# Patient Record
Sex: Male | Born: 1935 | Race: White | Hispanic: No | Marital: Married | Smoking: Never smoker
Health system: Southern US, Community
[De-identification: ages and names within clinical notes are randomized; demographics above are authoritative.]

## PROBLEM LIST (undated history)

## (undated) DIAGNOSIS — C4491 Basal cell carcinoma of skin, unspecified: Secondary | ICD-10-CM

## (undated) DIAGNOSIS — Z87442 Personal history of urinary calculi: Secondary | ICD-10-CM

## (undated) DIAGNOSIS — Z95810 Presence of automatic (implantable) cardiac defibrillator: Secondary | ICD-10-CM

## (undated) DIAGNOSIS — I63139 Cerebral infarction due to embolism of unspecified carotid artery: Secondary | ICD-10-CM

## (undated) DIAGNOSIS — R7303 Prediabetes: Secondary | ICD-10-CM

## (undated) DIAGNOSIS — E785 Hyperlipidemia, unspecified: Secondary | ICD-10-CM

## (undated) DIAGNOSIS — I420 Dilated cardiomyopathy: Secondary | ICD-10-CM

## (undated) DIAGNOSIS — I2510 Atherosclerotic heart disease of native coronary artery without angina pectoris: Secondary | ICD-10-CM

## (undated) DIAGNOSIS — I447 Left bundle-branch block, unspecified: Secondary | ICD-10-CM

## (undated) DIAGNOSIS — I252 Old myocardial infarction: Secondary | ICD-10-CM

## (undated) DIAGNOSIS — I10 Essential (primary) hypertension: Secondary | ICD-10-CM

## (undated) DIAGNOSIS — C9110 Chronic lymphocytic leukemia of B-cell type not having achieved remission: Secondary | ICD-10-CM

## (undated) HISTORY — PX: CORONARY ANGIOPLASTY: SHX604

## (undated) HISTORY — DX: Old myocardial infarction: I25.2

## (undated) HISTORY — DX: Essential (primary) hypertension: I10

## (undated) HISTORY — DX: Presence of automatic (implantable) cardiac defibrillator: Z95.810

## (undated) HISTORY — DX: Cerebral infarction due to embolism of unspecified carotid artery: I63.139

## (undated) HISTORY — PX: OTHER SURGICAL HISTORY: SHX169

## (undated) HISTORY — DX: Hyperlipidemia, unspecified: E78.5

## (undated) HISTORY — DX: Prediabetes: R73.03

## (undated) HISTORY — PX: JOINT REPLACEMENT: SHX530

## (undated) HISTORY — PX: CATARACT EXTRACTION W/ INTRAOCULAR LENS  IMPLANT, BILATERAL: SHX1307

## (undated) HISTORY — PX: REPLACEMENT TOTAL KNEE: SUR1224

## (undated) HISTORY — DX: Dilated cardiomyopathy: I42.0

## (undated) HISTORY — DX: Left bundle-branch block, unspecified: I44.7

## (undated) HISTORY — DX: Chronic lymphocytic leukemia of B-cell type not having achieved remission: C91.10

## (undated) HISTORY — PX: BASAL CELL CARCINOMA EXCISION: SHX1214

## (undated) HISTORY — DX: Atherosclerotic heart disease of native coronary artery without angina pectoris: I25.10

---

## 1999-12-20 ENCOUNTER — Encounter: Payer: Self-pay | Admitting: Orthopedic Surgery

## 1999-12-20 ENCOUNTER — Ambulatory Visit (HOSPITAL_COMMUNITY): Admission: RE | Admit: 1999-12-20 | Discharge: 1999-12-20 | Payer: Self-pay | Admitting: Orthopedic Surgery

## 1999-12-25 ENCOUNTER — Ambulatory Visit (HOSPITAL_COMMUNITY): Admission: RE | Admit: 1999-12-25 | Discharge: 1999-12-25 | Payer: Self-pay | Admitting: Orthopedic Surgery

## 2007-02-25 ENCOUNTER — Ambulatory Visit: Payer: Self-pay | Admitting: Cardiovascular Disease

## 2007-03-10 ENCOUNTER — Ambulatory Visit: Payer: Self-pay

## 2007-03-10 ENCOUNTER — Encounter: Payer: Self-pay | Admitting: Cardiovascular Disease

## 2009-09-14 DIAGNOSIS — C9110 Chronic lymphocytic leukemia of B-cell type not having achieved remission: Secondary | ICD-10-CM

## 2009-09-14 HISTORY — DX: Chronic lymphocytic leukemia of B-cell type not having achieved remission: C91.10

## 2009-09-20 ENCOUNTER — Ambulatory Visit: Payer: Self-pay | Admitting: Oncology

## 2009-09-26 ENCOUNTER — Other Ambulatory Visit: Admission: RE | Admit: 2009-09-26 | Discharge: 2009-09-26 | Payer: Self-pay | Admitting: Oncology

## 2009-09-26 LAB — CBC WITH DIFFERENTIAL/PLATELET
BASO%: 0.3 % (ref 0.0–2.0)
Basophils Absolute: 0.1 10*3/uL (ref 0.0–0.1)
EOS%: 1 % (ref 0.0–7.0)
Eosinophils Absolute: 0.2 10*3/uL (ref 0.0–0.5)
HCT: 37.7 % — ABNORMAL LOW (ref 38.4–49.9)
HGB: 13 g/dL (ref 13.0–17.1)
LYMPH%: 77.4 % — ABNORMAL HIGH (ref 14.0–49.0)
MCH: 32.8 pg (ref 27.2–33.4)
MCHC: 34.5 g/dL (ref 32.0–36.0)
MCV: 95.1 fL (ref 79.3–98.0)
MONO#: 0.5 10*3/uL (ref 0.1–0.9)
MONO%: 2.6 % (ref 0.0–14.0)
NEUT#: 3.5 10*3/uL (ref 1.5–6.5)
NEUT%: 18.7 % — ABNORMAL LOW (ref 39.0–75.0)
Platelets: 153 10*3/uL (ref 140–400)
RBC: 3.97 10*6/uL — ABNORMAL LOW (ref 4.20–5.82)
RDW: 12.8 % (ref 11.0–14.6)
WBC: 18.8 10*3/uL — ABNORMAL HIGH (ref 4.0–10.3)
lymph#: 14.5 10*3/uL — ABNORMAL HIGH (ref 0.9–3.3)

## 2009-09-26 LAB — CHCC SMEAR

## 2009-09-26 LAB — COMPREHENSIVE METABOLIC PANEL
AST: 22 U/L (ref 0–37)
BUN: 13 mg/dL (ref 6–23)
CO2: 27 mEq/L (ref 19–32)
Calcium: 8.3 mg/dL — ABNORMAL LOW (ref 8.4–10.5)
Chloride: 108 mEq/L (ref 96–112)
Creatinine, Ser: 0.88 mg/dL (ref 0.40–1.50)
Total Bilirubin: 0.6 mg/dL (ref 0.3–1.2)

## 2009-09-26 LAB — MORPHOLOGY
PLT EST: ADEQUATE
RBC Comments: NORMAL

## 2009-09-26 LAB — LACTATE DEHYDROGENASE: LDH: 128 U/L (ref 94–250)

## 2009-10-27 ENCOUNTER — Ambulatory Visit: Payer: Self-pay | Admitting: Oncology

## 2009-10-31 LAB — TECHNOLOGIST REVIEW

## 2009-10-31 LAB — COMPREHENSIVE METABOLIC PANEL
ALT: 18 U/L (ref 0–53)
AST: 22 U/L (ref 0–37)
Albumin: 3.5 g/dL (ref 3.5–5.2)
Alkaline Phosphatase: 67 U/L (ref 39–117)
BUN: 13 mg/dL (ref 6–23)
CO2: 27 mEq/L (ref 19–32)
Calcium: 8.5 mg/dL (ref 8.4–10.5)
Chloride: 109 mEq/L (ref 96–112)
Creatinine, Ser: 0.92 mg/dL (ref 0.40–1.50)
Glucose, Bld: 95 mg/dL (ref 70–99)
Potassium: 4.3 mEq/L (ref 3.5–5.3)
Sodium: 142 mEq/L (ref 135–145)
Total Bilirubin: 0.8 mg/dL (ref 0.3–1.2)
Total Protein: 5.7 g/dL — ABNORMAL LOW (ref 6.0–8.3)

## 2009-10-31 LAB — CBC WITH DIFFERENTIAL/PLATELET
BASO%: 0.2 % (ref 0.0–2.0)
Basophils Absolute: 0 10*3/uL (ref 0.0–0.1)
EOS%: 1.2 % (ref 0.0–7.0)
Eosinophils Absolute: 0.2 10*3/uL (ref 0.0–0.5)
HCT: 37.5 % — ABNORMAL LOW (ref 38.4–49.9)
HGB: 12.9 g/dL — ABNORMAL LOW (ref 13.0–17.1)
LYMPH%: 78.2 % — ABNORMAL HIGH (ref 14.0–49.0)
MCH: 33 pg (ref 27.2–33.4)
MCHC: 34.4 g/dL (ref 32.0–36.0)
MCV: 95.8 fL (ref 79.3–98.0)
MONO#: 0.6 10*3/uL (ref 0.1–0.9)
MONO%: 2.9 % (ref 0.0–14.0)
NEUT#: 3.4 10*3/uL (ref 1.5–6.5)
NEUT%: 17.5 % — ABNORMAL LOW (ref 39.0–75.0)
Platelets: 147 10*3/uL (ref 140–400)
RBC: 3.92 10*6/uL — ABNORMAL LOW (ref 4.20–5.82)
RDW: 12.6 % (ref 11.0–14.6)
WBC: 19.4 10*3/uL — ABNORMAL HIGH (ref 4.0–10.3)
lymph#: 15.2 10*3/uL — ABNORMAL HIGH (ref 0.9–3.3)

## 2009-10-31 LAB — LACTATE DEHYDROGENASE: LDH: 145 U/L (ref 94–250)

## 2009-12-28 ENCOUNTER — Ambulatory Visit: Payer: Self-pay | Admitting: Oncology

## 2010-01-02 LAB — CBC WITH DIFFERENTIAL/PLATELET
BASO%: 0.3 % (ref 0.0–2.0)
Basophils Absolute: 0.1 10*3/uL (ref 0.0–0.1)
EOS%: 0.5 % (ref 0.0–7.0)
Eosinophils Absolute: 0.1 10*3/uL (ref 0.0–0.5)
HCT: 40.8 % (ref 38.4–49.9)
HGB: 13.7 g/dL (ref 13.0–17.1)
LYMPH%: 82 % — ABNORMAL HIGH (ref 14.0–49.0)
MCH: 32.3 pg (ref 27.2–33.4)
MCHC: 33.5 g/dL (ref 32.0–36.0)
MCV: 96.3 fL (ref 79.3–98.0)
MONO#: 0.7 10*3/uL (ref 0.1–0.9)
MONO%: 2.9 % (ref 0.0–14.0)
NEUT#: 3.3 10*3/uL (ref 1.5–6.5)
NEUT%: 14.3 % — ABNORMAL LOW (ref 39.0–75.0)
Platelets: 146 10*3/uL (ref 140–400)
RBC: 4.24 10*6/uL (ref 4.20–5.82)
RDW: 12.9 % (ref 11.0–14.6)
WBC: 22.8 10*3/uL — ABNORMAL HIGH (ref 4.0–10.3)
lymph#: 18.7 10*3/uL — ABNORMAL HIGH (ref 0.9–3.3)

## 2010-02-23 ENCOUNTER — Ambulatory Visit: Payer: Self-pay | Admitting: Oncology

## 2010-02-27 LAB — CBC WITH DIFFERENTIAL/PLATELET
Eosinophils Absolute: 0.1 10*3/uL (ref 0.0–0.5)
HGB: 13.6 g/dL (ref 13.0–17.1)
MONO#: 0.8 10*3/uL (ref 0.1–0.9)
NEUT#: 4 10*3/uL (ref 1.5–6.5)
RBC: 4.14 10*6/uL — ABNORMAL LOW (ref 4.20–5.82)
RDW: 13 % (ref 11.0–14.6)
WBC: 25.1 10*3/uL — ABNORMAL HIGH (ref 4.0–10.3)

## 2010-02-27 LAB — COMPREHENSIVE METABOLIC PANEL
Albumin: 4 g/dL (ref 3.5–5.2)
CO2: 28 mEq/L (ref 19–32)
Calcium: 9.1 mg/dL (ref 8.4–10.5)
Chloride: 106 mEq/L (ref 96–112)
Glucose, Bld: 93 mg/dL (ref 70–99)
Potassium: 4.4 mEq/L (ref 3.5–5.3)
Sodium: 142 mEq/L (ref 135–145)
Total Protein: 6.1 g/dL (ref 6.0–8.3)

## 2010-02-27 LAB — LACTATE DEHYDROGENASE: LDH: 139 U/L (ref 94–250)

## 2010-04-27 ENCOUNTER — Ambulatory Visit: Payer: Self-pay | Admitting: Oncology

## 2010-05-01 LAB — CBC WITH DIFFERENTIAL/PLATELET
BASO%: 0.2 % (ref 0.0–2.0)
Basophils Absolute: 0.1 10*3/uL (ref 0.0–0.1)
EOS%: 0.8 % (ref 0.0–7.0)
Eosinophils Absolute: 0.2 10*3/uL (ref 0.0–0.5)
HCT: 41.1 % (ref 38.4–49.9)
HGB: 14.2 g/dL (ref 13.0–17.1)
LYMPH%: 81.1 % — ABNORMAL HIGH (ref 14.0–49.0)
MCH: 32.9 pg (ref 27.2–33.4)
MCHC: 34.5 g/dL (ref 32.0–36.0)
MCV: 95.3 fL (ref 79.3–98.0)
MONO#: 0.8 10*3/uL (ref 0.1–0.9)
MONO%: 3.1 % (ref 0.0–14.0)
NEUT#: 3.7 10*3/uL (ref 1.5–6.5)
NEUT%: 14.8 % — ABNORMAL LOW (ref 39.0–75.0)
Platelets: 170 10*3/uL (ref 140–400)
RBC: 4.31 10*6/uL (ref 4.20–5.82)
RDW: 13 % (ref 11.0–14.6)
WBC: 25 10*3/uL — ABNORMAL HIGH (ref 4.0–10.3)
lymph#: 20.3 10*3/uL — ABNORMAL HIGH (ref 0.9–3.3)

## 2010-06-26 ENCOUNTER — Encounter (HOSPITAL_BASED_OUTPATIENT_CLINIC_OR_DEPARTMENT_OTHER): Payer: Medicare Other | Admitting: Oncology

## 2010-06-26 ENCOUNTER — Other Ambulatory Visit: Payer: Self-pay | Admitting: Oncology

## 2010-06-26 DIAGNOSIS — D72829 Elevated white blood cell count, unspecified: Secondary | ICD-10-CM

## 2010-06-26 DIAGNOSIS — C9110 Chronic lymphocytic leukemia of B-cell type not having achieved remission: Secondary | ICD-10-CM

## 2010-06-26 LAB — COMPREHENSIVE METABOLIC PANEL
ALT: 18 U/L (ref 0–53)
Alkaline Phosphatase: 72 U/L (ref 39–117)
CO2: 28 mEq/L (ref 19–32)
Sodium: 142 mEq/L (ref 135–145)
Total Bilirubin: 0.5 mg/dL (ref 0.3–1.2)
Total Protein: 5.8 g/dL — ABNORMAL LOW (ref 6.0–8.3)

## 2010-06-26 LAB — CBC WITH DIFFERENTIAL/PLATELET
BASO%: 0.2 % (ref 0.0–2.0)
LYMPH%: 81.1 % — ABNORMAL HIGH (ref 14.0–49.0)
MCHC: 33.6 g/dL (ref 32.0–36.0)
MONO#: 0.7 10*3/uL (ref 0.1–0.9)
Platelets: 162 10*3/uL (ref 140–400)
RBC: 4.07 10*6/uL — ABNORMAL LOW (ref 4.20–5.82)
WBC: 23.3 10*3/uL — ABNORMAL HIGH (ref 4.0–10.3)

## 2010-06-26 LAB — LACTATE DEHYDROGENASE: LDH: 151 U/L (ref 94–250)

## 2010-08-29 NOTE — Assessment & Plan Note (Signed)
[facility]                            CARDIOLOGY OFFICE NOTE   Gregory, Rodgers                       MRN:          [id number]  DATE:02/25/2007                            DOB:          1928-03-30    SUBJECTIVE:  Gregory Rodgers is seen today at the request of Dr. Scarlett Presto in  [place], as well as Dr. Konrad Felix.  He needs preoperative clearance  for left knee surgery, which is scheduled for April 30, 2007.   Gregory Rodgers has been seen by Gregory Rodgers in the past.  In 1988, he had an anterior  wall myocardial infarction and had an angioplasty of a diagonal branch  by Dr. Everardo Beals. [place].  In 2002, was his last Myoview, which showed a  small apical defect, but no ischemia.  Since that time, he has not been  seen up here by Gregory Rodgers.  He has been doing well.  He is a Theme park manager.  He is active.  He has not had any significant chest pain, PND or  orthopnea.  No syncope or palpitations.   The patient needs left knee surgery.  He has significant osteoarthritis.  In talking to the patient, he really has no cardiopulmonary limitations.  He is active both on the farm, and he likes to hunt with his sons.   REVIEW OF SYSTEMS:  Is otherwise negative.   PAST MEDICAL HISTORY:  1. Remarkably benign. His past medical history is otherwise remarkable      for hypercholesterolemia, on therapy.  2. A previous anterior wall myocardial infarction in 1988.   SOCIAL HISTORY:  He is a previous smoker, quitting in 88.  He does not  drink.  He drinks two caffeinated beverages a day.  As indicated, he is  a Theme park manager.  He walks two to three miles a day.  The patient  enjoys hunting.   PAST SURGICAL HISTORY:  His only past surgical history has included  cataract surgery.   FAMILY HISTORY:  His mother died at age 47, of congestive heart failure.  His father died at age 23, of a heart attack.   CURRENT MEDICATIONS:  1. Simvastatin 10 mg daily.  2. Dietary supplement for vision  formula.  3. Aspirin.  4. Fish oil.  5. Omega-3 vitamins.   ALLERGIES:  No known drug allergies.   PHYSICAL EXAMINATION:  GENERAL:  A healthy-appearing elderly white male,  in no distress.  VITAL SIGNS:  Weight 185 pounds, blood pressure 150/76, pulse 56 and  regular, respirations 14, afebrile.  HEENT:  Unremarkable.  NECK:  Carotids normal without bruit.  No lymphadenopathy, thyromegaly  or JVP elevation.  LUNGS:  Clear with good diaphragmatic motion.  No wheezing.  HEART:  S1 and S2.  No murmur, rub, gallop or click.  PMI is normal.  ABDOMEN:  Benign.  Bowel sounds positive.  No tenderness, no  hepatosplenomegaly, no hepatojugular reflux.  EXTREMITIES:  Distal pulses intact.  No edema.  NEUROLOGIC:  Nonfocal.  SKIN:  Warm and dry.  MUSCULOSKELETAL:  No muscular weakness.   Electrocardiogram shows a left bundle branch block.  It  is new since our  electrocardiogram from 2002.   IMPRESSION/PLAN:  1. Elderly white male with a history of an anterior wall myocardial      infarction, diagonal angioplasty:  No workup in over six years.  An      electrocardiogram is different from that seen in 2002.  Despite his      activity level, I think he needs an adenosine Myoview to clear him      for surgery.  We will schedule this.  2. New left bundle branch block, likely resulting from primary      electrical problem:  No evidence of a high-grade atrioventricular      block.  Not on atrioventricular nodal blocking drugs.  No syncope.      Check a 2-D echocardiogram to make sure this is not representing a      cardiomyopathy.  Reassess his LV function prior to surgery.  3. Osteoarthritis of the left knee:  Probably will be cleared for      surgery with Dr. Scarlett Presto in [place]; but particularly since we      are not there, and it is a small hospital, I would like to make      sure his adenosine Myoview and echocardiogram are low risk before      we clear him for surgery on April 30, 2007.  4. Hypercholesterolemia in the setting of a previous myocardial      infarction:  Follow up with Dr. Konrad Felix in regards to lipid and      liver profile.  Continue Simvastatin 10 mg daily.   Overall, I suspect that Kanton will be able to be cleared for surgery.  Further recommendations will be based on the results of his stress test  and echocardiogram.     Theron Arista C. Eden Emms, MD, [facility]  Electronically Signed    PCN/MedQ  DD: 02/25/2007  DT: 02/25/2007  Job #: [id number]

## 2010-10-13 ENCOUNTER — Other Ambulatory Visit: Payer: Self-pay | Admitting: Oncology

## 2010-10-13 ENCOUNTER — Encounter (HOSPITAL_BASED_OUTPATIENT_CLINIC_OR_DEPARTMENT_OTHER): Payer: Medicare Other | Admitting: Oncology

## 2010-10-13 DIAGNOSIS — C9110 Chronic lymphocytic leukemia of B-cell type not having achieved remission: Secondary | ICD-10-CM

## 2010-10-13 LAB — CBC WITH DIFFERENTIAL/PLATELET
BASO%: 0.2 % (ref 0.0–2.0)
EOS%: 0.9 % (ref 0.0–7.0)
HCT: 37.3 % — ABNORMAL LOW (ref 38.4–49.9)
LYMPH%: 84.1 % — ABNORMAL HIGH (ref 14.0–49.0)
MCH: 33.3 pg (ref 27.2–33.4)
MCHC: 34.6 g/dL (ref 32.0–36.0)
MCV: 96.3 fL (ref 79.3–98.0)
MONO%: 1.9 % (ref 0.0–14.0)
NEUT%: 12.9 % — ABNORMAL LOW (ref 39.0–75.0)
lymph#: 19 10*3/uL — ABNORMAL HIGH (ref 0.9–3.3)

## 2011-01-01 ENCOUNTER — Other Ambulatory Visit: Payer: Self-pay | Admitting: Oncology

## 2011-01-01 ENCOUNTER — Encounter (HOSPITAL_BASED_OUTPATIENT_CLINIC_OR_DEPARTMENT_OTHER): Payer: Medicare Other | Admitting: Oncology

## 2011-01-01 DIAGNOSIS — C9110 Chronic lymphocytic leukemia of B-cell type not having achieved remission: Secondary | ICD-10-CM

## 2011-01-01 DIAGNOSIS — E785 Hyperlipidemia, unspecified: Secondary | ICD-10-CM

## 2011-01-01 DIAGNOSIS — D696 Thrombocytopenia, unspecified: Secondary | ICD-10-CM

## 2011-01-01 DIAGNOSIS — I10 Essential (primary) hypertension: Secondary | ICD-10-CM

## 2011-01-01 LAB — CBC WITH DIFFERENTIAL/PLATELET
BASO%: 0.2 % (ref 0.0–2.0)
EOS%: 0.4 % (ref 0.0–7.0)
HGB: 13.7 g/dL (ref 13.0–17.1)
MCH: 33.8 pg — ABNORMAL HIGH (ref 27.2–33.4)
MCHC: 34.7 g/dL (ref 32.0–36.0)
MCV: 97.4 fL (ref 79.3–98.0)
MONO%: 2.5 % (ref 0.0–14.0)
RBC: 4.06 10*6/uL — ABNORMAL LOW (ref 4.20–5.82)
RDW: 12.9 % (ref 11.0–14.6)
lymph#: 20.3 10*3/uL — ABNORMAL HIGH (ref 0.9–3.3)

## 2011-01-01 LAB — COMPREHENSIVE METABOLIC PANEL
AST: 21 U/L (ref 0–37)
Albumin: 4 g/dL (ref 3.5–5.2)
Alkaline Phosphatase: 63 U/L (ref 39–117)
Potassium: 4.7 mEq/L (ref 3.5–5.3)
Sodium: 138 mEq/L (ref 135–145)
Total Bilirubin: 0.5 mg/dL (ref 0.3–1.2)
Total Protein: 5.9 g/dL — ABNORMAL LOW (ref 6.0–8.3)

## 2011-01-01 LAB — TECHNOLOGIST REVIEW

## 2011-03-30 ENCOUNTER — Other Ambulatory Visit: Payer: Self-pay | Admitting: Oncology

## 2011-03-30 ENCOUNTER — Other Ambulatory Visit (HOSPITAL_BASED_OUTPATIENT_CLINIC_OR_DEPARTMENT_OTHER): Payer: Medicare Other | Admitting: Lab

## 2011-03-30 ENCOUNTER — Telehealth: Payer: Self-pay | Admitting: *Deleted

## 2011-03-30 DIAGNOSIS — C9110 Chronic lymphocytic leukemia of B-cell type not having achieved remission: Secondary | ICD-10-CM

## 2011-03-30 LAB — CBC WITH DIFFERENTIAL/PLATELET
BASO%: 0.4 % (ref 0.0–2.0)
EOS%: 0.4 % (ref 0.0–7.0)
LYMPH%: 85.8 % — ABNORMAL HIGH (ref 14.0–49.0)
MCH: 33.2 pg (ref 27.2–33.4)
MCHC: 34 g/dL (ref 32.0–36.0)
MONO#: 0.2 10*3/uL (ref 0.1–0.9)
RBC: 4.14 10*6/uL — ABNORMAL LOW (ref 4.20–5.82)
WBC: 26.4 10*3/uL — ABNORMAL HIGH (ref 4.0–10.3)
lymph#: 22.6 10*3/uL — ABNORMAL HIGH (ref 0.9–3.3)

## 2011-03-30 LAB — TECHNOLOGIST REVIEW

## 2011-03-30 NOTE — Telephone Encounter (Signed)
Called pt to give results of labs per Dr. Lodema Pilot message.  No answer.  I left VM for pt to return call on Monday.

## 2011-03-30 NOTE — Telephone Encounter (Signed)
Message copied by Wende Mott on Fri Mar 30, 2011  5:09 PM ------      Message from: Jethro Bolus T      Created: Fri Mar 30, 2011  2:02 PM       Please call patient.  His WBC is stable (not much change).  This is from his chronic leukemia.  Still no symptoms; continue observation.  I'll put in a POF for him to see me in March 2013.  Thanks.

## 2011-06-20 ENCOUNTER — Encounter: Payer: Self-pay | Admitting: *Deleted

## 2011-06-20 DIAGNOSIS — I2510 Atherosclerotic heart disease of native coronary artery without angina pectoris: Secondary | ICD-10-CM

## 2011-06-20 DIAGNOSIS — Z9862 Peripheral vascular angioplasty status: Secondary | ICD-10-CM | POA: Insufficient documentation

## 2011-06-20 DIAGNOSIS — E785 Hyperlipidemia, unspecified: Secondary | ICD-10-CM | POA: Insufficient documentation

## 2011-06-20 DIAGNOSIS — R7303 Prediabetes: Secondary | ICD-10-CM | POA: Insufficient documentation

## 2011-06-20 DIAGNOSIS — I10 Essential (primary) hypertension: Secondary | ICD-10-CM

## 2011-06-20 DIAGNOSIS — C9110 Chronic lymphocytic leukemia of B-cell type not having achieved remission: Secondary | ICD-10-CM | POA: Insufficient documentation

## 2011-06-21 ENCOUNTER — Encounter: Payer: Self-pay | Admitting: Oncology

## 2011-06-27 ENCOUNTER — Other Ambulatory Visit (HOSPITAL_BASED_OUTPATIENT_CLINIC_OR_DEPARTMENT_OTHER): Payer: Medicare Other | Admitting: Lab

## 2011-06-27 ENCOUNTER — Ambulatory Visit (HOSPITAL_BASED_OUTPATIENT_CLINIC_OR_DEPARTMENT_OTHER): Payer: Medicare Other | Admitting: Oncology

## 2011-06-27 VITALS — BP 154/68 | HR 60 | Temp 96.9°F | Ht 70.5 in | Wt 185.1 lb

## 2011-06-27 DIAGNOSIS — D72829 Elevated white blood cell count, unspecified: Secondary | ICD-10-CM

## 2011-06-27 DIAGNOSIS — I10 Essential (primary) hypertension: Secondary | ICD-10-CM

## 2011-06-27 DIAGNOSIS — C9110 Chronic lymphocytic leukemia of B-cell type not having achieved remission: Secondary | ICD-10-CM

## 2011-06-27 DIAGNOSIS — D649 Anemia, unspecified: Secondary | ICD-10-CM

## 2011-06-27 DIAGNOSIS — D696 Thrombocytopenia, unspecified: Secondary | ICD-10-CM

## 2011-06-27 LAB — CBC WITH DIFFERENTIAL/PLATELET
Basophils Absolute: 0.1 10*3/uL (ref 0.0–0.1)
Eosinophils Absolute: 0.1 10*3/uL (ref 0.0–0.5)
HGB: 12.8 g/dL — ABNORMAL LOW (ref 13.0–17.1)
LYMPH%: 83.6 % — ABNORMAL HIGH (ref 14.0–49.0)
MCV: 98.5 fL — ABNORMAL HIGH (ref 79.3–98.0)
MONO%: 2.1 % (ref 0.0–14.0)
NEUT#: 3 10*3/uL (ref 1.5–6.5)
NEUT%: 13.7 % — ABNORMAL LOW (ref 39.0–75.0)
Platelets: 120 10*3/uL — ABNORMAL LOW (ref 140–400)
RBC: 3.86 10*6/uL — ABNORMAL LOW (ref 4.20–5.82)

## 2011-06-27 LAB — COMPREHENSIVE METABOLIC PANEL
ALT: 15 U/L (ref 0–53)
Alkaline Phosphatase: 68 U/L (ref 39–117)
Creatinine, Ser: 1 mg/dL (ref 0.50–1.35)
Sodium: 141 mEq/L (ref 135–145)
Total Bilirubin: 0.6 mg/dL (ref 0.3–1.2)
Total Protein: 5.9 g/dL — ABNORMAL LOW (ref 6.0–8.3)

## 2011-06-27 NOTE — Progress Notes (Signed)
Gregory Rodgers OFFICE PROGRESS NOTE  Cc:  Gregory Rodgers, GregoryD., Gregory Rodgers, Gregory Rodgers, [place]  DIAGNOSIS:  CLL, stage 0.  CURRENT THERAPY:  watchful observation  INTERVAL HISTORY: Gregory Rodgers 78 y.o. male returns for regular follow up with his wife. He reports feeling well.  He works many chores around the house without fatigue.  Patient denies headache, visual changes, confusion, drenching night sweats, palpable lymph node swelling, mucositis, odynophagia, dysphagia, nausea vomiting, jaundice, chest pain, palpitation, shortness of breath, dyspnea on exertion, productive cough, gum bleeding, epistaxis, hematemesis, hemoptysis, abdominal pain, abdominal swelling, early satiety, melena, hematochezia, hematuria, skin rash, spontaneous bleeding, joint swelling, joint pain, heat or cold intolerance, bowel bladder incontinence, back pain, focal motor weakness, paresthesia, depression, suicidal or homocidal ideation, feeling hopelessness.   Past Medical History  Diagnosis Date  . Hyperlipidemia   . Borderline diabetes   . HTN (hypertension)   . Coronary artery disease   . CLL (chronic lymphocytic leukemia) 09/2009    stage 0; on observation    Past Surgical History  Procedure Date  . Replacement total knee     Current Outpatient Prescriptions  Medication Sig Dispense Refill  . aspirin 81 MG tablet Take 81 mg by mouth daily.      . fish oil-omega-3 fatty acids 1000 MG capsule Take 2 g by mouth daily.      . hydrochlorothiazide (MICROZIDE) 12.5 MG capsule Take 12.5 mg by mouth every other day.      . Multiple Vitamin (MULTIVITAMIN) tablet Take 1 tablet by mouth daily.      . simvastatin (ZOCOR) 10 MG tablet Take 10 mg by mouth at bedtime.        ALLERGIES:   has no known allergies.  REVIEW OF SYSTEMS:  The rest of the 14-point review of system was negative.   Filed Vitals:   06/27/11 1318  BP: 154/68  Pulse: 60  Temp: 96.9 F (36.1 C)   Wt  Readings from Last 3 Encounters:  06/27/11 185 lb 1.6 oz (83.961 kg)  01/01/11 187 lb 6.4 oz (85.004 kg)   ECOG Performance status: 0  PHYSICAL EXAMINATION:   General:  well-nourished in no acute distress.  Eyes:  no scleral icterus.  ENT:  There were no oropharyngeal lesions.  Neck was without thyromegaly.  Lymphatics:  Negative cervical, supraclavicular or axillary adenopathy.  Respiratory: lungs were clear bilaterally without wheezing or crackles.  Cardiovascular:  Regular rate and rhythm, S1/S2, without murmur, rub or gallop.  There was no pedal edema.  GI:  abdomen was soft, flat, nontender, nondistended, without organomegaly.  Muscoloskeletal:  no spinal tenderness of palpation of vertebral spine.  Skin exam was without echymosis, petichae.  Neuro exam was nonfocal.  Patient was able to get on and off exam table without assistance.  Gait was normal.  Patient was alerted and oriented.  Attention was good.   Language was appropriate.  Mood was normal without depression.  Speech was not pressured.  Thought content was not tangential.     LABORATORY/RADIOLOGY DATA:  Lab Results  Component Value Date   WBC 21.9* 06/27/2011   HGB 12.8* 06/27/2011   HCT 38.1* 06/27/2011   PLT 120* 06/27/2011   GLUCOSE 103* 01/01/2011   ALT 16 01/01/2011   AST 21 01/01/2011   NA 138 01/01/2011   K 4.7 01/01/2011   CL 105 01/01/2011   CREATININE 0.95 01/01/2011   BUN 19 01/01/2011   CO2 27 01/01/2011  ASSESSMENT AND PLAN:  1. CLL:  I discussed [name] and his family members again that he has no B-symptoms; recurrent infection or severe adenopathy. He has slight anemia and thrombocytopenia.  There is still no strong indication for chemo.   2. Thrombocytopenia and slight anemia:  Most likely due to CLL.  However, they are very mild.  In the future, if he develops worsening anemia and thrombocytopenia, I may consider an empiric trial of Prednisone in case he has autoimmune process related to his CLL.    3. Hypertension:  Still slightly elevated systolic blood pressure.  He is on HCTZ.  I defer to PCP. 4. Hyperlipidemia:  He is on fish oil and simvastatin per PCP.    5. Follow up:  Lab in 2 and 4 months.  Lab/RV with me in about 6 months.

## 2011-08-27 ENCOUNTER — Telehealth: Payer: Self-pay | Admitting: *Deleted

## 2011-08-27 ENCOUNTER — Other Ambulatory Visit (HOSPITAL_BASED_OUTPATIENT_CLINIC_OR_DEPARTMENT_OTHER): Payer: Medicare Other

## 2011-08-27 DIAGNOSIS — C9110 Chronic lymphocytic leukemia of B-cell type not having achieved remission: Secondary | ICD-10-CM

## 2011-08-27 LAB — CBC WITH DIFFERENTIAL/PLATELET
BASO%: 0.1 % (ref 0.0–2.0)
Basophils Absolute: 0 10*3/uL (ref 0.0–0.1)
EOS%: 0.9 % (ref 0.0–7.0)
HCT: 37.9 % — ABNORMAL LOW (ref 38.4–49.9)
HGB: 12.8 g/dL — ABNORMAL LOW (ref 13.0–17.1)
LYMPH%: 80.5 % — ABNORMAL HIGH (ref 14.0–49.0)
MCH: 33.2 pg (ref 27.2–33.4)
MCHC: 33.8 g/dL (ref 32.0–36.0)
MONO#: 0.5 10*3/uL (ref 0.1–0.9)
NEUT%: 16.1 % — ABNORMAL LOW (ref 39.0–75.0)
Platelets: 133 10*3/uL — ABNORMAL LOW (ref 140–400)

## 2011-08-27 NOTE — Telephone Encounter (Signed)
Message copied by Wende Mott on Mon Aug 27, 2011  2:41 PM ------      Message from: HA, Raliegh Ip T      Created: Mon Aug 27, 2011  2:08 PM       Please call pt.  His CLL is still stable.  Just elevated WBC.  Otherwise, Hgb and platelet count are stable.  Continue observation.  Thanks.

## 2011-08-27 NOTE — Telephone Encounter (Signed)
Left VM for pt to call back for lab results. 

## 2011-10-25 ENCOUNTER — Other Ambulatory Visit: Payer: Medicare Other

## 2011-12-30 NOTE — Patient Instructions (Addendum)
1.  Diagnosis:  CLL (chronic lymphoid leukemia); stage 0 since no active disease, no symptoms; normal blood count. 2.  Recommendation: watchful observation. Lab only appointment in  about 4 months and visit with [facility] in about 8 months.

## 2011-12-31 ENCOUNTER — Other Ambulatory Visit (HOSPITAL_BASED_OUTPATIENT_CLINIC_OR_DEPARTMENT_OTHER): Payer: Medicare Other | Admitting: Lab

## 2011-12-31 ENCOUNTER — Telehealth: Payer: Self-pay | Admitting: Oncology

## 2011-12-31 ENCOUNTER — Ambulatory Visit (HOSPITAL_BASED_OUTPATIENT_CLINIC_OR_DEPARTMENT_OTHER): Payer: Medicare Other | Admitting: Oncology

## 2011-12-31 VITALS — BP 148/67 | HR 57 | Temp 96.8°F | Resp 20 | Ht 70.5 in | Wt 178.0 lb

## 2011-12-31 DIAGNOSIS — C9110 Chronic lymphocytic leukemia of B-cell type not having achieved remission: Secondary | ICD-10-CM

## 2011-12-31 LAB — COMPREHENSIVE METABOLIC PANEL (CC13)
Albumin: 3.6 g/dL (ref 3.5–5.0)
Alkaline Phosphatase: 72 U/L (ref 40–150)
BUN: 18 mg/dL (ref 7.0–26.0)
CO2: 26 mEq/L (ref 22–29)
Calcium: 8.9 mg/dL (ref 8.4–10.4)
Chloride: 108 mEq/L — ABNORMAL HIGH (ref 98–107)
Glucose: 86 mg/dl (ref 70–99)
Potassium: 4.9 mEq/L (ref 3.5–5.1)
Sodium: 140 mEq/L (ref 136–145)
Total Protein: 5.9 g/dL — ABNORMAL LOW (ref 6.4–8.3)

## 2011-12-31 LAB — CBC & DIFF AND RETIC
Basophils Absolute: 0 10*3/uL (ref 0.0–0.1)
EOS%: 0.5 % (ref 0.0–7.0)
Eosinophils Absolute: 0.1 10*3/uL (ref 0.0–0.5)
HGB: 13.4 g/dL (ref 13.0–17.1)
LYMPH%: 85.6 % — ABNORMAL HIGH (ref 14.0–49.0)
MCH: 32.6 pg (ref 27.2–33.4)
MCV: 99.5 fL — ABNORMAL HIGH (ref 79.3–98.0)
MONO%: 3.7 % (ref 0.0–14.0)
NEUT#: 2.5 10*3/uL (ref 1.5–6.5)
Platelets: 140 10*3/uL (ref 140–400)
RDW: 12.7 % (ref 11.0–14.6)
Retic Ct Abs: 50.96 10*3/uL (ref 34.80–93.90)

## 2011-12-31 LAB — TECHNOLOGIST REVIEW

## 2011-12-31 NOTE — Progress Notes (Signed)
[facility]  Telephone:([phone] Fax:(336) [phone]   OFFICE PROGRESS NOTE  DIAGNOSIS: CLL, stage 0.   CURRENT THERAPY: watchful observation  INTERVAL HISTORY: Gregory Rodgers 78 y.o. male returns for regular follow up with his wife.  He reports feeling well. He recently bought a bike and has been biking a few times a week.    Patient denies fever, anorexia, weight loss, fatigue, headache, visual changes, confusion, drenching night sweats, palpable lymph node swelling, mucositis, odynophagia, dysphagia, nausea vomiting, jaundice, chest pain, palpitation, shortness of breath, dyspnea on exertion, productive cough, gum bleeding, epistaxis, hematemesis, hemoptysis, abdominal pain, abdominal swelling, early satiety, melena, hematochezia, hematuria, skin rash, spontaneous bleeding, joint swelling, joint pain, heat or cold intolerance, bowel bladder incontinence, back pain, focal motor weakness, paresthesia, depression, suicidal or homicidal ideation, feeling hopelessness.   Past Medical History  Diagnosis Date  . Hyperlipidemia   . Borderline diabetes   . HTN (hypertension)   . Coronary artery disease   . CLL (chronic lymphocytic leukemia) 09/2009    stage 0; on observation    Past Surgical History  Procedure Date  . Replacement total knee     Current Outpatient Prescriptions  Medication Sig Dispense Refill  . aspirin 81 MG tablet Take 81 mg by mouth daily.      Marland Kitchen co-enzyme Q-10 30 MG capsule Take 30 mg by mouth daily.      . fish oil-omega-3 fatty acids 1000 MG capsule Take 2 g by mouth daily.      . Multiple Vitamin (MULTIVITAMIN) tablet Take 1 tablet by mouth daily.      . simvastatin (ZOCOR) 10 MG tablet Take 10 mg by mouth at bedtime.        ALLERGIES:   has no known allergies.  REVIEW OF SYSTEMS:  The rest of the 14-point review of system was negative.   Filed Vitals:   12/31/11 0941  BP: 148/67  Pulse: 57  Temp: 96.8 F (36 C)  Resp: 20   Wt  Readings from Last 3 Encounters:  12/31/11 178 lb (80.74 kg)  06/27/11 185 lb 1.6 oz (83.961 kg)  01/01/11 187 lb 6.4 oz (85.004 kg)   ECOG Performance status: 0  PHYSICAL EXAMINATION:   General:  well-nourished man, in no acute distress.  Eyes:  no scleral icterus.  ENT:  There were no oropharyngeal lesions.  Neck was without thyromegaly.  Lymphatics:  Negative cervical, supraclavicular or axillary adenopathy.  Respiratory: lungs were clear bilaterally without wheezing or crackles.  Cardiovascular:  Regular rate and rhythm, S1/S2, without murmur, rub or gallop.  There was no pedal edema.  GI:  abdomen was soft, flat, nontender, nondistended, without organomegaly.  Muscoloskeletal:  no spinal tenderness of palpation of vertebral spine.  Skin exam was without echymosis, petichae.  Neuro exam was nonfocal.  Patient was able to get on and off exam table without assistance.  Gait was normal.  Patient was alerted and oriented.  Attention was good.   Language was appropriate.  Mood was normal without depression.  Speech was not pressured.  Thought content was not tangential.     LABORATORY/RADIOLOGY DATA:  Lab Results  Component Value Date   WBC 24.3* 12/31/2011   HGB 13.4 12/31/2011   HCT 40.9 12/31/2011   PLT 140 12/31/2011   GLUCOSE 86 12/31/2011   ALKPHOS 72 12/31/2011   ALT 14 12/31/2011   AST 18 12/31/2011   NA 140 12/31/2011   K 4.9 12/31/2011   CL 108*  12/31/2011   CREATININE 0.9 12/31/2011   BUN 18.0 12/31/2011   CO2 26 12/31/2011    ASSESSMENT AND PLAN:   1. CLL: I discussed Mr Arizpe and his family members again that he has no B-symptoms; recurrent infection or severe adenopathy. He has slight anemia and thrombocytopenia. There is still no strong indication for chemo.  2. Thrombocytopenia and slight anemia: this have resolved.   3. Hypertension:  He is on diet control, and exercise program per PCP.  4. Hyperlipidemia: He is on fish oil and simvastatin per PCP.  5. Follow up:  As his  disease is very stable, Lab only appointment in  about 4 months and visit with [facility] Hire in about 8 months.    The length of time of the face-to-face encounter was 10  minutes. More than 50% of time was spent counseling and coordination of care.

## 2011-12-31 NOTE — Telephone Encounter (Signed)
Appts made and printed for pt, Lab and return visit in about a month an error Per Dr. Gaylyn Rong appt is in 8 mths.

## 2012-01-28 ENCOUNTER — Ambulatory Visit: Payer: Medicare Other | Admitting: Oncology

## 2012-01-28 ENCOUNTER — Other Ambulatory Visit: Payer: Medicare Other | Admitting: Lab

## 2012-04-28 ENCOUNTER — Other Ambulatory Visit (HOSPITAL_BASED_OUTPATIENT_CLINIC_OR_DEPARTMENT_OTHER): Payer: Medicare Other | Admitting: Lab

## 2012-04-28 DIAGNOSIS — C9110 Chronic lymphocytic leukemia of B-cell type not having achieved remission: Secondary | ICD-10-CM

## 2012-04-28 LAB — TECHNOLOGIST REVIEW

## 2012-04-28 LAB — CBC WITH DIFFERENTIAL/PLATELET
Basophils Absolute: 0 10*3/uL (ref 0.0–0.1)
Eosinophils Absolute: 0.1 10*3/uL (ref 0.0–0.5)
HGB: 12.6 g/dL — ABNORMAL LOW (ref 13.0–17.1)
MCV: 98.7 fL — ABNORMAL HIGH (ref 79.3–98.0)
MONO%: 4.4 % (ref 0.0–14.0)
NEUT#: 2.2 10*3/uL (ref 1.5–6.5)
RBC: 3.85 10*6/uL — ABNORMAL LOW (ref 4.20–5.82)
RDW: 12.7 % (ref 11.0–14.6)
WBC: 25.2 10*3/uL — ABNORMAL HIGH (ref 4.0–10.3)
lymph#: 21.7 10*3/uL — ABNORMAL HIGH (ref 0.9–3.3)
nRBC: 0 % (ref 0–0)

## 2012-08-25 ENCOUNTER — Encounter: Payer: Self-pay | Admitting: Oncology

## 2012-08-25 ENCOUNTER — Telehealth: Payer: Self-pay | Admitting: Oncology

## 2012-08-25 ENCOUNTER — Other Ambulatory Visit (HOSPITAL_BASED_OUTPATIENT_CLINIC_OR_DEPARTMENT_OTHER): Payer: Medicare Other | Admitting: Lab

## 2012-08-25 ENCOUNTER — Ambulatory Visit (HOSPITAL_BASED_OUTPATIENT_CLINIC_OR_DEPARTMENT_OTHER): Payer: Medicare Other | Admitting: Oncology

## 2012-08-25 VITALS — BP 153/73 | HR 49 | Temp 97.7°F | Resp 18 | Ht 70.0 in | Wt 179.9 lb

## 2012-08-25 DIAGNOSIS — C9110 Chronic lymphocytic leukemia of B-cell type not having achieved remission: Secondary | ICD-10-CM

## 2012-08-25 DIAGNOSIS — D649 Anemia, unspecified: Secondary | ICD-10-CM

## 2012-08-25 DIAGNOSIS — D696 Thrombocytopenia, unspecified: Secondary | ICD-10-CM

## 2012-08-25 DIAGNOSIS — I10 Essential (primary) hypertension: Secondary | ICD-10-CM

## 2012-08-25 LAB — CBC WITH DIFFERENTIAL/PLATELET
BASO%: 0.2 % (ref 0.0–2.0)
Basophils Absolute: 0.1 10*3/uL (ref 0.0–0.1)
EOS%: 0.7 % (ref 0.0–7.0)
HGB: 12.2 g/dL — ABNORMAL LOW (ref 13.0–17.1)
MCH: 32.7 pg (ref 27.2–33.4)
MCHC: 33.1 g/dL (ref 32.0–36.0)
MONO#: 0.5 10*3/uL (ref 0.1–0.9)
RDW: 13.1 % (ref 11.0–14.6)
WBC: 25 10*3/uL — ABNORMAL HIGH (ref 4.0–10.3)
lymph#: 22.4 10*3/uL — ABNORMAL HIGH (ref 0.9–3.3)

## 2012-08-25 LAB — COMPREHENSIVE METABOLIC PANEL (CC13)
ALT: 16 U/L (ref 0–55)
AST: 19 U/L (ref 5–34)
Albumin: 3.2 g/dL — ABNORMAL LOW (ref 3.5–5.0)
CO2: 25 mEq/L (ref 22–29)
Chloride: 108 mEq/L — ABNORMAL HIGH (ref 98–107)
Potassium: 4.6 mEq/L (ref 3.5–5.1)
Total Protein: 5.8 g/dL — ABNORMAL LOW (ref 6.4–8.3)

## 2012-08-25 LAB — TECHNOLOGIST REVIEW

## 2012-08-25 NOTE — Telephone Encounter (Signed)
, °

## 2012-08-25 NOTE — Progress Notes (Signed)
Gregory Rodgers  [phone] [phone]   OFFICE PROGRESS NOTE  DIAGNOSIS: CLL, stage 0.   CURRENT THERAPY: watchful observation  INTERVAL HISTORY: Gregory Rodgers 79 y.o. male returns for regular follow up with his wife.  He reports feeling well. He works in his garden and bikes regularly.   Patient denies fever, anorexia, weight loss, fatigue, headache, visual changes, confusion, drenching night sweats, palpable lymph node swelling, mucositis, odynophagia, dysphagia, nausea vomiting, jaundice, chest pain, palpitation, shortness of breath, dyspnea on exertion, productive cough, gum bleeding, epistaxis, hematemesis, hemoptysis, abdominal pain, abdominal swelling, early satiety, melena, hematochezia, hematuria, skin rash, spontaneous bleeding, joint swelling, joint pain, heat or cold intolerance, bowel bladder incontinence, back pain, focal motor weakness, paresthesia, depression, suicidal or homicidal ideation, feeling hopelessness.   Past Medical History  Diagnosis Date  . Hyperlipidemia   . Borderline diabetes   . HTN (hypertension)   . Coronary artery disease   . CLL (chronic lymphocytic leukemia) 09/2009    stage 0; on observation    Past Surgical History  Procedure Laterality Date  . Replacement total knee      Current Outpatient Prescriptions  Medication Sig Dispense Refill  . aspirin 81 MG tablet Take 81 mg by mouth daily.      Marland Kitchen co-enzyme Q-10 30 MG capsule Take 30 mg by mouth daily.      . fish oil-omega-3 fatty acids 1000 MG capsule Take 1 g by mouth daily.       Marland Kitchen lisinopril (PRINIVIL,ZESTRIL) 5 MG tablet Take 5 mg by mouth daily.      . Multiple Vitamin (MULTIVITAMIN) tablet Take 1 tablet by mouth daily.       No current facility-administered medications for this visit.    ALLERGIES:  has No Known Allergies.  REVIEW OF SYSTEMS:  The rest of the 14-point review of system was negative.   Filed Vitals:   08/25/12 1133  BP:  153/73  Pulse: 49  Temp: 97.7 F (36.5 C)  Resp: 18   Wt Readings from Last 3 Encounters:  08/25/12 179 lb 14.4 oz (81.602 kg)  12/31/11 178 lb (80.74 kg)  06/27/11 185 lb 1.6 oz (83.961 kg)   ECOG Performance status: 0  PHYSICAL EXAMINATION:   General:  well-nourished man, in no acute distress.  Eyes:  no scleral icterus.  ENT:  There were no oropharyngeal lesions.  Neck was without thyromegaly.  Lymphatics:  Negative cervical, supraclavicular or axillary adenopathy.  Respiratory: lungs were clear bilaterally without wheezing or crackles.  Cardiovascular:  Regular rate and rhythm, S1/S2, without murmur, rub or gallop.  There was no pedal edema.  GI:  abdomen was soft, flat, nontender, nondistended, without organomegaly.  Muscoloskeletal:  no spinal tenderness of palpation of vertebral spine.  Skin exam was without echymosis, petichae.  Neuro exam was nonfocal.  Patient was able to get on and off exam table without assistance.  Gait was normal.  Patient was alerted and oriented.  Attention was good.   Language was appropriate.  Mood was normal without depression.  Speech was not pressured.  Thought content was not tangential.     LABORATORY/RADIOLOGY DATA:  Lab Results  Component Value Date   WBC 25.0* 08/25/2012   HGB 12.2* 08/25/2012   HCT 36.7* 08/25/2012   PLT 124* 08/25/2012   GLUCOSE 91 08/25/2012   ALKPHOS 75 08/25/2012   ALT 16 08/25/2012   AST 19 08/25/2012   NA 140 08/25/2012   K 4.6 08/25/2012  CL 108* 08/25/2012   CREATININE 1.0 08/25/2012   BUN 25.6 08/25/2012   CO2 25 08/25/2012    ASSESSMENT AND PLAN:   1. CLL: I discussed [name] and his wife again that he has no B-symptoms; recurrent infection or severe adenopathy. He has slight anemia and thrombocytopenia. There is still no strong indication for chemo.  2. Thrombocytopenia and slight anemia: These remain stable.   3. Hypertension:  On Lisinopril per PCP.  4. Hyperlipidemia: He is on fish oil per PCP.  5. Follow up:   As his disease is very stable, Lab only appointment in  about 4 months and visit in about 8 months.    The length of time of the face-to-face encounter was 15  minutes. More than 50% of time was spent counseling and coordination of care.

## 2012-12-22 ENCOUNTER — Other Ambulatory Visit (HOSPITAL_BASED_OUTPATIENT_CLINIC_OR_DEPARTMENT_OTHER): Payer: Medicare Other

## 2012-12-22 DIAGNOSIS — C9110 Chronic lymphocytic leukemia of B-cell type not having achieved remission: Secondary | ICD-10-CM

## 2012-12-22 LAB — CBC WITH DIFFERENTIAL/PLATELET
BASO%: 0.1 % (ref 0.0–2.0)
EOS%: 0.4 % (ref 0.0–7.0)
HGB: 12.7 g/dL — ABNORMAL LOW (ref 13.0–17.1)
MCH: 32.6 pg (ref 27.2–33.4)
MCHC: 33.1 g/dL (ref 32.0–36.0)
RBC: 3.88 10*6/uL — ABNORMAL LOW (ref 4.20–5.82)
RDW: 13.3 % (ref 11.0–14.6)
lymph#: 26.3 10*3/uL — ABNORMAL HIGH (ref 0.9–3.3)

## 2012-12-22 LAB — TECHNOLOGIST REVIEW

## 2012-12-24 ENCOUNTER — Telehealth: Payer: Self-pay | Admitting: *Deleted

## 2012-12-24 NOTE — Telephone Encounter (Signed)
Message copied by Reesa Chew on Wed Dec 24, 2012  2:36 PM ------      Message from: Su Hilt C      Created: Wed Dec 24, 2012  1:09 PM                   ----- Message -----         From: Marcell Barlow, RN         Sent: 12/23/2012   4:47 PM           To: Marlowe Aschoff, RN                        ----- Message -----         From: Myrtis Ser, NP         Sent: 12/22/2012  11:36 AM           To: Marcell Barlow, RN            Please call pt. WBC is up slightly. Anemia is better and Plt are normal. Recommend continued observation. ------

## 2012-12-24 NOTE — Telephone Encounter (Signed)
Message copied by Reesa Chew on Wed Dec 24, 2012  3:16 PM ------      Message from: Su Hilt C      Created: Wed Dec 24, 2012  1:09 PM                   ----- Message -----         From: Marcell Barlow, RN         Sent: 12/23/2012   4:47 PM           To: Marlowe Aschoff, RN                        ----- Message -----         From: Myrtis Ser, NP         Sent: 12/22/2012  11:36 AM           To: Marcell Barlow, RN            Please call pt. WBC is up slightly. Anemia is better and Plt are normal. Recommend continued observation. ------

## 2012-12-24 NOTE — Telephone Encounter (Signed)
Spoke with wife, gave lab results and to continue observation.

## 2013-03-19 ENCOUNTER — Telehealth: Payer: Self-pay | Admitting: Hematology and Oncology

## 2013-03-19 NOTE — Telephone Encounter (Signed)
moved 12/8 lb/fu to AM due to call day. s/w wife re new appt time for 10:30am.

## 2013-03-20 ENCOUNTER — Other Ambulatory Visit: Payer: Self-pay | Admitting: Hematology and Oncology

## 2013-03-20 DIAGNOSIS — C9110 Chronic lymphocytic leukemia of B-cell type not having achieved remission: Secondary | ICD-10-CM

## 2013-03-23 ENCOUNTER — Encounter: Payer: Self-pay | Admitting: Hematology and Oncology

## 2013-03-23 ENCOUNTER — Telehealth: Payer: Self-pay | Admitting: Hematology and Oncology

## 2013-03-23 ENCOUNTER — Ambulatory Visit (HOSPITAL_BASED_OUTPATIENT_CLINIC_OR_DEPARTMENT_OTHER): Payer: Medicare Other | Admitting: Hematology and Oncology

## 2013-03-23 ENCOUNTER — Other Ambulatory Visit (HOSPITAL_BASED_OUTPATIENT_CLINIC_OR_DEPARTMENT_OTHER): Payer: Medicare Other | Admitting: Lab

## 2013-03-23 VITALS — BP 153/65 | HR 57 | Temp 97.3°F | Resp 18 | Ht 70.0 in | Wt 178.0 lb

## 2013-03-23 DIAGNOSIS — D696 Thrombocytopenia, unspecified: Secondary | ICD-10-CM

## 2013-03-23 DIAGNOSIS — D72820 Lymphocytosis (symptomatic): Secondary | ICD-10-CM

## 2013-03-23 DIAGNOSIS — D649 Anemia, unspecified: Secondary | ICD-10-CM

## 2013-03-23 DIAGNOSIS — C9110 Chronic lymphocytic leukemia of B-cell type not having achieved remission: Secondary | ICD-10-CM

## 2013-03-23 DIAGNOSIS — I10 Essential (primary) hypertension: Secondary | ICD-10-CM

## 2013-03-23 LAB — COMPREHENSIVE METABOLIC PANEL (CC13)
AST: 18 U/L (ref 5–34)
Albumin: 3.6 g/dL (ref 3.5–5.0)
Anion Gap: 7 mEq/L (ref 3–11)
CO2: 27 mEq/L (ref 22–29)
Chloride: 107 mEq/L (ref 98–109)
Glucose: 82 mg/dl (ref 70–140)
Potassium: 5 mEq/L (ref 3.5–5.1)
Sodium: 141 mEq/L (ref 136–145)
Total Bilirubin: 0.44 mg/dL (ref 0.20–1.20)
Total Protein: 6.1 g/dL — ABNORMAL LOW (ref 6.4–8.3)

## 2013-03-23 LAB — CBC WITH DIFFERENTIAL/PLATELET
BASO%: 0.1 % (ref 0.0–2.0)
Basophils Absolute: 0 10*3/uL (ref 0.0–0.1)
MCHC: 32 g/dL (ref 32.0–36.0)
MCV: 101.3 fL — ABNORMAL HIGH (ref 79.3–98.0)
MONO#: 1.2 10*3/uL — ABNORMAL HIGH (ref 0.1–0.9)
RBC: 3.83 10*6/uL — ABNORMAL LOW (ref 4.20–5.82)
RDW: 12.9 % (ref 11.0–14.6)
WBC: 28.6 10*3/uL — ABNORMAL HIGH (ref 4.0–10.3)
lymph#: 25.3 10*3/uL — ABNORMAL HIGH (ref 0.9–3.3)
nRBC: 0 % (ref 0–0)

## 2013-03-23 LAB — TECHNOLOGIST REVIEW

## 2013-03-23 NOTE — Telephone Encounter (Signed)
appts made per 12/8 POF June 2015 Cal mailed to pt [facility]

## 2013-03-23 NOTE — Progress Notes (Signed)
[facility] OFFICE PROGRESS NOTE  Patient Care Team: Konrad Felix, MD as PCP - General Artis Delay, MD as Consulting Physician (Hematology and Oncology)  DIAGNOSIS: CLL  SUMMARY OF ONCOLOGIC HISTORY: This is a pleasant 79 year old gentleman who is here accompanied by his wife. He was found to have CLL on routine blood work when he was found to have significant lymphocytosis. In June of 2011, flow cytometry confirmed diagnosis of CLL.  INTERVAL HISTORY: Gregory Rodgers 79 y.o. male returns for further followup. He's doing well.  He denies any recent fever, chills, night sweats or abnormal weight loss Denies any recent infection. The patient denies any recent signs or symptoms of bleeding such as spontaneous epistaxis, hematuria or hematochezia.  I have reviewed the past medical history, past surgical history, social history and family history with the patient and they are unchanged from previous note.  ALLERGIES:  has No Known Allergies.  MEDICATIONS:  Current Outpatient Prescriptions  Medication Sig Dispense Refill  . aspirin 81 MG tablet Take 81 mg by mouth daily.      Marland Kitchen co-enzyme Q-10 30 MG capsule Take 30 mg by mouth daily.      . fish oil-omega-3 fatty acids 1000 MG capsule Take 1 g by mouth daily.       Marland Kitchen lisinopril (PRINIVIL,ZESTRIL) 5 MG tablet Take 5 mg by mouth daily.      . Multiple Vitamin (MULTIVITAMIN) tablet Take 1 tablet by mouth daily.       No current facility-administered medications for this visit.    REVIEW OF SYSTEMS:   Constitutional: Denies fevers, chills or abnormal weight loss Eyes: Denies blurriness of vision Ears, nose, mouth, throat, and face: Denies mucositis or sore throat Respiratory: Denies cough, dyspnea or wheezes Cardiovascular: Denies palpitation, chest discomfort or lower extremity swelling Gastrointestinal:  Denies nausea, heartburn or change in bowel habits Skin: Denies abnormal skin rashes Lymphatics: Denies new  lymphadenopathy or easy bruising Neurological:Denies numbness, tingling or new weaknesses Behavioral/Psych: Mood is stable, no new changes  All other systems were reviewed with the patient and are negative.  PHYSICAL EXAMINATION: ECOG PERFORMANCE STATUS: 0 - Asymptomatic  Filed Vitals:   03/23/13 1049  BP: 153/65  Pulse: 57  Temp: 97.3 F (36.3 C)  Resp: 18   Filed Weights   03/23/13 1049  Weight: 178 lb (80.74 kg)    GENERAL:alert, no distress and comfortable SKIN: skin color, texture, turgor are normal, no rashes or significant lesions. Noticed significant solar keratosis EYES: normal, Conjunctiva are pink and non-injected, sclera clear OROPHARYNX:no exudate, no erythema and lips, buccal mucosa, and tongue normal  NECK: supple, thyroid normal size, non-tender, without nodularity LYMPH:  no palpable lymphadenopathy in the cervical, axillary or inguinal LUNGS: clear to auscultation and percussion with normal breathing effort HEART: regular rate & rhythm and no murmurs and no lower extremity edema ABDOMEN:abdomen soft, non-tender and normal bowel sounds Musculoskeletal:no cyanosis of digits and no clubbing  NEURO: alert & oriented x 3 with fluent speech, no focal motor/sensory deficits  LABORATORY DATA:  I have reviewed the data as listed    Component Value Date/Time   NA 141 03/23/2013 1034   NA 141 06/27/2011 1258   K 5.0 03/23/2013 1034   K 4.0 06/27/2011 1258   CL 108* 08/25/2012 1040   CL 105 06/27/2011 1258   CO2 27 03/23/2013 1034   CO2 27 06/27/2011 1258   GLUCOSE 82 03/23/2013 1034   GLUCOSE 91 08/25/2012 1040   GLUCOSE 96  06/27/2011 1258   BUN 19.4 03/23/2013 1034   BUN 22 06/27/2011 1258   CREATININE 0.9 03/23/2013 1034   CREATININE 1.00 06/27/2011 1258   CALCIUM 8.9 03/23/2013 1034   CALCIUM 8.8 06/27/2011 1258   PROT 6.1* 03/23/2013 1034   PROT 5.9* 06/27/2011 1258   ALBUMIN 3.6 03/23/2013 1034   ALBUMIN 4.0 06/27/2011 1258   AST 18 03/23/2013 1034   AST 22 06/27/2011  1258   ALT 14 03/23/2013 1034   ALT 15 06/27/2011 1258   ALKPHOS 81 03/23/2013 1034   ALKPHOS 68 06/27/2011 1258   BILITOT 0.44 03/23/2013 1034   BILITOT 0.6 06/27/2011 1258    No results found for this basename: SPEP, UPEP,  kappa and lambda light chains    Lab Results  Component Value Date   WBC 28.6* 03/23/2013   NEUTROABS 2.0 03/23/2013   HGB 12.4* 03/23/2013   HCT 38.8 03/23/2013   MCV 101.3* 03/23/2013   PLT 137* 03/23/2013      Chemistry      Component Value Date/Time   NA 141 03/23/2013 1034   NA 141 06/27/2011 1258   K 5.0 03/23/2013 1034   K 4.0 06/27/2011 1258   CL 108* 08/25/2012 1040   CL 105 06/27/2011 1258   CO2 27 03/23/2013 1034   CO2 27 06/27/2011 1258   BUN 19.4 03/23/2013 1034   BUN 22 06/27/2011 1258   CREATININE 0.9 03/23/2013 1034   CREATININE 1.00 06/27/2011 1258      Component Value Date/Time   CALCIUM 8.9 03/23/2013 1034   CALCIUM 8.8 06/27/2011 1258   ALKPHOS 81 03/23/2013 1034   ALKPHOS 68 06/27/2011 1258   AST 18 03/23/2013 1034   AST 22 06/27/2011 1258   ALT 14 03/23/2013 1034   ALT 15 06/27/2011 1258   BILITOT 0.44 03/23/2013 1034   BILITOT 0.6 06/27/2011 1258      ASSESSMENT & PLAN:  #1 CLL #2 anemia #3 thrombocytopenia The patient has significant lymphocytosis with associated anemia and thrombocytopenia. By RAI stage criteria, he would have stage IV disease Overall, the patient is asymptomatic. He does not need any form of blood or platelet transfusion due to the minimum change in his blood count. His blood count has been fairly stable. The lymphocyte doubling time has not been reached. I recommend history, physical examination and blood work every 6 months. I stress the importance of preventive care including yearly influenza vaccination. The patient has received his vaccination program recently. Educated the patient and his wife signs and symptoms to watch out for disease progression such as unexplained weight loss, new onset of profound fatigue, new  lymphadenopathy and if he has any of those symptoms he will call me and we'll see him back sooner #4 preventive care I recommend he avoid excessive sun exposure. Patient would be at risk of skin cancer. The patient is seeing his dermatologist on a routine basis #5 hypertension The patient is taking blood pressure medications regularly. I would defer to his primary care provider to follow on that and adjust medications as needed.  Orders Placed This Encounter  Procedures  . CBC with Differential    Standing Status: Future     Number of Occurrences:      Standing Expiration Date: 12/13/2013  . Comprehensive metabolic panel    Standing Status: Future     Number of Occurrences:      Standing Expiration Date: 03/23/2014  . IgG, IgA, IgM    Standing Status: Future  Number of Occurrences:      Standing Expiration Date: 03/23/2014   All questions were answered. The patient knows to call the clinic with any problems, questions or concerns. No barriers to learning was detected. I spent 25 minutes counseling the patient face to face. The total time spent in the appointment was 40 minutes and more than 50% was on counseling and review of test results     [facility], Josph Norfleet, MD 03/23/2013 12:44 PM

## 2013-03-24 LAB — IGG, IGA, IGM
IgA: 111 mg/dL (ref 68–379)
IgG (Immunoglobin G), Serum: 676 mg/dL (ref 650–1600)

## 2013-09-21 ENCOUNTER — Telehealth: Payer: Self-pay | Admitting: Hematology and Oncology

## 2013-09-21 ENCOUNTER — Other Ambulatory Visit: Payer: Self-pay | Admitting: Hematology and Oncology

## 2013-09-21 ENCOUNTER — Ambulatory Visit (HOSPITAL_BASED_OUTPATIENT_CLINIC_OR_DEPARTMENT_OTHER): Payer: Medicare Other | Admitting: Hematology and Oncology

## 2013-09-21 ENCOUNTER — Encounter: Payer: Self-pay | Admitting: Hematology and Oncology

## 2013-09-21 ENCOUNTER — Other Ambulatory Visit (HOSPITAL_BASED_OUTPATIENT_CLINIC_OR_DEPARTMENT_OTHER): Payer: Medicare Other

## 2013-09-21 VITALS — BP 148/59 | HR 52 | Temp 97.6°F | Resp 18 | Wt 176.6 lb

## 2013-09-21 DIAGNOSIS — C9110 Chronic lymphocytic leukemia of B-cell type not having achieved remission: Secondary | ICD-10-CM

## 2013-09-21 DIAGNOSIS — D630 Anemia in neoplastic disease: Secondary | ICD-10-CM

## 2013-09-21 LAB — CBC WITH DIFFERENTIAL/PLATELET
BASO%: 0.1 % (ref 0.0–2.0)
BASOS ABS: 0 10*3/uL (ref 0.0–0.1)
EOS%: 0.6 % (ref 0.0–7.0)
Eosinophils Absolute: 0.2 10*3/uL (ref 0.0–0.5)
HEMATOCRIT: 38.2 % — AB (ref 38.4–49.9)
HEMOGLOBIN: 12.2 g/dL — AB (ref 13.0–17.1)
LYMPH#: 26.5 10*3/uL — AB (ref 0.9–3.3)
LYMPH%: 87 % — ABNORMAL HIGH (ref 14.0–49.0)
MCH: 32.1 pg (ref 27.2–33.4)
MCHC: 31.9 g/dL — ABNORMAL LOW (ref 32.0–36.0)
MCV: 100.4 fL — ABNORMAL HIGH (ref 79.3–98.0)
MONO#: 0.7 10*3/uL (ref 0.1–0.9)
MONO%: 2.2 % (ref 0.0–14.0)
NEUT#: 3.1 10*3/uL (ref 1.5–6.5)
NEUT%: 10.1 % — AB (ref 39.0–75.0)
Platelets: 145 10*3/uL (ref 140–400)
RBC: 3.81 10*6/uL — ABNORMAL LOW (ref 4.20–5.82)
RDW: 13.1 % (ref 11.0–14.6)
WBC: 30.4 10*3/uL — AB (ref 4.0–10.3)

## 2013-09-21 LAB — COMPREHENSIVE METABOLIC PANEL (CC13)
ALT: 13 U/L (ref 0–55)
AST: 17 U/L (ref 5–34)
Albumin: 3.6 g/dL (ref 3.5–5.0)
Alkaline Phosphatase: 69 U/L (ref 40–150)
Anion Gap: 6 mEq/L (ref 3–11)
BUN: 21.8 mg/dL (ref 7.0–26.0)
CALCIUM: 8.7 mg/dL (ref 8.4–10.4)
CHLORIDE: 108 meq/L (ref 98–109)
CO2: 27 meq/L (ref 22–29)
Creatinine: 1 mg/dL (ref 0.7–1.3)
Glucose: 95 mg/dl (ref 70–140)
Potassium: 4.4 mEq/L (ref 3.5–5.1)
Sodium: 141 mEq/L (ref 136–145)
Total Bilirubin: 0.55 mg/dL (ref 0.20–1.20)
Total Protein: 6 g/dL — ABNORMAL LOW (ref 6.4–8.3)

## 2013-09-21 NOTE — Telephone Encounter (Signed)
Called pt to give appts ordered (lab/MD) in 1year. S/w pt's wife, gave appt for 6/6 @ 9.30am and mailed appt calendar.

## 2013-09-21 NOTE — Assessment & Plan Note (Signed)
The progress is slow. The patient remain on stage 0. I recommend a yearly visit from now on with history, physical examination and blood work. I reinforced the importance of vaccination program.

## 2013-09-21 NOTE — Progress Notes (Signed)
[facility] OFFICE PROGRESS NOTE  Patient Care Team: Charletta Cousin, MD as PCP - General Heath Lark, MD as Consulting Physician (Hematology and Oncology)  SUMMARY OF ONCOLOGIC HISTORY:   CLL (chronic lymphocytic leukemia)   09/26/2009 Pathology Flow cytometry confirmed diagnosis of CLL. LEUKEMIC CELLS ARE NEGATIVE FOR CD38 AND ZAP70 EXPRESSION    INTERVAL HISTORY: Please see below for problem oriented charting. He denies any recent fever, chills, night sweats or abnormal weight loss He denies any Gregory lymphadenopathy.  REVIEW OF SYSTEMS:   Constitutional: Denies fevers, chills or abnormal weight loss Eyes: Denies blurriness of vision Ears, nose, mouth, throat, and face: Denies mucositis or sore throat Respiratory: Denies cough, dyspnea or wheezes Cardiovascular: Denies palpitation, chest discomfort or lower extremity swelling Gastrointestinal:  Denies nausea, heartburn or change in bowel habits Skin: Denies abnormal skin rashes Lymphatics: Denies Gregory lymphadenopathy or easy bruising Neurological:Denies numbness, tingling or Gregory weaknesses Behavioral/Psych: Mood is stable, no Gregory changes  All other systems were reviewed with the patient and are negative.  I have reviewed the past medical history, past surgical history, social history and family history with the patient and they are unchanged from previous note.  ALLERGIES:  has No Known Allergies.  MEDICATIONS:  Current Outpatient Prescriptions  Medication Sig Dispense Refill  . aspirin 81 MG tablet Take 81 mg by mouth daily.      Marland Kitchen co-enzyme Q-10 30 MG capsule Take 30 mg by mouth daily.      . fish oil-omega-3 fatty acids 1000 MG capsule Take 1 g by mouth daily.       Marland Kitchen lisinopril (PRINIVIL,ZESTRIL) 5 MG tablet Take 5 mg by mouth daily.      . Multiple Vitamin (MULTIVITAMIN) tablet Take 1 tablet by mouth daily.       No current facility-administered medications for this visit.    PHYSICAL EXAMINATION: ECOG  PERFORMANCE STATUS: 0 - Asymptomatic  Filed Vitals:   09/21/13 1254  BP: 148/59  Pulse: 52  Temp: 97.6 F (36.4 C)  Resp: 18   Filed Weights   09/21/13 1254  Weight: 176 lb 9 oz (80.088 kg)    GENERAL:alert, no distress and comfortable SKIN: skin color, texture, turgor are normal, no rashes or significant lesions. Multiple solar keratosis but no suspicious moles. EYES: normal, Conjunctiva are pink and non-injected, sclera clear OROPHARYNX:no exudate, no erythema and lips, buccal mucosa, and tongue normal  NECK: supple, thyroid normal size, non-tender, without nodularity LYMPH:  no palpable lymphadenopathy in the cervical, axillary or inguinal LUNGS: clear to auscultation and percussion with normal breathing effort HEART: regular rate & rhythm and no murmurs and no lower extremity edema ABDOMEN:abdomen soft, non-tender and normal bowel sounds. No splenomegaly Musculoskeletal:no cyanosis of digits and no clubbing  NEURO: alert & oriented x 3 with fluent speech, no focal motor/sensory deficits  LABORATORY DATA:  I have reviewed the data as listed    Component Value Date/Time   NA 141 09/21/2013 1222   NA 141 06/27/2011 1258   K 4.4 09/21/2013 1222   K 4.0 06/27/2011 1258   CL 108* 08/25/2012 1040   CL 105 06/27/2011 1258   CO2 27 09/21/2013 1222   CO2 27 06/27/2011 1258   GLUCOSE 95 09/21/2013 1222   GLUCOSE 91 08/25/2012 1040   GLUCOSE 96 06/27/2011 1258   BUN 21.8 09/21/2013 1222   BUN 22 06/27/2011 1258   CREATININE 1.0 09/21/2013 1222   CREATININE 1.00 06/27/2011 1258   CALCIUM 8.7 09/21/2013 1222  CALCIUM 8.8 06/27/2011 1258   PROT 6.0* 09/21/2013 1222   PROT 5.9* 06/27/2011 1258   ALBUMIN 3.6 09/21/2013 1222   ALBUMIN 4.0 06/27/2011 1258   AST 17 09/21/2013 1222   AST 22 06/27/2011 1258   ALT 13 09/21/2013 1222   ALT 15 06/27/2011 1258   ALKPHOS 69 09/21/2013 1222   ALKPHOS 68 06/27/2011 1258   BILITOT 0.55 09/21/2013 1222   BILITOT 0.6 06/27/2011 1258    No results found for this basename:  SPEP, UPEP,  kappa and lambda light chains    Lab Results  Component Value Date   WBC 30.4* 09/21/2013   NEUTROABS 3.1 09/21/2013   HGB 12.2* 09/21/2013   HCT 38.2* 09/21/2013   MCV 100.4* 09/21/2013   PLT 145 09/21/2013      Chemistry      Component Value Date/Time   NA 141 09/21/2013 1222   NA 141 06/27/2011 1258   K 4.4 09/21/2013 1222   K 4.0 06/27/2011 1258   CL 108* 08/25/2012 1040   CL 105 06/27/2011 1258   CO2 27 09/21/2013 1222   CO2 27 06/27/2011 1258   BUN 21.8 09/21/2013 1222   BUN 22 06/27/2011 1258   CREATININE 1.0 09/21/2013 1222   CREATININE 1.00 06/27/2011 1258      Component Value Date/Time   CALCIUM 8.7 09/21/2013 1222   CALCIUM 8.8 06/27/2011 1258   ALKPHOS 69 09/21/2013 1222   ALKPHOS 68 06/27/2011 1258   AST 17 09/21/2013 1222   AST 22 06/27/2011 1258   ALT 13 09/21/2013 1222   ALT 15 06/27/2011 1258   BILITOT 0.55 09/21/2013 1222   BILITOT 0.6 06/27/2011 1258     ASSESSMENT & PLAN:  Anemia in neoplastic disease This is likely anemia of chronic disease. The patient denies recent history of bleeding such as epistaxis, hematuria or hematochezia. He is asymptomatic from the anemia. We will observe for now.  He does not require transfusion now.    CLL (chronic lymphocytic leukemia) The progress is slow. The patient remain on stage 0. I recommend a yearly visit from now on with history, physical examination and blood work. I reinforced the importance of vaccination program.   Orders Placed This Encounter  Procedures  . CBC with Differential    Standing Status: Future     Number of Occurrences:      Standing Expiration Date: 09/21/2014  . Comprehensive metabolic panel    Standing Status: Future     Number of Occurrences:      Standing Expiration Date: 09/21/2014  . Lactate dehydrogenase    Standing Status: Future     Number of Occurrences:      Standing Expiration Date: 09/21/2014   All questions were answered. The patient knows to call the clinic with any problems, questions or  concerns. No barriers to learning was detected. I spent 15 minutes counseling the patient face to face. The total time spent in the appointment was 20 minutes and more than 50% was on counseling and review of test results     Heath Lark, MD 09/21/2013 1:42 PM

## 2013-09-21 NOTE — Assessment & Plan Note (Signed)
This is likely anemia of chronic disease. The patient denies recent history of bleeding such as epistaxis, hematuria or hematochezia. He is asymptomatic from the anemia. We will observe for now.  He does not require transfusion now.   

## 2013-09-22 LAB — IGG, IGA, IGM
IGG (IMMUNOGLOBIN G), SERUM: 620 mg/dL — AB (ref 650–1600)
IgA: 107 mg/dL (ref 68–379)
IgM, Serum: 64 mg/dL (ref 41–251)

## 2014-09-09 ENCOUNTER — Telehealth: Payer: Self-pay | Admitting: Hematology and Oncology

## 2014-09-09 NOTE — Telephone Encounter (Signed)
pt dauther called to r/s appt....i gave her the only available in [place]....she will call me back to confirm which appt they will keep.

## 2014-09-09 NOTE — Telephone Encounter (Signed)
pt daughter called to cx 6.6 appt and keep the 6.17

## 2014-09-20 ENCOUNTER — Other Ambulatory Visit: Payer: Medicare Other

## 2014-09-20 ENCOUNTER — Ambulatory Visit: Payer: Medicare Other | Admitting: Hematology and Oncology

## 2014-09-30 ENCOUNTER — Other Ambulatory Visit: Payer: Self-pay | Admitting: Hematology and Oncology

## 2014-09-30 DIAGNOSIS — C9110 Chronic lymphocytic leukemia of B-cell type not having achieved remission: Secondary | ICD-10-CM

## 2014-10-01 ENCOUNTER — Encounter: Payer: Self-pay | Admitting: Hematology and Oncology

## 2014-10-01 ENCOUNTER — Ambulatory Visit (HOSPITAL_BASED_OUTPATIENT_CLINIC_OR_DEPARTMENT_OTHER): Payer: Medicare Other | Admitting: Hematology and Oncology

## 2014-10-01 ENCOUNTER — Telehealth: Payer: Self-pay | Admitting: Hematology and Oncology

## 2014-10-01 ENCOUNTER — Other Ambulatory Visit (HOSPITAL_BASED_OUTPATIENT_CLINIC_OR_DEPARTMENT_OTHER): Payer: Medicare Other

## 2014-10-01 VITALS — BP 160/65 | HR 51 | Temp 97.5°F | Resp 17 | Ht 70.0 in | Wt 172.4 lb

## 2014-10-01 DIAGNOSIS — C9110 Chronic lymphocytic leukemia of B-cell type not having achieved remission: Secondary | ICD-10-CM

## 2014-10-01 DIAGNOSIS — D630 Anemia in neoplastic disease: Secondary | ICD-10-CM

## 2014-10-01 DIAGNOSIS — D696 Thrombocytopenia, unspecified: Secondary | ICD-10-CM

## 2014-10-01 DIAGNOSIS — I10 Essential (primary) hypertension: Secondary | ICD-10-CM

## 2014-10-01 LAB — COMPREHENSIVE METABOLIC PANEL (CC13)
ALT: 18 U/L (ref 0–55)
ANION GAP: 4 meq/L (ref 3–11)
AST: 18 U/L (ref 5–34)
Albumin: 3.5 g/dL (ref 3.5–5.0)
Alkaline Phosphatase: 76 U/L (ref 40–150)
BUN: 22.9 mg/dL (ref 7.0–26.0)
CALCIUM: 8.6 mg/dL (ref 8.4–10.4)
CHLORIDE: 109 meq/L (ref 98–109)
CO2: 28 meq/L (ref 22–29)
Creatinine: 1 mg/dL (ref 0.7–1.3)
EGFR: 71 mL/min/{1.73_m2} — AB (ref >90)
Glucose: 96 mg/dl (ref 70–140)
Potassium: 4.6 mEq/L (ref 3.5–5.1)
SODIUM: 142 meq/L (ref 136–145)
TOTAL PROTEIN: 5.7 g/dL — AB (ref 6.4–8.3)
Total Bilirubin: 0.53 mg/dL (ref 0.20–1.20)

## 2014-10-01 LAB — CBC WITH DIFFERENTIAL/PLATELET
BASO%: 0.2 % (ref 0.0–2.0)
BASOS ABS: 0 10*3/uL (ref 0.0–0.1)
EOS%: 0.8 % (ref 0.0–7.0)
Eosinophils Absolute: 0.2 10*3/uL (ref 0.0–0.5)
HEMATOCRIT: 35.9 % — AB (ref 38.4–49.9)
HEMOGLOBIN: 11.8 g/dL — AB (ref 13.0–17.1)
LYMPH#: 23.6 10*3/uL — AB (ref 0.9–3.3)
LYMPH%: 89.7 % — ABNORMAL HIGH (ref 14.0–49.0)
MCH: 33.4 pg (ref 27.2–33.4)
MCHC: 32.9 g/dL (ref 32.0–36.0)
MCV: 101.7 fL — ABNORMAL HIGH (ref 79.3–98.0)
MONO#: 0.4 10*3/uL (ref 0.1–0.9)
MONO%: 1.7 % (ref 0.0–14.0)
NEUT#: 2 10*3/uL (ref 1.5–6.5)
NEUT%: 7.6 % — AB (ref 39.0–75.0)
Platelets: 114 10*3/uL — ABNORMAL LOW (ref 140–400)
RBC: 3.53 10*6/uL — ABNORMAL LOW (ref 4.20–5.82)
RDW: 13.3 % (ref 11.0–14.6)
WBC: 26.3 10*3/uL — AB (ref 4.0–10.3)
nRBC: 0 % (ref 0–0)

## 2014-10-01 LAB — LACTATE DEHYDROGENASE (CC13): LDH: 155 U/L (ref 125–245)

## 2014-10-01 LAB — TECHNOLOGIST REVIEW

## 2014-10-01 NOTE — Assessment & Plan Note (Signed)
The patient remained asymptomatic.  I recommend a yearly visit from now on with history, physical examination and blood work. I reinforced the importance of vaccination program and close follow-up with dermatologist due to prior history of skin cancer.

## 2014-10-01 NOTE — Assessment & Plan Note (Signed)
This is likely anemia of chronic disease. The patient denies recent history of bleeding such as epistaxis, hematuria or hematochezia. He is asymptomatic from the anemia. We will observe for now.  

## 2014-10-01 NOTE — Assessment & Plan Note (Signed)
The cause is likely related to CLL. It is mild and there is little change compared from previous platelet count. The patient denies recent history of bleeding such as epistaxis, hematuria or hematochezia. He is asymptomatic from the thrombocytopenia. I will observe for now.  There is no contraindication to remain on aspirin as well as blood count is greater than 50,000.

## 2014-10-01 NOTE — Assessment & Plan Note (Signed)
His blood pressure is a little high likely related to white coat hypertension. He will continue lisinopril and close blood pressure monitoring and medication adjustment through primary care doctor

## 2014-10-01 NOTE — Telephone Encounter (Signed)
Pt confirmed labs/ov per 06/17 POF, gave pt AVS and Calendar..... KJ

## 2014-10-01 NOTE — Progress Notes (Signed)
[place] OFFICE PROGRESS NOTE  Patient Care Team: Charletta Cousin, MD as PCP - General Heath Lark, MD as Consulting Physician (Hematology and Oncology)  SUMMARY OF ONCOLOGIC HISTORY:   CLL (chronic lymphocytic leukemia)   09/26/2009 Pathology Results Flow cytometry confirmed diagnosis of CLL. LEUKEMIC CELLS ARE NEGATIVE FOR CD38 AND ZAP70 EXPRESSION    INTERVAL HISTORY: Please see below for problem oriented charting. He feels well. He had one episode of upper respiratory tract infection a month ago but that has resolved. He bruises easily. The patient denies any recent signs or symptoms of bleeding such as spontaneous epistaxis, hematuria or hematochezia. Denies new lymphadenopathy.  REVIEW OF SYSTEMS:   Constitutional: Denies fevers, chills or abnormal weight loss Eyes: Denies blurriness of vision Ears, nose, mouth, throat, and face: Denies mucositis or sore throat Respiratory: Denies cough, dyspnea or wheezes Cardiovascular: Denies palpitation, chest discomfort or lower extremity swelling Gastrointestinal:  Denies nausea, heartburn or change in bowel habits Skin: Denies abnormal skin rashes Lymphatics: Denies new lymphadenopathy  Neurological:Denies numbness, tingling or new weaknesses Behavioral/Psych: Mood is stable, no new changes  All other systems were reviewed with the patient and are negative.  I have reviewed the past medical history, past surgical history, social history and family history with the patient and they are unchanged from previous note.  ALLERGIES:  has No Known Allergies.  MEDICATIONS:  Current Outpatient Prescriptions  Medication Sig Dispense Refill  . aspirin 81 MG tablet Take 81 mg by mouth daily.    . Cholecalciferol (VITAMIN D) 2000 UNITS tablet Take 2,000 Units by mouth daily.    Marland Kitchen co-enzyme Q-10 30 MG capsule Take 30 mg by mouth daily.    . fish oil-omega-3 fatty acids 1000 MG capsule Take 1 g by mouth daily.     Marland Kitchen lisinopril  (PRINIVIL,ZESTRIL) 5 MG tablet Take 5 mg by mouth daily.    . Multiple Vitamin (MULTIVITAMIN) tablet Take 1 tablet by mouth daily.     No current facility-administered medications for this visit.    PHYSICAL EXAMINATION: ECOG PERFORMANCE STATUS: 0 - Asymptomatic  Filed Vitals:   10/01/14 0822  BP: 160/65  Pulse: 51  Temp: 97.5 F (36.4 C)  Resp: 17   Filed Weights   10/01/14 0822  Weight: 172 lb 6.4 oz (78.2 kg)    GENERAL:alert, no distress and comfortable SKIN: skin color, texture, turgor are normal, no rashes or significant lesions. Extensive bruises are noted. Noted a few keratosis on his face and scalp EYES: normal, Conjunctiva are pink and non-injected, sclera clear OROPHARYNX:no exudate, no erythema and lips, buccal mucosa, and tongue normal  NECK: supple, thyroid normal size, non-tender, without nodularity LYMPH:  no palpable lymphadenopathy in the cervical, axillary or inguinal LUNGS: clear to auscultation and percussion with normal breathing effort HEART: regular rate & rhythm and no murmurs with left lower extremity edema ABDOMEN:abdomen soft, non-tender and normal bowel sounds Musculoskeletal:no cyanosis of digits and no clubbing  NEURO: alert & oriented x 3 with fluent speech, no focal motor/sensory deficits  LABORATORY DATA:  I have reviewed the data as listed    Component Value Date/Time   NA 141 09/21/2013 1222   NA 141 06/27/2011 1258   K 4.4 09/21/2013 1222   K 4.0 06/27/2011 1258   CL 108* 08/25/2012 1040   CL 105 06/27/2011 1258   CO2 27 09/21/2013 1222   CO2 27 06/27/2011 1258   GLUCOSE 95 09/21/2013 1222   GLUCOSE 91 08/25/2012 1040  GLUCOSE 96 06/27/2011 1258   BUN 21.8 09/21/2013 1222   BUN 22 06/27/2011 1258   CREATININE 1.0 09/21/2013 1222   CREATININE 1.00 06/27/2011 1258   CALCIUM 8.7 09/21/2013 1222   CALCIUM 8.8 06/27/2011 1258   PROT 6.0* 09/21/2013 1222   PROT 5.9* 06/27/2011 1258   ALBUMIN 3.6 09/21/2013 1222   ALBUMIN 4.0  06/27/2011 1258   AST 17 09/21/2013 1222   AST 22 06/27/2011 1258   ALT 13 09/21/2013 1222   ALT 15 06/27/2011 1258   ALKPHOS 69 09/21/2013 1222   ALKPHOS 68 06/27/2011 1258   BILITOT 0.55 09/21/2013 1222   BILITOT 0.6 06/27/2011 1258    No results found for: SPEP, UPEP  Lab Results  Component Value Date   WBC 26.3* 10/01/2014   NEUTROABS 2.0 10/01/2014   HGB 11.8* 10/01/2014   HCT 35.9* 10/01/2014   MCV 101.7* 10/01/2014   PLT 114* 10/01/2014      Chemistry      Component Value Date/Time   NA 141 09/21/2013 1222   NA 141 06/27/2011 1258   K 4.4 09/21/2013 1222   K 4.0 06/27/2011 1258   CL 108* 08/25/2012 1040   CL 105 06/27/2011 1258   CO2 27 09/21/2013 1222   CO2 27 06/27/2011 1258   BUN 21.8 09/21/2013 1222   BUN 22 06/27/2011 1258   CREATININE 1.0 09/21/2013 1222   CREATININE 1.00 06/27/2011 1258      Component Value Date/Time   CALCIUM 8.7 09/21/2013 1222   CALCIUM 8.8 06/27/2011 1258   ALKPHOS 69 09/21/2013 1222   ALKPHOS 68 06/27/2011 1258   AST 17 09/21/2013 1222   AST 22 06/27/2011 1258   ALT 13 09/21/2013 1222   ALT 15 06/27/2011 1258   BILITOT 0.55 09/21/2013 1222   BILITOT 0.6 06/27/2011 1258       ASSESSMENT & PLAN:  CLL (chronic lymphocytic leukemia) The patient remained asymptomatic.  I recommend a yearly visit from now on with history, physical examination and blood work. I reinforced the importance of vaccination program and close follow-up with dermatologist due to prior history of skin cancer.    Anemia in neoplastic disease This is likely anemia of chronic disease. The patient denies recent history of bleeding such as epistaxis, hematuria or hematochezia. He is asymptomatic from the anemia. We will observe for now.  Essential hypertension His blood pressure is a little high likely related to white coat hypertension. He will continue lisinopril and close blood pressure monitoring and medication adjustment through primary care  doctor  Thrombocytopenia The cause is likely related to CLL. It is mild and there is little change compared from previous platelet count. The patient denies recent history of bleeding such as epistaxis, hematuria or hematochezia. He is asymptomatic from the thrombocytopenia. I will observe for now.  There is no contraindication to remain on aspirin as well as blood count is greater than 50,000.   Orders Placed This Encounter  Procedures  . CBC with Differential/Platelet    Standing Status: Future     Number of Occurrences:      Standing Expiration Date: 11/05/2015  . Comprehensive metabolic panel    Standing Status: Future     Number of Occurrences:      Standing Expiration Date: 11/05/2015  . Lactate dehydrogenase    Standing Status: Future     Number of Occurrences:      Standing Expiration Date: 11/05/2015   All questions were answered. The patient knows to call  the clinic with any problems, questions or concerns. No barriers to learning was detected. I spent 15 minutes counseling the patient face to face. The total time spent in the appointment was 20 minutes and more than 50% was on counseling and review of test results     [facility], Aaliyah Cancro, MD 10/01/2014 8:47 AM

## 2015-04-22 DIAGNOSIS — M7071 Other bursitis of hip, right hip: Secondary | ICD-10-CM | POA: Diagnosis not present

## 2015-04-22 DIAGNOSIS — M25551 Pain in right hip: Secondary | ICD-10-CM | POA: Diagnosis not present

## 2015-05-11 DIAGNOSIS — H353131 Nonexudative age-related macular degeneration, bilateral, early dry stage: Secondary | ICD-10-CM | POA: Diagnosis not present

## 2015-05-20 DIAGNOSIS — L570 Actinic keratosis: Secondary | ICD-10-CM | POA: Diagnosis not present

## 2015-06-13 DIAGNOSIS — D044 Carcinoma in situ of skin of scalp and neck: Secondary | ICD-10-CM | POA: Diagnosis not present

## 2015-09-23 DIAGNOSIS — E785 Hyperlipidemia, unspecified: Secondary | ICD-10-CM | POA: Diagnosis not present

## 2015-09-23 DIAGNOSIS — H6123 Impacted cerumen, bilateral: Secondary | ICD-10-CM | POA: Diagnosis not present

## 2015-09-23 DIAGNOSIS — R7303 Prediabetes: Secondary | ICD-10-CM | POA: Diagnosis not present

## 2015-09-23 DIAGNOSIS — Z1389 Encounter for screening for other disorder: Secondary | ICD-10-CM | POA: Diagnosis not present

## 2015-09-23 DIAGNOSIS — E559 Vitamin D deficiency, unspecified: Secondary | ICD-10-CM | POA: Diagnosis not present

## 2015-09-23 DIAGNOSIS — I10 Essential (primary) hypertension: Secondary | ICD-10-CM | POA: Diagnosis not present

## 2015-09-23 DIAGNOSIS — Z9181 History of falling: Secondary | ICD-10-CM | POA: Diagnosis not present

## 2015-09-30 ENCOUNTER — Telehealth: Payer: Self-pay | Admitting: Hematology and Oncology

## 2015-09-30 ENCOUNTER — Ambulatory Visit (HOSPITAL_BASED_OUTPATIENT_CLINIC_OR_DEPARTMENT_OTHER): Payer: PPO | Admitting: Hematology and Oncology

## 2015-09-30 ENCOUNTER — Other Ambulatory Visit (HOSPITAL_BASED_OUTPATIENT_CLINIC_OR_DEPARTMENT_OTHER): Payer: PPO

## 2015-09-30 ENCOUNTER — Encounter: Payer: Self-pay | Admitting: Hematology and Oncology

## 2015-09-30 VITALS — BP 149/50 | HR 53 | Temp 97.9°F | Resp 18 | Ht 70.0 in | Wt 173.0 lb

## 2015-09-30 DIAGNOSIS — C9110 Chronic lymphocytic leukemia of B-cell type not having achieved remission: Secondary | ICD-10-CM

## 2015-09-30 DIAGNOSIS — I10 Essential (primary) hypertension: Secondary | ICD-10-CM

## 2015-09-30 DIAGNOSIS — D61818 Other pancytopenia: Secondary | ICD-10-CM | POA: Insufficient documentation

## 2015-09-30 LAB — CBC WITH DIFFERENTIAL/PLATELET
BASO%: 0.1 % (ref 0.0–2.0)
Basophils Absolute: 0 10*3/uL (ref 0.0–0.1)
EOS%: 1.1 % (ref 0.0–7.0)
Eosinophils Absolute: 0.3 10*3/uL (ref 0.0–0.5)
HCT: 35 % — ABNORMAL LOW (ref 38.4–49.9)
HEMOGLOBIN: 11.3 g/dL — AB (ref 13.0–17.1)
LYMPH#: 22.1 10*3/uL — AB (ref 0.9–3.3)
LYMPH%: 87.3 % — ABNORMAL HIGH (ref 14.0–49.0)
MCH: 32.9 pg (ref 27.2–33.4)
MCHC: 32.3 g/dL (ref 32.0–36.0)
MCV: 102 fL — ABNORMAL HIGH (ref 79.3–98.0)
MONO#: 1 10*3/uL — AB (ref 0.1–0.9)
MONO%: 3.8 % (ref 0.0–14.0)
NEUT%: 7.7 % — ABNORMAL LOW (ref 39.0–75.0)
NEUTROS ABS: 2 10*3/uL (ref 1.5–6.5)
NRBC: 0 % (ref 0–0)
PLATELETS: 135 10*3/uL — AB (ref 140–400)
RBC: 3.43 10*6/uL — AB (ref 4.20–5.82)
RDW: 13.4 % (ref 11.0–14.6)
WBC: 25.3 10*3/uL — AB (ref 4.0–10.3)

## 2015-09-30 LAB — COMPREHENSIVE METABOLIC PANEL
ALT: 13 U/L (ref 0–55)
ANION GAP: 7 meq/L (ref 3–11)
AST: 16 U/L (ref 5–34)
Albumin: 3.5 g/dL (ref 3.5–5.0)
Alkaline Phosphatase: 65 U/L (ref 40–150)
BILIRUBIN TOTAL: 0.51 mg/dL (ref 0.20–1.20)
BUN: 23.3 mg/dL (ref 7.0–26.0)
CALCIUM: 8.7 mg/dL (ref 8.4–10.4)
CO2: 25 mEq/L (ref 22–29)
CREATININE: 1 mg/dL (ref 0.7–1.3)
Chloride: 109 mEq/L (ref 98–109)
EGFR: 69 mL/min/{1.73_m2} — ABNORMAL LOW (ref >90)
Glucose: 89 mg/dl (ref 70–140)
Potassium: 4.5 mEq/L (ref 3.5–5.1)
Sodium: 141 mEq/L (ref 136–145)
TOTAL PROTEIN: 5.8 g/dL — AB (ref 6.4–8.3)

## 2015-09-30 LAB — LACTATE DEHYDROGENASE: LDH: 149 U/L (ref 125–245)

## 2015-09-30 LAB — TECHNOLOGIST REVIEW

## 2015-09-30 NOTE — Assessment & Plan Note (Signed)
The patient remained asymptomatic.  I recommend a yearly visit from now on with history, physical examination and blood work.

## 2015-09-30 NOTE — Assessment & Plan Note (Signed)
The cause is likely related to CLL. It is mild and there is little change compared from previous hemoglobin and platelet count. The patient denies recent history of bleeding such as epistaxis, hematuria or hematochezia. He is asymptomatic from the thrombocytopenia and anemia. I will observe for now.  There is no contraindication to remain on aspirin as well as blood count is greater than 50,000.   

## 2015-09-30 NOTE — Progress Notes (Signed)
[place] OFFICE PROGRESS NOTE  Patient Care Team: Charletta Cousin, MD as PCP - General Heath Lark, MD as Consulting Physician (Hematology and Oncology)  SUMMARY OF ONCOLOGIC HISTORY:   CLL (chronic lymphocytic leukemia) ([place])   09/26/2009 Pathology Results Flow cytometry confirmed diagnosis of CLL. LEUKEMIC CELLS ARE NEGATIVE FOR CD38 AND ZAP70 EXPRESSION    INTERVAL HISTORY: Please see below for problem oriented charting. He returns for further follow-up. Denies new lymphadenopathy. No recent infection. Denies abnormal night sweats or anorexia He is very active with gardening No new skin lesions  REVIEW OF SYSTEMS:   Constitutional: Denies fevers, chills or abnormal weight loss Eyes: Denies blurriness of vision Ears, nose, mouth, throat, and face: Denies mucositis or sore throat Respiratory: Denies cough, dyspnea or wheezes Cardiovascular: Denies palpitation, chest discomfort or lower extremity swelling Gastrointestinal:  Denies nausea, heartburn or change in bowel habits Skin: Denies abnormal skin rashes Lymphatics: Denies new lymphadenopathy  Neurological:Denies numbness, tingling or new weaknesses Behavioral/Psych: Mood is stable, no new changes  All other systems were reviewed with the patient and are negative.  I have reviewed the past medical history, past surgical history, social history and family history with the patient and they are unchanged from previous note.  ALLERGIES:  has No Known Allergies.  MEDICATIONS:  Current Outpatient Prescriptions  Medication Sig Dispense Refill  . aspirin 81 MG tablet Take 81 mg by mouth daily.    Marland Kitchen co-enzyme Q-10 30 MG capsule Take 30 mg by mouth daily.    . fish oil-omega-3 fatty acids 1000 MG capsule Take 1 g by mouth daily.     Marland Kitchen lisinopril (PRINIVIL,ZESTRIL) 5 MG tablet Take 5 mg by mouth daily.    . Multiple Vitamin (MULTIVITAMIN) tablet Take 1 tablet by mouth daily.     No current facility-administered  medications for this visit.    PHYSICAL EXAMINATION: ECOG PERFORMANCE STATUS: 0 - Asymptomatic  Filed Vitals:   09/30/15 0824  BP: 149/50  Pulse: 53  Temp: 97.9 F (36.6 C)  Resp: 18   Filed Weights   09/30/15 0824  Weight: 173 lb (78.472 kg)    GENERAL:alert, no distress and comfortable SKIN: skin color, texture, turgor are normal, no rashes or significant lesions. Noted extensive skin bruises EYES: normal, Conjunctiva are pink and non-injected, sclera clear OROPHARYNX:no exudate, no erythema and lips, buccal mucosa, and tongue normal  NECK: supple, thyroid normal size, non-tender, without nodularity LYMPH:  no palpable lymphadenopathy in the cervical, axillary or inguinal LUNGS: clear to auscultation and percussion with normal breathing effort HEART: regular rate & rhythm and no murmurs and no lower extremity edema ABDOMEN:abdomen soft, non-tender and normal bowel sounds Musculoskeletal:no cyanosis of digits and no clubbing  NEURO: alert & oriented x 3 with fluent speech, no focal motor/sensory deficits  LABORATORY DATA:  I have reviewed the data as listed    Component Value Date/Time   NA 141 09/30/2015 0808   NA 141 06/27/2011 1258   K 4.5 09/30/2015 0808   K 4.0 06/27/2011 1258   CL 108* 08/25/2012 1040   CL 105 06/27/2011 1258   CO2 25 09/30/2015 0808   CO2 27 06/27/2011 1258   GLUCOSE 89 09/30/2015 0808   GLUCOSE 91 08/25/2012 1040   GLUCOSE 96 06/27/2011 1258   BUN 23.3 09/30/2015 0808   BUN 22 06/27/2011 1258   CREATININE 1.0 09/30/2015 0808   CREATININE 1.00 06/27/2011 1258   CALCIUM 8.7 09/30/2015 0808   CALCIUM 8.8 06/27/2011 1258  PROT 5.8* 09/30/2015 0808   PROT 5.9* 06/27/2011 1258   ALBUMIN 3.5 09/30/2015 0808   ALBUMIN 4.0 06/27/2011 1258   AST 16 09/30/2015 0808   AST 22 06/27/2011 1258   ALT 13 09/30/2015 0808   ALT 15 06/27/2011 1258   ALKPHOS 65 09/30/2015 0808   ALKPHOS 68 06/27/2011 1258   BILITOT 0.51 09/30/2015 0808   BILITOT  0.6 06/27/2011 1258    No results found for: SPEP, UPEP  Lab Results  Component Value Date   WBC 25.3* 09/30/2015   NEUTROABS 2.0 09/30/2015   HGB 11.3* 09/30/2015   HCT 35.0* 09/30/2015   MCV 102.0* 09/30/2015   PLT 135* 09/30/2015      Chemistry      Component Value Date/Time   NA 141 09/30/2015 0808   NA 141 06/27/2011 1258   K 4.5 09/30/2015 0808   K 4.0 06/27/2011 1258   CL 108* 08/25/2012 1040   CL 105 06/27/2011 1258   CO2 25 09/30/2015 0808   CO2 27 06/27/2011 1258   BUN 23.3 09/30/2015 0808   BUN 22 06/27/2011 1258   CREATININE 1.0 09/30/2015 0808   CREATININE 1.00 06/27/2011 1258      Component Value Date/Time   CALCIUM 8.7 09/30/2015 0808   CALCIUM 8.8 06/27/2011 1258   ALKPHOS 65 09/30/2015 0808   ALKPHOS 68 06/27/2011 1258   AST 16 09/30/2015 0808   AST 22 06/27/2011 1258   ALT 13 09/30/2015 0808   ALT 15 06/27/2011 1258   BILITOT 0.51 09/30/2015 0808   BILITOT 0.6 06/27/2011 1258      ASSESSMENT & PLAN:  CLL (chronic lymphocytic leukemia) The patient remained asymptomatic.  I recommend a yearly visit from now on with history, physical examination and blood work.  Pancytopenia, acquired ([place]) The cause is likely related to CLL. It is mild and there is little change compared from previous hemoglobin and platelet count. The patient denies recent history of bleeding such as epistaxis, hematuria or hematochezia. He is asymptomatic from the thrombocytopenia and anemia. I will observe for now.  There is no contraindication to remain on aspirin as well as blood count is greater than 50,000.    Essential hypertension His blood pressure is reasonable controlled. He will continue lisinopril and close blood pressure monitoring and medication adjustment through primary care doctor     Orders Placed This Encounter  Procedures  . CBC & Diff and Retic    Standing Status: Future     Number of Occurrences:      Standing Expiration Date: 11/03/2016   All  questions were answered. The patient knows to call the clinic with any problems, questions or concerns. No barriers to learning was detected. I spent 15 minutes counseling the patient face to face. The total time spent in the appointment was 20 minutes and more than 50% was on counseling and review of test results     [facility], [place], MD 09/30/2015 9:04 AM

## 2015-09-30 NOTE — Assessment & Plan Note (Signed)
His blood pressure is reasonable controlled. He will continue lisinopril and close blood pressure monitoring and medication adjustment through primary care doctor

## 2015-09-30 NOTE — Telephone Encounter (Signed)
Gave and printed appt sched and avs for pt for June 2018 °

## 2015-11-28 DIAGNOSIS — B351 Tinea unguium: Secondary | ICD-10-CM | POA: Diagnosis not present

## 2015-11-28 DIAGNOSIS — L570 Actinic keratosis: Secondary | ICD-10-CM | POA: Diagnosis not present

## 2015-12-02 ENCOUNTER — Telehealth: Payer: Self-pay | Admitting: *Deleted

## 2015-12-03 NOTE — Telephone Encounter (Signed)
LVM for pt informing ok w/ Dr. Alvy Bimler for pt to take Lamisal.  Please call office back if any questions.

## 2015-12-03 NOTE — Telephone Encounter (Signed)
Pt's daughter left VM on 8/18 asking if ok w/ Dr. Alvy Bimler for pt to take Lamisil for fingernail fungus?  It is ok w/ Dr. Alvy Bimler for pt to take Lamisil.

## 2016-01-20 DIAGNOSIS — Z23 Encounter for immunization: Secondary | ICD-10-CM | POA: Diagnosis not present

## 2016-02-01 ENCOUNTER — Ambulatory Visit: Payer: Medicare Other | Admitting: Cardiovascular Disease

## 2016-02-05 ENCOUNTER — Encounter (HOSPITAL_COMMUNITY): Payer: Self-pay | Admitting: Emergency Medicine

## 2016-02-05 ENCOUNTER — Emergency Department (HOSPITAL_COMMUNITY): Payer: PPO

## 2016-02-05 ENCOUNTER — Emergency Department (HOSPITAL_COMMUNITY)
Admission: EM | Admit: 2016-02-05 | Discharge: 2016-02-05 | Disposition: A | Discharge status: Home / Self Care | Payer: PPO | Attending: Emergency Medicine | Admitting: Emergency Medicine

## 2016-02-05 DIAGNOSIS — Z7982 Long term (current) use of aspirin: Secondary | ICD-10-CM | POA: Diagnosis not present

## 2016-02-05 DIAGNOSIS — I2510 Atherosclerotic heart disease of native coronary artery without angina pectoris: Secondary | ICD-10-CM | POA: Diagnosis not present

## 2016-02-05 DIAGNOSIS — I10 Essential (primary) hypertension: Secondary | ICD-10-CM | POA: Insufficient documentation

## 2016-02-05 DIAGNOSIS — R42 Dizziness and giddiness: Secondary | ICD-10-CM | POA: Diagnosis not present

## 2016-02-05 DIAGNOSIS — R600 Localized edema: Secondary | ICD-10-CM | POA: Diagnosis not present

## 2016-02-05 DIAGNOSIS — R5381 Other malaise: Secondary | ICD-10-CM | POA: Diagnosis not present

## 2016-02-05 DIAGNOSIS — Z856 Personal history of leukemia: Secondary | ICD-10-CM | POA: Diagnosis not present

## 2016-02-05 DIAGNOSIS — I447 Left bundle-branch block, unspecified: Secondary | ICD-10-CM | POA: Insufficient documentation

## 2016-02-05 LAB — URINALYSIS, ROUTINE W REFLEX MICROSCOPIC
BILIRUBIN URINE: NEGATIVE
Glucose, UA: NEGATIVE mg/dL
HGB URINE DIPSTICK: NEGATIVE
Ketones, ur: NEGATIVE mg/dL
Leukocytes, UA: NEGATIVE
Nitrite: NEGATIVE
PH: 6 (ref 5.0–8.0)
Protein, ur: NEGATIVE mg/dL
SPECIFIC GRAVITY, URINE: 1.024 (ref 1.005–1.030)

## 2016-02-05 LAB — BASIC METABOLIC PANEL
ANION GAP: 4 — AB (ref 5–15)
BUN: 15 mg/dL (ref 6–20)
CALCIUM: 8.5 mg/dL — AB (ref 8.9–10.3)
CO2: 26 mmol/L (ref 22–32)
Chloride: 109 mmol/L (ref 101–111)
Creatinine, Ser: 0.95 mg/dL (ref 0.61–1.24)
GFR calc Af Amer: >60 mL/min (ref >60)
GFR calc non Af Amer: >60 mL/min (ref >60)
GLUCOSE: 115 mg/dL — AB (ref 65–99)
Potassium: 4.2 mmol/L (ref 3.5–5.1)
Sodium: 139 mmol/L (ref 135–145)

## 2016-02-05 LAB — CBC
HCT: 34.6 % — ABNORMAL LOW (ref 39.0–52.0)
HEMOGLOBIN: 11.1 g/dL — AB (ref 13.0–17.0)
MCH: 32.7 pg (ref 26.0–34.0)
MCHC: 32.1 g/dL (ref 30.0–36.0)
MCV: 102.1 fL — ABNORMAL HIGH (ref 78.0–100.0)
Platelets: 123 10*3/uL — ABNORMAL LOW (ref 150–400)
RBC: 3.39 MIL/uL — ABNORMAL LOW (ref 4.22–5.81)
RDW: 13.4 % (ref 11.5–15.5)
WBC: 27 10*3/uL — ABNORMAL HIGH (ref 4.0–10.5)

## 2016-02-05 LAB — I-STAT TROPONIN, ED: TROPONIN I, POC: 0.01 ng/mL (ref 0.00–0.08)

## 2016-02-05 NOTE — ED Provider Notes (Signed)
18:40- I was asked by Dr. Johnney Killian to evaluate the patient, after return of MRI results being done to evaluate for CVA. Results of return. Negative for acute CVA. Nursing has contacted me, with concern for "bradycardia".  At this time . He is alert, calm, cooperative. He states his dizziness has resolved. Previously. The dizziness was there only when standing. He does not recall that it was there with head movement. He did not have pain of any type. Yesterday he was working outside and is typically very active. Today he has been eating well. Family stated that he always has a "low heart rate".  Orthostatic blood pressure, pulses were done, and there is no orthostasis. The patient was able to ambulate about 125 feet, without symptoms. The patient, his family members are comfortable with discharge. We discussed following up with a neurologist, but dizziness, and PCP about bradycardia and left bundle branch block. There is no indication for medication for dizziness at this time.  MDM- nonspecific dizziness. Doubt CVA, TIA, metabolic instability or severe infectious process. Differential diagnosis includes volume depletion, and peripheral dizziness syndromes.   Daleen Bo, MD 02/05/16 [phone]

## 2016-02-05 NOTE — ED Provider Notes (Signed)
[place] DEPT Provider Note   CSN: [id number] Arrival date & time: 02/05/16  1353     History   Chief Complaint Chief Complaint  Patient presents with  . Dizziness    HPI Gregory Rodgers is a 82 y.o. male.  HPI Patient has dizziness started yesterday evening. He had worked all day and his forearm without difficulty. He had loaded up awakened with pumpkins. After he completed all his tasks he had onset of dizziness that was spinning in quality. He reports he felt very off balance and like he needed to hold onto things to walk. He did not have any chest pain or shortness of breath. He did not have a syncopal episode. There was no focal weakness numbness or tingling. Symptoms persisted today. At this time however patient reports the symptoms are much improved and he is able to move and change position without feeling this spinning dizziness. Initially he had required assistance from family members to stabilize him while he was walking. Past Medical History:  Diagnosis Date  . Borderline diabetes   . CLL (chronic lymphocytic leukemia) ([place]) 09/2009   stage 0; on observation  . Coronary artery disease   . HTN (hypertension)   . Hyperlipidemia     Patient Active Problem List   Diagnosis Date Noted  . Pancytopenia, acquired ([employer]) 09/30/2015  . Thrombocytopenia ([place]) 10/01/2014  . 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 CLL (chronic lymphocytic leukemia) ([place]) 06/20/2011  . Hyperlipidemia 06/20/2011  . Essential hypertension 06/20/2011  . H/O angioplasty 06/20/2011  . Borderline diabetes 06/20/2011  . Coronary artery disease     Past Surgical History:  Procedure Laterality Date  . REPLACEMENT TOTAL KNEE         Home Medications    Prior to Admission medications   Medication Sig Start Date End Date Taking? Authorizing Provider  aspirin 81 MG tablet Take 81 mg by mouth daily.   Yes Historical Provider, MD  Calcium Carbonate-Simethicone (MAALOX MAX PO) Take 5 mLs  by mouth daily as needed (upset stomach).   Yes Historical Provider, MD  co-enzyme Q-10 30 MG capsule Take 30 mg by mouth daily.   Yes Historical Provider, MD  fish oil-omega-3 fatty acids 1000 MG capsule Take 1 g by mouth daily.    Yes Historical Provider, MD  ibuprofen (ADVIL,MOTRIN) 200 MG tablet Take 200 mg by mouth every 6 (six) hours as needed for mild pain.   Yes Historical Provider, MD  lisinopril (PRINIVIL,ZESTRIL) 5 MG tablet Take 5 mg by mouth daily. 08/04/12  Yes Historical Provider, MD  Multiple Vitamin (MULTIVITAMIN) tablet Take 1 tablet by mouth daily.   Yes Historical Provider, MD    Family History No family history on file.  Social History Social History  Substance Use Topics  . Smoking status: Never Smoker  . Smokeless tobacco: Never Used  . Alcohol use No     Allergies   Review of patient's allergies indicates no known allergies.   Review of Systems Review of Systems  10 Systems reviewed and are negative for acute change except as noted in the HPI.  Physical Exam Updated Vital Signs BP (!) 139/49   Pulse (!) 27   Temp 97.7 F (36.5 C) (Oral)   Resp 18   Ht 5\' 10"  (1.778 m)   Wt 170 lb (77.1 kg)   SpO2 98%   BMI 24.39 kg/m   Physical Exam  Constitutional: He is oriented to person, place, and time. He appears well-developed and well-nourished.  HENT:  Head: Normocephalic and atraumatic.  Bilateral normal TMs. Oral cavity pink and moist.  Eyes: Conjunctivae and EOM are normal.  Prior cataract repair on the right. Teardrop shaped pupil. Left pupil normal.  Neck: Neck supple.  Cardiovascular: Regular rhythm and intact distal pulses.   No murmur heard. Bradycardia.  Pulmonary/Chest: Effort normal and breath sounds normal. No respiratory distress.  Abdominal: Soft. There is no tenderness.  Musculoskeletal: Normal range of motion. He exhibits edema. He exhibits no tenderness or deformity.  Left lower extremity 1+ edema compared to the right. Patient  and wife reports this is chronic since his distant left knee surgery. There has been no change in no associated pain.  Neurological: He is alert and oriented to person, place, and time. No cranial nerve deficit. He exhibits normal muscle tone. Coordination normal.  Cognitive function normal speech normal upper and lower motor strength 5\5. No pronator drift.  Skin: Skin is warm and dry.  Psychiatric: He has a normal mood and affect.  Nursing note and vitals reviewed.    ED Treatments / Results  Labs (all labs ordered are listed, but only abnormal results are displayed) Labs Reviewed  BASIC METABOLIC PANEL - Abnormal; Notable for the following:       Result Value   Glucose, Bld 115 (*)    Calcium 8.5 (*)    Anion gap 4 (*)    All other components within normal limits  CBC - Abnormal; Notable for the following:    WBC 27.0 (*)    RBC 3.39 (*)    Hemoglobin 11.1 (*)    HCT 34.6 (*)    MCV 102.1 (*)    Platelets 123 (*)    All other components within normal limits  URINALYSIS, ROUTINE W REFLEX MICROSCOPIC (NOT AT [facility])  I-STAT TROPOININ, ED    EKG  EKG Interpretation  Date/Time:  Sunday February 05 2016 14:03:13 EDT Ventricular Rate:  54 PR Interval:  236 QRS Duration: 164 QT Interval:  484 QTC Calculation: 458 R Axis:   51 Text Interpretation:  Sinus bradycardia with 1st degree A-V block Left bundle branch block Abnormal ECG agree no old comparison. Confirmed by Johnney Killian, MD, Jeannie Done [phone]) on 02/05/2016 2:56:53 PM       Radiology No results found.  Procedures Procedures (including critical care time)  Medications Ordered in ED Medications - No data to display   Initial Impression / Assessment and Plan / ED Course  I have reviewed the triage vital signs and the nursing notes.  Pertinent labs & imaging results that were available during my care of the patient were reviewed by me and considered in my medical decision making (see chart for details).  Clinical  Course    Final Clinical Impressions(s) / ED Diagnoses   Final diagnoses:  Dizziness  Vertigo  Left bundle branch block   Patient's symptoms by history are most consistent with neurologic etiology. He describes symptoms of vertigo without chest pain, shortness of breath, palpitations or syncope type symptoms. His neurologic exam is normal at this time. Symptoms had resolved shortly prior to arouse the emergency department. Plan will be to pursue MRI to rule out a cerebellar CVA. Patient does have a left bundle branch on EKG. I do not have comparison EKG. This does not however appear to be the likely etiology of patient's symptoms. Upon completion of MRI will plan to ambulate the patient and determine if there is any lightheadedness or symptomatic bradycardia. Dr. Eulis Foster will follow-up with  this and determine if patient is appropriate for discharge or observation status. New Prescriptions New Prescriptions   No medications on file     Charlesetta Shanks, MD 02/05/16 1720

## 2016-02-05 NOTE — ED Triage Notes (Signed)
Patient complains of dizziness since yesterday evening. Sent from [facility] for further evaluation. Patient states that last night he couldn't rest because of the nausea and the bed spinning due to the dizziness. Alert and oriented, no CP

## 2016-02-13 ENCOUNTER — Ambulatory Visit: Payer: PPO | Admitting: Cardiology

## 2016-02-13 DIAGNOSIS — H353131 Nonexudative age-related macular degeneration, bilateral, early dry stage: Secondary | ICD-10-CM | POA: Diagnosis not present

## 2016-02-21 ENCOUNTER — Encounter: Payer: Self-pay | Admitting: Cardiology

## 2016-02-24 ENCOUNTER — Ambulatory Visit: Payer: PPO | Admitting: Cardiology

## 2016-02-27 ENCOUNTER — Ambulatory Visit (INDEPENDENT_AMBULATORY_CARE_PROVIDER_SITE_OTHER): Payer: PPO | Admitting: Cardiology

## 2016-02-27 ENCOUNTER — Encounter (INDEPENDENT_AMBULATORY_CARE_PROVIDER_SITE_OTHER): Payer: PPO

## 2016-02-27 ENCOUNTER — Encounter: Payer: Self-pay | Admitting: Cardiology

## 2016-02-27 DIAGNOSIS — I2510 Atherosclerotic heart disease of native coronary artery without angina pectoris: Secondary | ICD-10-CM | POA: Diagnosis not present

## 2016-02-27 DIAGNOSIS — R42 Dizziness and giddiness: Secondary | ICD-10-CM | POA: Diagnosis not present

## 2016-02-27 DIAGNOSIS — I255 Ischemic cardiomyopathy: Secondary | ICD-10-CM | POA: Diagnosis not present

## 2016-02-27 DIAGNOSIS — Z9861 Coronary angioplasty status: Secondary | ICD-10-CM

## 2016-02-27 DIAGNOSIS — I447 Left bundle-branch block, unspecified: Secondary | ICD-10-CM | POA: Insufficient documentation

## 2016-02-27 DIAGNOSIS — R001 Bradycardia, unspecified: Secondary | ICD-10-CM | POA: Insufficient documentation

## 2016-02-27 NOTE — Progress Notes (Signed)
PCP: Gregory Cousin, MD (Inactive)  Clinic Note: Chief Complaint  Patient presents with  . New Patient (Initial Visit)    Dizziness  . Coronary Artery Disease    history of PTCA of RCA  Bradycardia  Dizziness - spells last (all night long making him go to ER) -- dizziness with moving around.  The Sunday AM episode, unable to even raise head up.  Has had intermittent spells for ~10 yrs.   HPI: Gregory Rodgers is a 82 y.o. male with a PMH below who presents today for Cardiology Consultation for bradycardia & dizziness.  He had PTCA in 1986-86 for MI (@ [facility]).  Saw Dr. Velora Heckler for a few yrs.    Gregory Rodgers was seen in the emergency room on October 22 for prolonged episode of dizziness. Unfortunately by the time he got to the ER his dizziness has improved. As long as he lies still in bed he was fine. Upon evaluation in the ER is noted to have bradycardia with left bundle branch block (left bundle branch block is likely not new). No evidence of orthostasis. He had a negative evaluation for TIA/stroke was felt to likely be related to volume depletion.  Studies Reviewed:   Echo 2008: Mildly reduced EF of 40 and 45% with hypokinesis of the posterior wall. Diastolic dysfunction. Mild AI and mild aortic root dilation. Mild MR.  Interval History: [name] is a very pleasant gentleman with a very distant history of inferior MI with PTCA to the RCA back in 1986-87. Follow-up with Dr. Peter Minium for a few years, he then was lost to follow-up. Gregory Rodgers is a otherwise healthy 82 year old he was very active. He says he can walk a mile or 2 without any difficulty. He would ride his bike as well without any symptoms. He does yard work, Special educational needs teacher. Other than the episodes of feeling dizzy, he has been doing quite well. He has intermittent episodes lasting 4-5 hours of just complete dizziness. A few weeks ago on Sunday morning he was so dizzy that he was unable even get up out of bed. He couldn't lift his head  up out of bed. He did not even dare to walk. This lasted several hours and he had some nausea but without true emesis. After the dizziness resolved, he is just felt tired for about several hours before he fully recovered after the spells. He has had one or 2 episodes of less severity over the past couple months, but the most significant episode was as described. He denied any chest tightness or pressure associated with it. [place] note any arrhythmias of tachycardia that symptoms or palpitations. He did not feel as though he would truly pass out, but no syncope. No unilateral weakness or vision change to suggest TIA or amaurosis fugax.   He otherwise denies any resting or exertional chest tightness or pressure. No PND, orthopnea. He does have some left ankle swelling which is more related to his prior knee surgery.  No claudication.  ROS: A comprehensive was performed. Review of Systems  Constitutional: Positive for malaise/fatigue (Following dizzy spells per history of present illness).  HENT: Positive for hearing loss. Negative for congestion and nosebleeds.   Respiratory: Negative for cough, shortness of breath and wheezing.   Cardiovascular: Positive for leg swelling (Only left leg noted in history of present illness).  Gastrointestinal: Negative for blood in stool, constipation and melena.  Genitourinary: Negative for hematuria.  Musculoskeletal: Positive for joint pain (Intermittent left knee pain and swelling  from prior surgery).  Neurological: Positive for dizziness (Per history of present illness). Negative for seizures and loss of consciousness.  Psychiatric/Behavioral: Negative for depression and memory loss. The patient is not nervous/anxious and does not have insomnia.   All other systems reviewed and are negative.   Past Medical History:  Diagnosis Date  . Borderline diabetes   . CLL (chronic lymphocytic leukemia) ([facility]) 09/2009   stage 0; on observation  . Coronary artery  disease involving native heart without angina pectoris    PTCA of RCA in setting of an MI in 1986-87; no follow-up study since available.  . Essential hypertension   . Hyperlipidemia with target LDL less than 70    With known CAD  . Left bundle branch block (LBBB) on electrocardiogram    Noted as far back as 2008.  . 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 REPLACEMENT TOTAL KNEE     Prior to Admission medications   Medication Sig Start Date End Date Taking? Authorizing Provider  aspirin 81 MG tablet Take 81 mg by mouth daily.   Yes Historical Provider, MD  Calcium Carbonate-Simethicone (MAALOX MAX PO) Take 5 mLs by mouth daily as needed (upset stomach).   Yes Historical Provider, MD  co-enzyme Q-10 30 MG capsule Take 30 mg by mouth daily.   Yes Historical Provider, MD  fish oil-omega-3 fatty acids 1000 MG capsule Take 1 g by mouth daily.    Yes Historical Provider, MD  ibuprofen (ADVIL,MOTRIN) 200 MG tablet Take 200 mg by mouth every 6 (six) hours as needed for mild pain.   Yes Historical Provider, MD  lisinopril (PRINIVIL,ZESTRIL) 5 MG tablet Take 5 mg by mouth daily. 08/04/12  Yes Historical Provider, MD  Multiple Vitamin (MULTIVITAMIN) tablet Take 1 tablet by mouth daily.   Yes Historical Provider, MD   No Known Allergies   Social History   Social History  . Marital status: Married    Spouse name: N/A  . Number of children: N/A  . Years of education: N/A   Social History Main Topics  . Smoking status: Never Smoker  . Smokeless tobacco: Never Used  . Alcohol use No  . Drug use: No  . Sexual activity: Not Asked   Other Topics Concern  . None   Social History Narrative  . None    @famh @  Wt Readings from Last 3 Encounters:  02/27/16 79 kg (174 lb 3.2 oz)  02/05/16 77.1 kg (170 lb)  09/30/15 78.5 kg (173 lb)    PHYSICAL EXAM BP (!) 142/70 (BP Location: Left Arm,  Cuff Size: Normal)   Pulse (!) 49   Ht 5\' 9"  (1.753 m)   Wt 79 kg (174 lb 3.2 oz)   BMI 25.72 kg/m  General appearance: alert, cooperative, appears stated age, no distress and well-nourished and well-groomed. Quite hard of hearing. HEENT: [place]/AT, EOMI, MMM, anicteric sclera; right pupil is irregular shaped from cataract surgery. Left is round and reactive to light Neck: no adenopathy, no carotid bruit and no JVD Lungs: clear to auscultation bilaterally, normal percussion bilaterally and non-labored Heart: Bradycardic rate and regular rhythm, S1 normal with split S2, no murmur, click, rub or gallop; non-displaced PMI & occasional Ectopy. Abdomen: soft, non-tender; bowel sounds normal; no masses,  no organomegaly; no HJR Extremities: extremities normal, atraumatic, no cyanosis, and edema - only noted L Knee & ankle Pulses:  2+ and symmetric; Skin: mobility and turgor normal, no evidence of bleeding or bruising and no lesions noted Neurologic: Mental status: Alert, oriented, thought content appropriate Cranial nerves: normal (II-XII grossly intact) - but hard of hearing. Slow & deliberate, but steady gait.    Adult ECG Report  Rate: 49 ;  Rhythm: sinus bradycardia and indeterminate;  LBBB  Narrative Interpretation: Stable EKG - rate slower than most recent ED evaluation.   Other studies Reviewed: Additional studies/ records that were reviewed today include:  Recent Labs:     Chemistry      Component Value Date/Time   NA 139 02/05/2016 1410   NA 141 09/30/2015 0808   K 4.2 02/05/2016 1410   K 4.5 09/30/2015 0808   CL 109 02/05/2016 1410   CL 108 (H) 08/25/2012 1040   CO2 26 02/05/2016 1410   CO2 25 09/30/2015 0808   BUN 15 02/05/2016 1410   BUN 23.3 09/30/2015 0808   CREATININE 0.95 02/05/2016 1410   CREATININE 1.0 09/30/2015 0808      Component Value Date/Time   CALCIUM 8.5 (L) 02/05/2016 1410   CALCIUM 8.7 09/30/2015 0808   ALKPHOS 65 09/30/2015 0808   AST 16 09/30/2015  0808   ALT 13 09/30/2015 0808   BILITOT 0.51 09/30/2015 0808       ASSESSMENT / PLAN: Problem List Items Addressed This Visit    Ischemic cardiomyopathy: EF 40-45% (Chronic)    With left bundle-branch block and now dizziness spells that would be concerning for possible systematic bradycardia, we will reassess EF to determine if there is been any bundle-branch related cardiomyopathy exacerbation. This will also allow [place] to evaluate wall motion.  He seems euvolemic, does not require any diuretic. He is on an ACE inhibitor. Again with bradycardia, beta blocker.      Relevant Orders   EKG 12-Lead (Completed)   ECHOCARDIOGRAM COMPLETE   Cardiac event monitor   CAD S/P percutaneous coronary angioplasty (Chronic)    No anginal symptoms. I don't think is any residual ischemic evaluation He is on aspirin and ACE inhibitor. Not on statin due to intolerance and not on beta blocker because of bradycardia.  Has not had an assessment of his cardiac function since 2008. Plan: 2-D echo      Left bundle branch block (LBBB) on electrocardiogram (Chronic)    This is an old finding, and has been associated with bradycardia. Plan is to wear a 30 day event monitor to assess for any symptomatic bradycardia. Also will check 2-D echocardiogram to reassess cardiac function and wall motion.      Relevant Orders   EKG 12-Lead (Completed)   ECHOCARDIOGRAM COMPLETE   Cardiac event monitor   Dizziness of unknown cause    I am not as sure what these dizzy spells are. Unfortunately they don't have enough for [place] to be a capture them. He didn't have any sensation of arrhythmia so hard time suggesting that these are arrhythmogenic in nature.  Do agree that they could be related to hypo-tension or hypovolemia, but his blood pressure is relatively stable here if anything elevated. I would allow for permissive hypertension. Avoid dehydration.  Perhaps more related to his bradycardia and left bundle branch block that  he does not have episodic complete block as etiology for his dizziness we will have him wear a monitor for 30 days.  This would be ensure he doesn't require pacemaker.      Relevant Orders   EKG 12-Lead (Completed)  ECHOCARDIOGRAM COMPLETE   Cardiac event monitor   Bradycardia with 41-50 beats per minute    Unsure of his dizzy spells related to symptomatically bradycardia. Plan: 30 monitor. Hopefully we will be able to capture and episode.      Relevant Orders   EKG 12-Lead (Completed)   ECHOCARDIOGRAM COMPLETE   Cardiac event monitor      Current medicines are reviewed at length with the patient today. (+/- concerns) none The following changes have been made: none  Patient Instructions  SCHEDULE AT [place] has requested that you have an echocardiogram. Echocardiography is a painless test that uses sound waves to create images of your heart. It provides your doctor with information about the size and shape of your heart and how well your heart's chambers and valves are working. This procedure takes approximately one hour. There are no restrictions for this procedure. \   Your physician has recommended that you wear an event monitor WEAR FOR 30 DAYS. Event monitors are medical devices that record the heart's electrical activity. Doctors most often [place] these monitors to diagnose arrhythmias. Arrhythmias are problems with the speed or rhythm of the heartbeat. The monitor is a small, portable device. You can wear one while you do your normal daily activities. This is usually used to diagnose what is causing palpitations/syncope (passing out).    NO CHANGES WITH CURRENT MEDICATIONS  Your physician recommends that you schedule a follow-up appointment in: 7-8 August.    Studies Ordered:   Orders Placed This Encounter  Procedures  . Cardiac event monitor  . EKG 12-Lead  . ECHOCARDIOGRAM COMPLETE      Glenetta Hew,  M.D., M.S. Interventional Cardiologist   Pager # [phone] Phone # [phone] [address] [place], [place] [address]

## 2016-02-27 NOTE — Patient Instructions (Signed)
SCHEDULE AT [facility] has requested that you have an echocardiogram. Echocardiography is a painless test that uses sound waves to create images of your heart. It provides your doctor with information about the size and shape of your heart and how well your heart's chambers and valves are working. This procedure takes approximately one hour. There are no restrictions for this procedure. \   Your physician has recommended that you wear an event monitor WEAR FOR 30 DAYS. Event monitors are medical devices that record the heart's electrical activity. Doctors most often [place] these monitors to diagnose arrhythmias. Arrhythmias are problems with the speed or rhythm of the heartbeat. The monitor is a small, portable device. You can wear one while you do your normal daily activities. This is usually used to diagnose what is causing palpitations/syncope (passing out).    NO CHANGES WITH CURRENT MEDICATIONS  Your physician recommends that you schedule a follow-up appointment in: 7-8 [place].

## 2016-02-28 ENCOUNTER — Encounter: Payer: Self-pay | Admitting: Cardiology

## 2016-02-28 NOTE — Assessment & Plan Note (Signed)
No anginal symptoms. I don't think is any residual ischemic evaluation He is on aspirin and ACE inhibitor. Not on statin due to intolerance and not on beta blocker because of bradycardia.  Has not had an assessment of his cardiac function since 2008. Plan: 2-D echo

## 2016-02-28 NOTE — Assessment & Plan Note (Signed)
Unsure of his dizzy spells related to symptomatically bradycardia. Plan: 30 monitor. Hopefully we will be able to capture and episode.

## 2016-02-28 NOTE — Assessment & Plan Note (Addendum)
With left bundle-branch block and now dizziness spells that would be concerning for possible systematic bradycardia, we will reassess EF to determine if there is been any bundle-branch related cardiomyopathy exacerbation. This will also allow [place] to evaluate wall motion.  He seems euvolemic, does not require any diuretic. He is on an ACE inhibitor. Again with bradycardia, beta blocker.

## 2016-02-28 NOTE — Assessment & Plan Note (Signed)
This is an old finding, and has been associated with bradycardia. Plan is to wear a 30 day event monitor to assess for any symptomatic bradycardia. Also will check 2-D echocardiogram to reassess cardiac function and wall motion.

## 2016-02-28 NOTE — Assessment & Plan Note (Signed)
I am not as sure what these dizzy spells are. Unfortunately they don't have enough for [place] to be a capture them. He didn't have any sensation of arrhythmia so hard time suggesting that these are arrhythmogenic in nature.  Do agree that they could be related to hypo-tension or hypovolemia, but his blood pressure is relatively stable here if anything elevated. I would allow for permissive hypertension. Avoid dehydration.  Perhaps more related to his bradycardia and left bundle branch block that he does not have episodic complete block as etiology for his dizziness we will have him wear a monitor for 30 days.  This would be ensure he doesn't require pacemaker.

## 2016-03-13 DIAGNOSIS — Z1389 Encounter for screening for other disorder: Secondary | ICD-10-CM | POA: Diagnosis not present

## 2016-03-13 DIAGNOSIS — Z1211 Encounter for screening for malignant neoplasm of colon: Secondary | ICD-10-CM | POA: Diagnosis not present

## 2016-03-13 DIAGNOSIS — R7303 Prediabetes: Secondary | ICD-10-CM | POA: Diagnosis not present

## 2016-03-13 DIAGNOSIS — I10 Essential (primary) hypertension: Secondary | ICD-10-CM | POA: Diagnosis not present

## 2016-03-13 DIAGNOSIS — Z125 Encounter for screening for malignant neoplasm of prostate: Secondary | ICD-10-CM | POA: Diagnosis not present

## 2016-03-13 DIAGNOSIS — E559 Vitamin D deficiency, unspecified: Secondary | ICD-10-CM | POA: Diagnosis not present

## 2016-03-13 DIAGNOSIS — Z0000 Encounter for general adult medical examination without abnormal findings: Secondary | ICD-10-CM | POA: Diagnosis not present

## 2016-03-13 DIAGNOSIS — E785 Hyperlipidemia, unspecified: Secondary | ICD-10-CM | POA: Diagnosis not present

## 2016-03-16 DIAGNOSIS — I420 Dilated cardiomyopathy: Secondary | ICD-10-CM

## 2016-03-16 HISTORY — DX: Dilated cardiomyopathy: I42.0

## 2016-03-16 HISTORY — PX: TRANSTHORACIC ECHOCARDIOGRAM: SHX275

## 2016-03-21 ENCOUNTER — Ambulatory Visit (HOSPITAL_COMMUNITY): Payer: PPO | Attending: Cardiology

## 2016-03-21 ENCOUNTER — Other Ambulatory Visit: Payer: Self-pay

## 2016-03-21 ENCOUNTER — Encounter (INDEPENDENT_AMBULATORY_CARE_PROVIDER_SITE_OTHER): Payer: Self-pay

## 2016-03-21 DIAGNOSIS — I255 Ischemic cardiomyopathy: Secondary | ICD-10-CM | POA: Insufficient documentation

## 2016-03-21 DIAGNOSIS — I340 Nonrheumatic mitral (valve) insufficiency: Secondary | ICD-10-CM | POA: Diagnosis not present

## 2016-03-21 DIAGNOSIS — I447 Left bundle-branch block, unspecified: Secondary | ICD-10-CM | POA: Insufficient documentation

## 2016-03-21 DIAGNOSIS — I351 Nonrheumatic aortic (valve) insufficiency: Secondary | ICD-10-CM | POA: Insufficient documentation

## 2016-03-21 DIAGNOSIS — I429 Cardiomyopathy, unspecified: Secondary | ICD-10-CM | POA: Insufficient documentation

## 2016-03-21 DIAGNOSIS — D649 Anemia, unspecified: Secondary | ICD-10-CM | POA: Diagnosis not present

## 2016-03-21 DIAGNOSIS — C9110 Chronic lymphocytic leukemia of B-cell type not having achieved remission: Secondary | ICD-10-CM | POA: Diagnosis not present

## 2016-03-21 DIAGNOSIS — R42 Dizziness and giddiness: Secondary | ICD-10-CM | POA: Insufficient documentation

## 2016-03-21 DIAGNOSIS — I119 Hypertensive heart disease without heart failure: Secondary | ICD-10-CM | POA: Diagnosis not present

## 2016-03-21 DIAGNOSIS — R9431 Abnormal electrocardiogram [ECG] [EKG]: Secondary | ICD-10-CM | POA: Diagnosis not present

## 2016-03-21 DIAGNOSIS — I2510 Atherosclerotic heart disease of native coronary artery without angina pectoris: Secondary | ICD-10-CM | POA: Insufficient documentation

## 2016-03-21 DIAGNOSIS — R001 Bradycardia, unspecified: Secondary | ICD-10-CM | POA: Diagnosis not present

## 2016-03-21 DIAGNOSIS — E785 Hyperlipidemia, unspecified: Secondary | ICD-10-CM | POA: Insufficient documentation

## 2016-03-21 DIAGNOSIS — R002 Palpitations: Secondary | ICD-10-CM | POA: Diagnosis not present

## 2016-03-27 DIAGNOSIS — R001 Bradycardia, unspecified: Secondary | ICD-10-CM | POA: Diagnosis not present

## 2016-03-27 DIAGNOSIS — I447 Left bundle-branch block, unspecified: Secondary | ICD-10-CM | POA: Diagnosis not present

## 2016-03-27 DIAGNOSIS — I255 Ischemic cardiomyopathy: Secondary | ICD-10-CM

## 2016-03-27 DIAGNOSIS — R42 Dizziness and giddiness: Secondary | ICD-10-CM | POA: Diagnosis not present

## 2016-04-03 DIAGNOSIS — Z0000 Encounter for general adult medical examination without abnormal findings: Secondary | ICD-10-CM | POA: Diagnosis not present

## 2016-05-01 ENCOUNTER — Ambulatory Visit (INDEPENDENT_AMBULATORY_CARE_PROVIDER_SITE_OTHER): Payer: PPO | Admitting: Cardiology

## 2016-05-01 ENCOUNTER — Encounter: Payer: Self-pay | Admitting: Cardiology

## 2016-05-01 VITALS — BP 153/68 | HR 55 | Ht 70.0 in | Wt 173.2 lb

## 2016-05-01 DIAGNOSIS — I4729 Other ventricular tachycardia: Secondary | ICD-10-CM

## 2016-05-01 DIAGNOSIS — I472 Ventricular tachycardia: Secondary | ICD-10-CM | POA: Diagnosis not present

## 2016-05-01 DIAGNOSIS — I2510 Atherosclerotic heart disease of native coronary artery without angina pectoris: Secondary | ICD-10-CM

## 2016-05-01 DIAGNOSIS — I255 Ischemic cardiomyopathy: Secondary | ICD-10-CM | POA: Diagnosis not present

## 2016-05-01 DIAGNOSIS — I447 Left bundle-branch block, unspecified: Secondary | ICD-10-CM | POA: Diagnosis not present

## 2016-05-01 DIAGNOSIS — C9110 Chronic lymphocytic leukemia of B-cell type not having achieved remission: Secondary | ICD-10-CM

## 2016-05-01 DIAGNOSIS — I10 Essential (primary) hypertension: Secondary | ICD-10-CM | POA: Diagnosis not present

## 2016-05-01 DIAGNOSIS — R001 Bradycardia, unspecified: Secondary | ICD-10-CM

## 2016-05-01 DIAGNOSIS — Z9861 Coronary angioplasty status: Secondary | ICD-10-CM

## 2016-05-01 MED ORDER — LISINOPRIL 10 MG PO TABS: 10.0000 mg | ORAL_TABLET | Freq: Every day | ORAL | 3 refills | Status: DC

## 2016-05-01 NOTE — Patient Instructions (Signed)
Medication Instructions:  INCREASE lisinopril to 10mg  once daily.  Labwork: NONE  Testing/Procedures: NONE  Follow-Up: Follow up with Dr. Ellyn Hack in 2-3 months.  Any Other Special Instructions Will Be Listed Below (If Applicable).  A referral has been sent to see an cardiac electrophysiologist at our [facility] [phone] [address]).    If you need a refill on your cardiac medications before your next appointment, please call your pharmacy.

## 2016-05-01 NOTE — Progress Notes (Signed)
PCP: Gregory Cousin, MD (Inactive)  Clinic Note: Chief Complaint  Patient presents with  . Follow-up    echo, montior;  . Leg Pain    cramping in legs; rarely.   Bradycardia    HPI: Gregory Rodgers is a 83 y.o. male with a PMH below who presents today for Cardiology Consultation for bradycardia & dizziness.  He had PTCA in 1986-86 for MI (@ [facility]).  Saw Dr. Velora Heckler for a few yrs.    Gregory Rodgers was last seen in mid November 2017 following an ER visit in October for a prolonged episode of profoudn weakness & dizziness. Dizziness - spells last (all night long making him go to ER) -- dizziness with moving around.  The Sunday AM episode, unable to even raise head up.  Has had intermittent spells for ~10 yrs.   Studies Reviewed:   Echo 2008: Mildly reduced EF of 40 and 45% with hypokinesis of the posterior wall. Diastolic dysfunction. Mild AI and mild aortic root dilation. Mild MR.  Monitor = Mostly SR frequent PVCs, 1 9 beat run of NSVT  Echo - EF ~30%: Normal LV size with mild LV hypertrophy. EF 30%. Diffuse hypokinesis wtih septal-lateral dyssynchrony. Normal RV size and systolic function. Mild MR and mild AI.  He is accompanied today by his wife & daughter.  Interval History:  Gregory Rodgers is a otherwise healthy 83 year old he was very active with his daily walking & bicycle riding. He has not had any further "episodes" of lightheadedness / dizziness like he had before.  He wore the monitor, but did not recall pushing the button. He denies any resting or exertional chest tightness or pressure. No further episodes of dizziness or wooziness like before. No sensation of rapid irregular heartbeats just occasional "skipped beats ". No heart failure symptoms of PND, orthopnea, but still notes some left ankle swelling which is more related to his prior knee surgery.  No claudication.  ROS: A comprehensive was performed. Review of Systems  Constitutional: Positive for malaise/fatigue (No  further spells).  HENT: Positive for hearing loss. Negative for congestion and nosebleeds.   Respiratory: Negative for cough, shortness of breath and wheezing.   Cardiovascular: Positive for leg swelling (Only left leg noted in history of present illness).  Gastrointestinal: Negative for blood in stool, constipation and melena.  Genitourinary: Negative for hematuria.  Musculoskeletal: Positive for joint pain (Intermittent left knee pain and swelling from prior surgery).  Neurological: Negative for seizures and loss of consciousness. Dizziness: Per history of present illness.  Psychiatric/Behavioral: Negative for depression and memory loss. The patient is not nervous/anxious and does not have insomnia.   All other systems reviewed and are negative.   Past Medical History:  Diagnosis Date  . Borderline diabetes   . CLL (chronic lymphocytic leukemia) ([place]) 09/2009   stage 0; on observation  . Coronary artery disease involving native heart without angina pectoris    PTCA of RCA in setting of an MI in 1986-87; no follow-up study since available.  . Essential hypertension   . Hyperlipidemia with target LDL less than 70    With known CAD  . Left bundle branch block (LBBB) on electrocardiogram    Noted as far back as 2008.  . 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 REPLACEMENT TOTAL KNEE     Current Meds  Medication Sig  . aspirin 81 MG  tablet Take 81 mg by mouth daily.  . Calcium Carbonate-Simethicone (MAALOX MAX PO) Take 5 mLs by mouth daily as needed (upset stomach).  . co-enzyme Q-10 30 MG capsule Take 30 mg by mouth daily.  . fish oil-omega-3 fatty acids 1000 MG capsule Take 1 g by mouth daily.   Marland Kitchen ibuprofen (ADVIL,MOTRIN) 200 MG tablet Take 200 mg by mouth every 6 (six) hours as needed for mild pain.  Marland Kitchen lisinopril (PRINIVIL,ZESTRIL) 10 MG tablet Take 1 tablet (10 mg total) by mouth  daily.  . Multiple Vitamin (MULTIVITAMIN) tablet Take 1 tablet by mouth daily.  . [DISCONTINUED] lisinopril (PRINIVIL,ZESTRIL) 5 MG tablet Take 5 mg by mouth daily.    No Known Allergies   Social History   Social History  . Marital status: Married    Spouse name: N/A  . Number of children: N/A  . Years of education: N/A   Social History Main Topics  . Smoking status: Never Smoker  . Smokeless tobacco: Never Used  . Alcohol use No  . Drug use: No  . Sexual activity: Not Asked   Other Topics Concern  . None   Social History Narrative  . None    History reviewed. No pertinent family history. - Non-contributory due to age & co-morbidities.  Wt Readings from Last 3 Encounters:  05/01/16 78.6 kg (173 lb 3.2 oz)  02/27/16 79 kg (174 lb 3.2 oz)  02/05/16 77.1 kg (170 lb)    PHYSICAL EXAM BP (!) 153/68   Pulse (!) 55   Ht 5\' 10"  (1.778 m)   Wt 78.6 kg (173 lb 3.2 oz)   BMI 24.85 kg/m  General appearance: alert, cooperative, appears stated age, no distress and well-nourished and well-groomed. Quite hard of hearing. HEENT: [place]/AT, EOMI, MMM, anicteric sclera; right pupil is irregular shaped from cataract surgery. Left is round and reactive to light Neck: no adenopathy, no carotid bruit and no JVD Lungs: clear to auscultation bilaterally, normal percussion bilaterally and non-labored Heart: Bradycardic rate and regular rhythm, S1 normal with split S2, no murmur, click, rub or gallop; non-displaced PMI & occasional Ectopy. Abdomen: soft, non-tender; bowel sounds normal; no masses,  no organomegaly; no HJR Extremities: extremities normal, atraumatic, no cyanosis, and edema - only noted L Knee & ankle Pulses: 2+ and symmetric; Skin: mobility and turgor normal, no evidence of bleeding or bruising and no lesions noted Neurologic: Mental status: Alert, oriented, thought content appropriate Cranial nerves: normal (II-XII grossly intact) - but hard of hearing. Slow & deliberate, but  steady gait.    Adult ECG Report - Not checked.   Other studies Reviewed: Additional studies/ records that were reviewed today include:  Recent Labs:     ASSESSMENT / PLAN: Problem List Items Addressed This Visit    Ischemic cardiomyopathy: EF 40-45%  now 30% - Primary (Chronic)    Notably reduced EF with history of ischemic cardiac disease but also left bundle branch block. It appears that the wall motion abnormality would be consistent with bundle branch block. Episode that concerned that he has had some nonsustained VT on monitor with a new reduction in EF.  I discussed with him as far as his ongoing symptoms and he denies any anginal or true heart failure symptoms. We talked about options as far as cardiac catheterization and the potential of referral for ICD. With him not having symptoms, he would prefer not to have a cardiac catheterization unless it would be necessary from an electrophysiology standpoint to determine the  best treatment for his nonsustained VT and reduced EF.  At this point, he would prefer to be referred to electrophysiology to discuss pros and cons of ICD. Depending on that discussion, he would be open for undergoing cardiac catheterization with possible PCI if warranted. Otherwise would prefer to treat medically.  Plan: Cannot use beta blocker due to bradycardia. Continue current dose of ACE inhibitor and allow for permissive hypertension based on his history of some orthostatic symptoms. Continue aspirin.      Relevant Medications   lisinopril (PRINIVIL,ZESTRIL) 10 MG tablet   Other Relevant Orders   Ambulatory referral to Cardiac Electrophysiology   Essential hypertension (Chronic)    Has had relatively well-controlled blood pressure with lisinopril.  Plan for now is to increase lisinopril to 10 mg, but would not titrate further as I would prefer to allow for some mild permissive hypertension.      Relevant Medications   lisinopril (PRINIVIL,ZESTRIL) 10  MG tablet   Other Relevant Orders   Ambulatory referral to Cardiac Electrophysiology   CAD S/P percutaneous coronary angioplasty (Chronic)    Still has no active anginal symptoms, and is very active. No heart failure symptoms either.  He is on aspirin and an ACE inhibitor. Not on statin due to intolerance and on beta blocker due to bradycardia.  Now with a reduced EF on echo, will need to consider the possibility of an ischemic evaluation, however with him not having symptoms, he would prefer to avoid this unless it is necessary to determine if he would be a candidate for either BiV pacing versus BiV-AICD      Relevant Medications   lisinopril (PRINIVIL,ZESTRIL) 10 MG tablet   Other Relevant Orders   Ambulatory referral to Cardiac Electrophysiology   Left bundle branch block (LBBB) on electrocardiogram (Chronic)    This is a chronic finding, and wall motion certainly noted on echocardiogram. Cannot be sure whether or not this is related to his reduced EF.   with reduced EF, nonsustained VT, may benefit from cardiac resynchronization plus minus ICD.      Relevant Orders   Ambulatory referral to Cardiac Electrophysiology   CLL (chronic lymphocytic leukemia) (HCC)   Relevant Medications   lisinopril (PRINIVIL,ZESTRIL) 10 MG tablet   Bradycardia with 41-50 beats per minute   Relevant Orders   Ambulatory referral to Cardiac Electrophysiology   NSVT (nonsustained ventricular tachycardia) ([place])    Concerning with the patient now has reduced EF. He also has a bundle branch block which I think may be rate related. Certainly his reduced EF can be related to bundle branch block versus ischemia. Plan referral to EP to evaluate for possible cardiac resynchronization therapy versus ICD.  Would determine plus or minus cardiac catheterization based on this evaluation.  Unfortunately, unable use beta blockers due to resting bradycardia.      Relevant Medications   lisinopril (PRINIVIL,ZESTRIL)  10 MG tablet   Other Relevant Orders   Ambulatory referral to Cardiac Electrophysiology      Greater than 45 minutes was spent with the patient and family discussing results of the study and potential options going forward. > 50% of the time was spent in consultation.  Current medicines are reviewed at length with the patient today. (+/- concerns) none The following changes have been made: none  Patient Instructions  Medication Instructions:  INCREASE lisinopril to 10mg  once daily.  Labwork: NONE  Testing/Procedures: NONE  Follow-Up: Follow up with Dr. Ellyn Hack in 2-3 months.  Any Other Special Instructions  Will Be Listed Below (If Applicable).  A referral has been sent to see an cardiac electrophysiologist at our [facility] [phone]).    If you need a refill on your cardiac medications before your next appointment, please call your pharmacy.   Studies Ordered:   Orders Placed This Encounter  Procedures  . Ambulatory referral to Cardiac Electrophysiology      Gregory Rodgers, M.D., M.S. Interventional Cardiologist   Pager # [phone] Phone # [phone] [address]. [place], [place] [address]

## 2016-05-03 ENCOUNTER — Institutional Professional Consult (permissible substitution): Payer: PPO | Admitting: Internal Medicine

## 2016-05-07 ENCOUNTER — Encounter: Payer: Self-pay | Admitting: Cardiology

## 2016-05-07 NOTE — Assessment & Plan Note (Signed)
Notably reduced EF with history of ischemic cardiac disease but also left bundle branch block. It appears that the wall motion abnormality would be consistent with bundle branch block. Episode that concerned that he has had some nonsustained VT on monitor with a new reduction in EF.  I discussed with him as far as his ongoing symptoms and he denies any anginal or true heart failure symptoms. We talked about options as far as cardiac catheterization and the potential of referral for ICD. With him not having symptoms, he would prefer not to have a cardiac catheterization unless it would be necessary from an electrophysiology standpoint to determine the best treatment for his nonsustained VT and reduced EF.  At this point, he would prefer to be referred to electrophysiology to discuss pros and cons of ICD. Depending on that discussion, he would be open for undergoing cardiac catheterization with possible PCI if warranted. Otherwise would prefer to treat medically.  Plan: Cannot use beta blocker due to bradycardia. Continue current dose of ACE inhibitor and allow for permissive hypertension based on his history of some orthostatic symptoms. Continue aspirin.

## 2016-05-07 NOTE — Assessment & Plan Note (Signed)
Concerning with the patient now has reduced EF. He also has a bundle branch block which I think may be rate related. Certainly his reduced EF can be related to bundle branch block versus ischemia. Plan referral to EP to evaluate for possible cardiac resynchronization therapy versus ICD.  Would determine plus or minus cardiac catheterization based on this evaluation.  Unfortunately, unable use beta blockers due to resting bradycardia.

## 2016-05-07 NOTE — Assessment & Plan Note (Signed)
This is a chronic finding, and wall motion certainly noted on echocardiogram. Cannot be sure whether or not this is related to his reduced EF.   with reduced EF, nonsustained VT, may benefit from cardiac resynchronization plus minus ICD.

## 2016-05-07 NOTE — Assessment & Plan Note (Addendum)
Has had relatively well-controlled blood pressure with lisinopril.  Plan for now is to increase lisinopril to 10 mg, but would not titrate further as I would prefer to allow for some mild permissive hypertension.

## 2016-05-07 NOTE — Assessment & Plan Note (Signed)
Still has no active anginal symptoms, and is very active. No heart failure symptoms either.  He is on aspirin and an ACE inhibitor. Not on statin due to intolerance and on beta blocker due to bradycardia.  Now with a reduced EF on echo, will need to consider the possibility of an ischemic evaluation, however with him not having symptoms, he would prefer to avoid this unless it is necessary to determine if he would be a candidate for either BiV pacing versus BiV-AICD

## 2016-05-08 ENCOUNTER — Encounter: Payer: Self-pay | Admitting: Internal Medicine

## 2016-05-08 ENCOUNTER — Ambulatory Visit (INDEPENDENT_AMBULATORY_CARE_PROVIDER_SITE_OTHER): Payer: PPO | Admitting: Internal Medicine

## 2016-05-08 VITALS — BP 130/66 | HR 48 | Ht 71.0 in | Wt 174.6 lb

## 2016-05-08 DIAGNOSIS — I255 Ischemic cardiomyopathy: Secondary | ICD-10-CM

## 2016-05-08 DIAGNOSIS — R42 Dizziness and giddiness: Secondary | ICD-10-CM

## 2016-05-08 DIAGNOSIS — R001 Bradycardia, unspecified: Secondary | ICD-10-CM | POA: Diagnosis not present

## 2016-05-08 NOTE — Progress Notes (Addendum)
HPI Gregory Rodgers is referred today by Dr. Ellyn Hack to consider ICD implantation. He is a pleasant 83 yo man with an ICM, who has had recent reduction in his LV function, sinus bradycardia and left bundle branch block. He has had a h/o dizziness but no documented significant bradycardia. The patient has had syncope remotely. He denies peripheral edema. He is very active. He can run a chain saw, rides a bike and is able to walk without limitation. His activity is corroborated by his wife and daughter. He wore a cardiac monitor and had NSVT. No long pauses however.   No Known Allergies   Current Outpatient Prescriptions  Medication Sig Dispense Refill  . aspirin 81 MG tablet Take 81 mg by mouth daily.    . Calcium Carbonate-Simethicone (MAALOX MAX PO) Take 5 mLs by mouth daily as needed (upset stomach).    . co-enzyme Q-10 30 MG capsule Take 30 mg by mouth daily.    . fish oil-omega-3 fatty acids 1000 MG capsule Take 1 g by mouth daily.     Marland Kitchen ibuprofen (ADVIL,MOTRIN) 200 MG tablet Take 200 mg by mouth every 6 (six) hours as needed for mild pain.    Marland Kitchen lisinopril (PRINIVIL,ZESTRIL) 10 MG tablet Take 1 tablet (10 mg total) by mouth daily. 90 tablet 3  . Multiple Vitamin (MULTIVITAMIN) tablet Take 1 tablet by mouth daily.     No current facility-administered medications for this visit.      Past Medical History:  Diagnosis Date  . Borderline diabetes   . CLL (chronic lymphocytic leukemia) ([place]) 09/2009   stage 0; on observation  . Coronary artery disease involving native heart without angina pectoris    PTCA of RCA in setting of an MI in 1986-87; no follow-up study since available.  . Essential hypertension   . Hyperlipidemia with target LDL less than 70    With known CAD  . Left bundle branch block (LBBB) on electrocardiogram    Noted as far back as 2008.  . 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   Past Surgical History:  Procedure Laterality Date  . REPLACEMENT TOTAL KNEE       No family history on file.   Social History   Social History  . Marital status: Married    Spouse name: N/A  . Number of children: N/A  . Years of education: N/A   Occupational History  . Not on file.   Social History Main Topics  . Smoking status: Never Smoker  . Smokeless tobacco: Never Used  . Alcohol use No  . Drug use: No  . Sexual activity: Not on file   Other Topics Concern  . Not on file   Social History Narrative  . No narrative on file     BP 130/66   Pulse (!) 48   Ht 5\' 11"  (1.803 m)   Wt 174 lb 9.6 oz (79.2 kg)   BMI 24.35 kg/m   Physical Exam:  Well appearing elderly man, NAD HEENT: Unremarkable Neck:  6 cm JVD, no thyromegally Back:  No CVA tenderness Lungs:  Clear with no wheezes HEART:  Regular rate rhythm, no murmurs, no rubs, no clicks Abd:  soft, positive bowel sounds, no organomegally, no rebound, no guarding Ext:  2 plus pulses, no edema, no cyanosis, no clubbing  Skin:  No rashes no nodules Neuro:  CN II through XII intact, motor grossly intact  EKG - sinus bradycardia with LBBB  Assess/Plan: 1. Sinus bradycardia and remote syncope - he has dizziness but it is unclear if this is due to sinus node dysfunction or heart block or unrelated to cardiac arrhythmias. I have recommended her have an ILR inserted. 2. Ischemic cardiomyopathy - the patient has severe LV dysfunction but no restriction in his activity. I have discussed the implications of this with regard to device implantation. I would recommend he have an ILR inserted. His advanced age makes him a less than ideal candidate for ICD insertion. 3. LBBB - despite severe LV dysfunction, I am reluctant to recommend he have an ICD or BiV PPM insertion unless we can clearly correlate symptoms with arrhythmias.  4. Dizzy spells - the etiology is unclear. Will place an  ILR.  Gregory Rodgers.D.

## 2016-05-08 NOTE — Patient Instructions (Addendum)
Medication Instructions:  Your physician recommends that you continue on your current medications as directed. Please refer to the Current Medication list given to you today.   Labwork: None Ordered   Testing/Procedures: LINQ   Follow-Up: Your physician wants you to follow-up in: wound check in the Device Clinic 7-10 days and 6 months with Dr. Lovena Le.  You will receive a reminder letter in the mail two months in advance. If you don't receive a letter, please call our office to schedule the follow-up appointment.    Any Other Special Instructions Will Be Listed Below ---   Report to the [facility] on 05/10/16 at 6:30 AM  You may eat and drink normally prior to procedure  You may drive yourself home after the procedure

## 2016-05-10 ENCOUNTER — Encounter (HOSPITAL_COMMUNITY): Payer: Self-pay | Admitting: Internal Medicine

## 2016-05-10 ENCOUNTER — Encounter (HOSPITAL_COMMUNITY)
Admission: RE | Disposition: A | Discharge status: Home / Self Care | Payer: Self-pay | Source: Ambulatory Visit | Attending: Internal Medicine

## 2016-05-10 ENCOUNTER — Ambulatory Visit (HOSPITAL_COMMUNITY)
Admission: RE | Admit: 2016-05-10 | Discharge: 2016-05-10 | Disposition: A | Discharge status: Home / Self Care | Payer: PPO | Source: Ambulatory Visit | Attending: Internal Medicine | Admitting: Internal Medicine

## 2016-05-10 DIAGNOSIS — I255 Ischemic cardiomyopathy: Secondary | ICD-10-CM | POA: Insufficient documentation

## 2016-05-10 DIAGNOSIS — I10 Essential (primary) hypertension: Secondary | ICD-10-CM | POA: Insufficient documentation

## 2016-05-10 DIAGNOSIS — Z955 Presence of coronary angioplasty implant and graft: Secondary | ICD-10-CM | POA: Insufficient documentation

## 2016-05-10 DIAGNOSIS — Z856 Personal history of leukemia: Secondary | ICD-10-CM | POA: Insufficient documentation

## 2016-05-10 DIAGNOSIS — I495 Sick sinus syndrome: Secondary | ICD-10-CM | POA: Diagnosis not present

## 2016-05-10 DIAGNOSIS — I2510 Atherosclerotic heart disease of native coronary artery without angina pectoris: Secondary | ICD-10-CM | POA: Insufficient documentation

## 2016-05-10 DIAGNOSIS — R7303 Prediabetes: Secondary | ICD-10-CM | POA: Insufficient documentation

## 2016-05-10 DIAGNOSIS — I447 Left bundle-branch block, unspecified: Secondary | ICD-10-CM | POA: Insufficient documentation

## 2016-05-10 DIAGNOSIS — I252 Old myocardial infarction: Secondary | ICD-10-CM | POA: Insufficient documentation

## 2016-05-10 DIAGNOSIS — E785 Hyperlipidemia, unspecified: Secondary | ICD-10-CM | POA: Diagnosis not present

## 2016-05-10 DIAGNOSIS — Z7982 Long term (current) use of aspirin: Secondary | ICD-10-CM | POA: Insufficient documentation

## 2016-05-10 DIAGNOSIS — R55 Syncope and collapse: Secondary | ICD-10-CM | POA: Diagnosis not present

## 2016-05-10 HISTORY — PX: EP IMPLANTABLE DEVICE: SHX172B

## 2016-05-10 SURGERY — LOOP RECORDER INSERTION
Site: Chest | Wound class: Clean

## 2016-05-10 MED ORDER — LIDOCAINE-EPINEPHRINE 1 %-1:100000 IJ SOLN
INTRAMUSCULAR | Status: DC | PRN
  Administered 2016-05-10: 20 mL

## 2016-05-10 MED ORDER — LIDOCAINE-EPINEPHRINE 1 %-1:100000 IJ SOLN
INTRAMUSCULAR | Status: AC
  Filled 2016-05-10: qty 1

## 2016-05-10 SURGICAL SUPPLY — 2 items
LOOP REVEAL LINQSYS (Prosthesis & Implant Heart) ×2 IMPLANT
PACK LOOP INSERTION (CUSTOM PROCEDURE TRAY) ×2 IMPLANT

## 2016-05-10 NOTE — H&P (View-Only) (Signed)
HPI Gregory Rodgers is referred today by Dr. Ellyn Hack to consider ICD implantation. He is a pleasant 83 yo man with an ICM, who has had recent reduction in his LV function, sinus bradycardia and left bundle branch block. He has had a h/o dizziness but no documented significant bradycardia. The patient has never had syncope. He denies peripheral edema. He is very active. He can run a chain saw, rides a bike and is able to walk without limitation. His activity is corroborated by his wife and daughter. He wore a cardiac monitor and had NSVT. No long pauses however.   No Known Allergies   Current Outpatient Prescriptions  Medication Sig Dispense Refill  . aspirin 81 MG tablet Take 81 mg by mouth daily.    . Calcium Carbonate-Simethicone (MAALOX MAX PO) Take 5 mLs by mouth daily as needed (upset stomach).    . co-enzyme Q-10 30 MG capsule Take 30 mg by mouth daily.    . fish oil-omega-3 fatty acids 1000 MG capsule Take 1 g by mouth daily.     Marland Kitchen ibuprofen (ADVIL,MOTRIN) 200 MG tablet Take 200 mg by mouth every 6 (six) hours as needed for mild pain.    Marland Kitchen lisinopril (PRINIVIL,ZESTRIL) 10 MG tablet Take 1 tablet (10 mg total) by mouth daily. 90 tablet 3  . Multiple Vitamin (MULTIVITAMIN) tablet Take 1 tablet by mouth daily.     No current facility-administered medications for this visit.      Past Medical History:  Diagnosis Date  . Borderline diabetes   . CLL (chronic lymphocytic leukemia) ([place]) 09/2009   stage 0; on observation  . Coronary artery disease involving native heart without angina pectoris    PTCA of RCA in setting of an MI in 1986-87; no follow-up study since available.  . Essential hypertension   . Hyperlipidemia with target LDL less than 70    With known CAD  . Left bundle branch block (LBBB) on electrocardiogram    Noted as far back as 2008.  . 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   Past Surgical History:  Procedure Laterality Date  . REPLACEMENT TOTAL KNEE       No family history on file.   Social History   Social History  . Marital status: Married    Spouse name: N/A  . Number of children: N/A  . Years of education: N/A   Occupational History  . Not on file.   Social History Main Topics  . Smoking status: Never Smoker  . Smokeless tobacco: Never Used  . Alcohol use No  . Drug use: No  . Sexual activity: Not on file   Other Topics Concern  . Not on file   Social History Narrative  . No narrative on file     BP 130/66   Pulse (!) 48   Ht 5\' 11"  (1.803 m)   Wt 174 lb 9.6 oz (79.2 kg)   BMI 24.35 kg/m   Physical Exam:  Well appearing elderly man, NAD HEENT: Unremarkable Neck:  6 cm JVD, no thyromegally Back:  No CVA tenderness Lungs:  Clear with no wheezes HEART:  Regular rate rhythm, no murmurs, no rubs, no clicks Abd:  soft, positive bowel sounds, no organomegally, no rebound, no guarding Ext:  2 plus pulses, no edema, no cyanosis, no clubbing  Skin:  No rashes no nodules Neuro:  CN II through XII intact, motor grossly intact  EKG - sinus bradycardia with LBBB  Assess/Plan: 1. Sinus bradycardia - he has dizziness but it is unclear if this is due to sinus node dysfunction or heart block or unrelated to cardiac arrhythmias. I have recommended her have an ILR inserted. 2. Ischemic cardiomyopathy - the patient has severe LV dysfunction but no restriction in his activity. I have discussed the implications of this with regard to device implantation. I would recommend he have an ILR inserted. His advanced age makes him a less than ideal candidate for ICD insertion. 3. LBBB - despite severe LV dysfunction, I am reluctant to recommend he have an ICD or BiV PPM insertion.  4. Dizzy spells - the etiology is unclear. Will place an ILR.  Mikle Bosworth.D.

## 2016-05-10 NOTE — Interval H&P Note (Signed)
History and Physical Interval Note:  05/10/2016 9:25 AM  Gregory Rodgers  has presented today for surgery, with the diagnosis of cm - dizziness  The various methods of treatment have been discussed with the patient and family. After consideration of risks, benefits and other options for treatment, the patient has consented to  Procedure(s) with comments: Loop Recorder Insertion (N/A) - slight blood drop on dressing as a surgical intervention .  The patient's history has been reviewed, patient examined, no change in status, stable for surgery.  I have reviewed the patient's chart and labs.  Questions were answered to the patient's satisfaction.     [name]

## 2016-05-10 NOTE — Discharge Instructions (Signed)
See loose extra sheet

## 2016-05-23 ENCOUNTER — Ambulatory Visit (INDEPENDENT_AMBULATORY_CARE_PROVIDER_SITE_OTHER): Payer: PPO | Admitting: *Deleted

## 2016-05-23 DIAGNOSIS — Z95818 Presence of other cardiac implants and grafts: Secondary | ICD-10-CM

## 2016-05-23 DIAGNOSIS — R42 Dizziness and giddiness: Secondary | ICD-10-CM

## 2016-05-23 LAB — CUP PACEART INCLINIC DEVICE CHECK
Date Time Interrogation Session: 20180207141049
MDC IDC PG IMPLANT DT: 20180125

## 2016-05-23 NOTE — Progress Notes (Signed)
Wound check appointment. Steri-strips removed. Wound without redness or edema. Incision edges approximated, wound well healed. Battery status: good. R-waves 0.68mV. 1 symptom episode--false, patient used symptom activator during today's appointment as part of wound check education. No tachy, pause, brady, or AF episodes. Patient educated about Carelink monitor and wound care. Patient and family aware to call the [facility] if patient has a symptom episode. Monthly summary reports and ROV with GT on 08/14/16.

## 2016-06-05 DIAGNOSIS — L570 Actinic keratosis: Secondary | ICD-10-CM | POA: Diagnosis not present

## 2016-06-11 ENCOUNTER — Ambulatory Visit (INDEPENDENT_AMBULATORY_CARE_PROVIDER_SITE_OTHER): Payer: PPO | Admitting: *Deleted

## 2016-06-11 DIAGNOSIS — R001 Bradycardia, unspecified: Secondary | ICD-10-CM

## 2016-06-11 DIAGNOSIS — I255 Ischemic cardiomyopathy: Secondary | ICD-10-CM | POA: Diagnosis not present

## 2016-06-11 NOTE — Progress Notes (Signed)
Carelink Summary Report / Loop Recorder 

## 2016-06-27 LAB — CUP PACEART REMOTE DEVICE CHECK
Implantable Pulse Generator Implant Date: 20180125
MDC IDC SESS DTM: 20180224170530

## 2016-07-04 ENCOUNTER — Ambulatory Visit (INDEPENDENT_AMBULATORY_CARE_PROVIDER_SITE_OTHER): Payer: PPO | Admitting: Cardiology

## 2016-07-04 ENCOUNTER — Encounter: Payer: Self-pay | Admitting: Cardiology

## 2016-07-04 VITALS — BP 140/74 | HR 56 | Ht 71.0 in | Wt 175.0 lb

## 2016-07-04 DIAGNOSIS — I472 Ventricular tachycardia: Secondary | ICD-10-CM

## 2016-07-04 DIAGNOSIS — I255 Ischemic cardiomyopathy: Secondary | ICD-10-CM | POA: Diagnosis not present

## 2016-07-04 DIAGNOSIS — E7849 Other hyperlipidemia: Secondary | ICD-10-CM

## 2016-07-04 DIAGNOSIS — E784 Other hyperlipidemia: Secondary | ICD-10-CM

## 2016-07-04 DIAGNOSIS — Z9861 Coronary angioplasty status: Secondary | ICD-10-CM

## 2016-07-04 DIAGNOSIS — I2510 Atherosclerotic heart disease of native coronary artery without angina pectoris: Secondary | ICD-10-CM

## 2016-07-04 DIAGNOSIS — R001 Bradycardia, unspecified: Secondary | ICD-10-CM | POA: Diagnosis not present

## 2016-07-04 DIAGNOSIS — I4729 Other ventricular tachycardia: Secondary | ICD-10-CM

## 2016-07-04 DIAGNOSIS — I10 Essential (primary) hypertension: Secondary | ICD-10-CM

## 2016-07-04 NOTE — Assessment & Plan Note (Signed)
Relatively well-controlled on his current dose of lisinopril. Potentially increase the dose, but he has had some occasions of orthostatic hypotension type dizziness. May need to gradually titrate up once he becomes used to this dose.

## 2016-07-04 NOTE — Assessment & Plan Note (Signed)
Not on statin -- taking omega-3 fatty acids and CoQ10. He did not seem excited about the option of taking a statin during his initial evaluation. With known CAD, his LDL goal should be less than 70 if not more, his labs have been monitored by his PCP and I do not have the most recent evaluation.

## 2016-07-04 NOTE — Progress Notes (Signed)
PCP: Charletta Cousin, MD (Inactive)  Clinic Note: Chief Complaint  Patient presents with  . Follow-up    2-3 MONTHS  . Near Syncope    s/p Loop Recorder  . Coronary Artery Disease  . Cardiomyopathy    HPI: Gregory Rodgers is a 83 y.o. male with a PMH below who presents today for follow-up of his cardiomyopathy. Initial consultation was in November 2017 after an ER visit following a prolonged spell of dizziness.. He has a known history of distant coronary disease and had a new evaluation showing severe reduction in his LVEF. He has sinus bradycardia and LBBB at baseline.  [name] was last seen by me on 05/01/2016. He had not noted any further dizzy spells of fatigue. We did discuss the results of his echocardiogram with a 9 run of of nonsustained VT. I referred him to Dr. Lovena Le from electrophysiology. He indicated that he would prefer to avoid cardiac catheterization, symptoms necessary from EP standpoint.-- Dr. Lovena Le recommended insertion of the loop recorder.  Recent Hospitalizations: for ILR insertion  Studies Reviewed:  Loop recorder insertion January 25  Interval History: Gregory Rodgers returns today actually doing fairly well. He has not had any further episodes of his "dizzy spells. The only thing that he may note is any does get a little bit more tired easily than he had before. Walking up a hill or up stairs will definitely take a lot out of him. However he doesn't really notice significant issues with walking on flat ground. Moderate indicates that he is out doing yard work, cutting tree limbs etc. without too much problems. Denies any anginal symptoms with rest or exertion. No real PND, orthopnea or edema. The little bit of mild swelling at the end of the day. On his feet. He has not noticed any palpitations, rapid irregular heartbeats. No TIA or amaurosis fugax symptoms and no near syncope or near-syncope Symptoms.  -1 night he had some mild PND type symptoms, but that's  it. -He is not had any issues at all with loop recorder.  No melena, hematochezia, hematuria, or epstaxis. No claudication.  ROS: A comprehensive was performed. Review of Systems  Constitutional: Negative for malaise/fatigue.       Mildly reduced exercise tolerance  Respiratory: Negative.   Cardiovascular:       Per history of present illness  Musculoskeletal: Positive for joint pain. Negative for falls.  Neurological: Negative for tremors and focal weakness.  Psychiatric/Behavioral: Positive for memory loss.       No real signs of impending dementia  All other systems reviewed and are negative.   Constitutional: Positive for malaise/fatigue (No further spells).  HENT: Positive for hearing loss. Negative for congestion and nosebleeds.   Respiratory: Negative for cough, shortness of breath and wheezing.   Cardiovascular: Positive for leg swelling (Only left leg noted in history of present illness).  Gastrointestinal: Negative for blood in stool, constipation and melena.  Genitourinary: Negative for hematuria.  Musculoskeletal: Positive for joint pain (Intermittent left knee pain and swelling from prior surgery).  Neurological: Negative for seizures and loss of consciousness. Dizziness: Per history of present illness.  Psychiatric/Behavioral: Negative for depression and memory loss. The patient is not nervous/anxious and does not have insomnia.   Past Medical History:  Diagnosis Date  . Borderline diabetes   . Cardiomyopathy, dilated ([place]) 03/2016   Unclear etiology  . CLL (chronic lymphocytic leukemia) ([place]) 09/2009   stage 0; on observation  . Coronary artery disease involving  native heart without angina pectoris    PTCA of RCA in setting of an MI in 1986-87; no follow-up study since available.  . Essential hypertension   . Hyperlipidemia with target LDL less than 70    With known CAD  . Left bundle branch block (LBBB) on electrocardiogram    Noted as far back as 2008.  .  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 EP IMPLANTABLE DEVICE N/A 05/10/2016   Procedure: Loop Recorder Insertion;  Surgeon: Evans Lance, MD;  Location: [facility];  Service: Cardiovascular;  Laterality: N/A;  slight blood drop on dressing  . REPLACEMENT TOTAL KNEE    . TRANSTHORACIC ECHOCARDIOGRAM  03/2016   Normal LV size with mild LV hypertrophy. EF 30%. Diffuse  hypokinesis wtih septal-lateral dyssynchrony. Normal RV size and systolic function. Mild MR and mild AI.    Current Meds  Medication Sig  . aspirin 81 MG tablet Take 81 mg by mouth daily.  . Calcium Carbonate-Simethicone (MAALOX MAX PO) Take 5 mLs by mouth daily as needed (upset stomach).  . co-enzyme Q-10 30 MG capsule Take 30 mg by mouth daily.  . fish oil-omega-3 fatty acids 1000 MG capsule Take 1 g by mouth daily.   Marland Kitchen ibuprofen (ADVIL,MOTRIN) 200 MG tablet Take 200 mg by mouth every 6 (six) hours as needed for mild pain.  Marland Kitchen lisinopril (PRINIVIL,ZESTRIL) 10 MG tablet Take 1 tablet (10 mg total) by mouth daily.  . Multiple Vitamin (MULTIVITAMIN) tablet Take 1 tablet by mouth daily.  . Probiotic Product (PROBIOTIC ADVANCED PO) Take by mouth. Unknown strength takes 3xweek    No Known Allergies  Social History   Social History  . Marital status: Married    Spouse name: N/A  . Number of children: N/A  . Years of education: N/A   Social History Main Topics  . Smoking status: Never Smoker  . Smokeless tobacco: Never Used  . Alcohol use No  . Drug use: No  . Sexual activity: Not Asked   Other Topics Concern  . None   Social History Narrative  . None    family history is not on file.  Wt Readings from Last 3 Encounters:  07/04/16 79.4 kg (175 lb)  05/10/16 77.1 kg (170 lb)  05/08/16 79.2 kg (174 lb 9.6 oz)    PHYSICAL EXAM BP 140/74   Pulse (!) 56   Ht 5\' 11"  (1.803 m)   Wt 79.4 kg (175 lb)    BMI 24.41 kg/m  General appearance: alert, cooperative, appears stated age, no distress and well-nourished and well-groomed. Quite hard of hearing. HEENT: [place]/AT, EOMI, MMM, anicteric sclera; right pupil is irregular shaped from cataract surgery. Left is round and reactive to light Neck: no adenopathy, no carotid bruit and no JVD Lungs: clear to auscultation bilaterally, normal percussion bilaterally and non-labored Heart: Bradycardic rate and regular rhythm, S1 normal with split S2, no murmur, click, rub or gallop; non-displaced PMI & occasional Ectopy. Abdomen: soft, non-tender; bowel sounds normal; no masses,  no organomegaly; no HJR Extremities: extremities normal, atraumatic, no cyanosis, and edema - only noted L Knee & ankle Pulses: 2+ and symmetric; Skin: mobility and turgor normal, no evidence of bleeding or bruising and no lesions noted Neurologic: Mental status: Alert, oriented, thought content appropriate; very hard of hearing.   Adult ECG Report Not checked  Other studies Reviewed:  Additional studies/ records that were reviewed today include:  Recent Labs:   Lab Results  Component Value Date   CREATININE 0.95 02/05/2016   BUN 15 02/05/2016   NA 139 02/05/2016   K 4.2 02/05/2016   CL 109 02/05/2016   CO2 26 02/05/2016   No results found for: CHOL, HDL, LDLCALC, LDLDIRECT, TRIG, CHOLHDL   ASSESSMENT / PLAN: Problem List Items Addressed This Visit    Bradycardia with 41-50 beats per minute    Being followed now by loop recorder. Disorder tell his dizziness spells are related to bradycardia or wide-complex tachycardia.      CAD S/P percutaneous coronary angioplasty (Chronic)    Has not had any active anginal symptoms, doesn't appear that he has true significant regional wall motion abnormality besides what would be considered related to left bundle branch block on his echocardiogram. He is on aspirin and ACE inhibitor alone. Not on beta blocker because of bradycardia,  and not on statin because of previous intolerance. As per discussion noted elsewhere, plan for now would be to forego ischemic evaluation unless he were to have exacerbation of symptoms, or true evidence of potentially ischemic VT on his loop recorder.      Essential hypertension (Chronic)    Relatively well-controlled on his current dose of lisinopril. Potentially increase the dose, but he has had some occasions of orthostatic hypotension type dizziness. May need to gradually titrate up once he becomes used to this dose.      Hyperlipidemia (Chronic)    Not on statin -- taking omega-3 fatty acids and CoQ10. He did not seem excited about the option of taking a statin during his initial evaluation. With known CAD, his LDL goal should be less than 70 if not more, his labs have been monitored by his PCP and I do not have the most recent evaluation.      Ischemic cardiomyopathy: EF 40-45%  now 30% - Primary (Chronic)    Presumed ischemic cardiomyopathy, simply based on his history. With a left bundle branch block and reduced EF, I simply think that a stress test would not really be helpful as it will most likely be read as abnormal. We therefore again discussed his symptoms/lack thereof and thoughts of potential invasive evaluation. The consensus from a discussion between his wife and daughter along with the patient, was that he would prefer not to do invasive procedures. Certainly if he were to have worsening symptoms of exertional dyspnea or chest tightness, that would be the recommendation. Plan for now will be to continue with afterload reduction using the increased dose of ACE inhibitor. In the absence of true heart failure symptoms, I would be reluctant to push the dose too high for fear of orthostatic hypotension. No beta blocker because of resting bradycardia, and he sees relatively euvolemic, therefore no requirement for a diuretic at this point.  - He has loop recorder in place to evaluate  for potential white, was tachycardia/SVT. Has been evaluated by Dr. Lovena Le for BP. At present he did not seem to be an "ideal" candidate for ICD      NSVT (nonsustained ventricular tachycardia) ([place])    Referred for repeat evaluation. Has loop recorder inserted. A single bradycardia, no beta blocker at this point.         We had quite a long discussion about prior conversations and then going forward as far as what further evaluation will be performed. Close to 45 minutes was spent in direct contact with the  patient. > 50% of the time dedicated to counseling & discussion of plan of care.  Current medicines are reviewed at length with the patient today. (+/- concerns) N/A The following changes have been made: -->  Patient Instructions  Medication Instructions:  Your physician recommends that you continue on your current medications as directed. Please refer to the Current Medication list given to you today.  Labwork: None   Testing/Procedures: None   Follow-Up: Your physician wants you to follow-up in: 6 months with Dr Ellyn Hack.  You will receive a reminder letter in the mail two months in advance. If you don't receive a letter, please call our office to schedule the follow-up appointment.  Any Other Special Instructions Will Be Listed Below (If Applicable).  Call the office if you need [place]    If you need a refill on your cardiac medications before your next appointment, please call your pharmacy.    Studies Ordered:   No orders of the defined types were placed in this encounter.     Glenetta Hew, M.D., M.S. Interventional Cardiologist   Pager # [phone] Phone # [phone] [address] [place], [place] [address]

## 2016-07-04 NOTE — Assessment & Plan Note (Addendum)
Has not had any active anginal symptoms, doesn't appear that he has true significant regional wall motion abnormality besides what would be considered related to left bundle branch block on his echocardiogram. He is on aspirin and ACE inhibitor alone. Not on beta blocker because of bradycardia, and not on statin because of previous intolerance. As per discussion noted elsewhere, plan for now would be to forego ischemic evaluation unless he were to have exacerbation of symptoms, or true evidence of potentially ischemic VT on his loop recorder.

## 2016-07-04 NOTE — Assessment & Plan Note (Signed)
Being followed now by loop recorder. Disorder tell his dizziness spells are related to bradycardia or wide-complex tachycardia.

## 2016-07-04 NOTE — Assessment & Plan Note (Signed)
Presumed ischemic cardiomyopathy, simply based on his history. With a left bundle branch block and reduced EF, I simply think that a stress test would not really be helpful as it will most likely be read as abnormal. We therefore again discussed his symptoms/lack thereof and thoughts of potential invasive evaluation. The consensus from a discussion between his wife and daughter along with the patient, was that he would prefer not to do invasive procedures. Certainly if he were to have worsening symptoms of exertional dyspnea or chest tightness, that would be the recommendation. Plan for now will be to continue with afterload reduction using the increased dose of ACE inhibitor. In the absence of true heart failure symptoms, I would be reluctant to push the dose too high for fear of orthostatic hypotension. No beta blocker because of resting bradycardia, and he sees relatively euvolemic, therefore no requirement for a diuretic at this point.  - He has loop recorder in place to evaluate for potential white, was tachycardia/SVT. Has been evaluated by Dr. Lovena Le for BP. At present he did not seem to be an "ideal" candidate for ICD

## 2016-07-04 NOTE — Assessment & Plan Note (Signed)
Referred for repeat evaluation. Has loop recorder inserted. A single bradycardia, no beta blocker at this point.

## 2016-07-04 NOTE — Patient Instructions (Signed)
Medication Instructions:  Your physician recommends that you continue on your current medications as directed. Please refer to the Current Medication list given to you today.  Labwork: None   Testing/Procedures: None   Follow-Up: Your physician wants you to follow-up in: 6 months with Dr Ellyn Hack.  You will receive a reminder letter in the mail two months in advance. If you don't receive a letter, please call our office to schedule the follow-up appointment.  Any Other Special Instructions Will Be Listed Below (If Applicable).  Call the office if you need [place]    If you need a refill on your cardiac medications before your next appointment, please call your pharmacy.

## 2016-07-09 ENCOUNTER — Ambulatory Visit (INDEPENDENT_AMBULATORY_CARE_PROVIDER_SITE_OTHER): Payer: PPO | Admitting: *Deleted

## 2016-07-09 DIAGNOSIS — R55 Syncope and collapse: Secondary | ICD-10-CM

## 2016-07-10 NOTE — Progress Notes (Signed)
Carelink Summary Report / Loop Recorder 

## 2016-07-16 DIAGNOSIS — H538 Other visual disturbances: Secondary | ICD-10-CM | POA: Diagnosis not present

## 2016-07-16 DIAGNOSIS — H539 Unspecified visual disturbance: Secondary | ICD-10-CM | POA: Diagnosis not present

## 2016-07-16 DIAGNOSIS — H5462 Unqualified visual loss, left eye, normal vision right eye: Secondary | ICD-10-CM | POA: Diagnosis not present

## 2016-07-21 LAB — CUP PACEART REMOTE DEVICE CHECK
MDC IDC PG IMPLANT DT: 20180125
MDC IDC SESS DTM: 20180326173954

## 2016-07-23 DIAGNOSIS — H5462 Unqualified visual loss, left eye, normal vision right eye: Secondary | ICD-10-CM | POA: Diagnosis not present

## 2016-08-08 ENCOUNTER — Ambulatory Visit (INDEPENDENT_AMBULATORY_CARE_PROVIDER_SITE_OTHER): Payer: PPO | Admitting: *Deleted

## 2016-08-08 DIAGNOSIS — R55 Syncope and collapse: Secondary | ICD-10-CM

## 2016-08-09 NOTE — Progress Notes (Signed)
Carelink Summary Report / Loop Recorder 

## 2016-08-14 ENCOUNTER — Ambulatory Visit (INDEPENDENT_AMBULATORY_CARE_PROVIDER_SITE_OTHER): Payer: PPO | Admitting: Internal Medicine

## 2016-08-14 ENCOUNTER — Encounter: Payer: Self-pay | Admitting: Internal Medicine

## 2016-08-14 VITALS — BP 136/68 | HR 56 | Ht 71.0 in | Wt 172.4 lb

## 2016-08-14 DIAGNOSIS — I10 Essential (primary) hypertension: Secondary | ICD-10-CM | POA: Diagnosis not present

## 2016-08-14 DIAGNOSIS — I255 Ischemic cardiomyopathy: Secondary | ICD-10-CM

## 2016-08-14 DIAGNOSIS — R55 Syncope and collapse: Secondary | ICD-10-CM | POA: Diagnosis not present

## 2016-08-14 NOTE — Patient Instructions (Signed)
Medication Instructions:  Your physician recommends that you continue on your current medications as directed. Please refer to the Current Medication list given to you today.   Labwork: none  Testing/Procedures: none  Follow-Up: Your physician wants you to follow-up in: 6 months with Dr. Taylor. You will receive a reminder letter in the mail two months in advance. If you don't receive a letter, please call our office to schedule the follow-up appointment.   Any Other Special Instructions Will Be Listed Below (If Applicable).     If you need a refill on your cardiac medications before your next appointment, please call your pharmacy.   

## 2016-08-14 NOTE — Progress Notes (Signed)
HPI Gregory Rodgers returns today for ongoing followup. He is a pleasant 83 yo man with an ICM, who has had recent reduction in his LV function, sinus bradycardia and left bundle branch block. He has had a h/o dizziness but no documented significant bradycardia. The patient has had syncope remotely. He has peripheral edema in the left leg. He is very active. He can run a chain saw, rides a bike and is able to walk without limitation. His activity is corroborated by his wife and daughter. He wore a cardiac monitor and had NSVT. No long pauses however. I placed an ILR several months ago. He notes that his energy level is down a bit but he remains active. No syncope.  No Known Allergies   Current Outpatient Prescriptions  Medication Sig Dispense Refill  . aspirin 81 MG tablet Take 81 mg by mouth daily.    . Calcium Carbonate-Simethicone (MAALOX MAX PO) Take 5 mLs by mouth daily as needed (upset stomach).    . co-enzyme Q-10 30 MG capsule Take 30 mg by mouth daily.    . fish oil-omega-3 fatty acids 1000 MG capsule Take 1 g by mouth daily.     Marland Kitchen ibuprofen (ADVIL,MOTRIN) 200 MG tablet Take 200 mg by mouth every 6 (six) hours as needed for mild pain.    Marland Kitchen lisinopril (PRINIVIL,ZESTRIL) 10 MG tablet Take 1 tablet (10 mg total) by mouth daily. 90 tablet 3  . Multiple Vitamin (MULTIVITAMIN) tablet Take 1 tablet by mouth daily.    . Probiotic Product (PROBIOTIC ADVANCED PO) Take by mouth. Unknown strength takes 3xweek     No current facility-administered medications for this visit.      Past Medical History:  Diagnosis Date  . Borderline diabetes   . Cardiomyopathy, dilated ([place]) 03/2016   Unclear etiology  . CLL (chronic lymphocytic leukemia) ([place]) 09/2009   stage 0; on observation  . Coronary artery disease involving native heart without angina pectoris    PTCA of RCA in setting of an MI in 1986-87; no follow-up study since available.  . Essential hypertension   . Hyperlipidemia with target  LDL less than 70    With known CAD  . Left bundle branch block (LBBB) on electrocardiogram    Noted as far back as 2008.  . 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   Past Surgical History:  Procedure Laterality Date  . EP IMPLANTABLE DEVICE N/A 05/10/2016   Procedure: Loop Recorder Insertion;  Surgeon: Evans Lance, MD;  Location: [facility];  Service: Cardiovascular;  Laterality: N/A;  slight blood drop on dressing  . REPLACEMENT TOTAL KNEE    . TRANSTHORACIC ECHOCARDIOGRAM  03/2016   Normal LV size with mild LV hypertrophy. EF 30%. Diffuse  hypokinesis wtih septal-lateral dyssynchrony. Normal RV size and systolic function. Mild MR and mild AI.     No family history on file.   Social History   Social History  . Marital status: Married    Spouse name: N/A  . Number of children: N/A  . Years of education: N/A   Occupational History  . Not on file.   Social History Main Topics  . Smoking status: Never Smoker  . Smokeless tobacco: Never Used  . Alcohol use No  . Drug use: No  . Sexual activity: Not on  file   Other Topics Concern  . Not on file   Social History Narrative  . No narrative on file     BP 136/68 (BP Location: Left Arm)   Pulse (!) 56   Ht 5\' 11"  (1.803 m)   Wt 172 lb 6.4 oz (78.2 kg)   BMI 24.04 kg/m   Physical Exam:  Well appearing elderly man, NAD HEENT: Unremarkable Neck:  6 cm JVD, no thyromegally Back:  No CVA tenderness Lungs:  Clear with no wheezes HEART:  Regular rate rhythm, no murmurs, no rubs, no clicks Abd:  soft, positive bowel sounds, no organomegally, no rebound, no guarding Ext:  2 plus pulses, no edema, no cyanosis, no clubbing Skin:  No rashes no nodules Neuro:  CN II through XII intact, motor grossly intact    Assess/Plan: 1. Sinus bradycardia and remote syncope - he has dizziness but it is  unclear if this is due to sinus node dysfunction or heart block or unrelated to cardiac arrhythmias. So far no brady arrhythmias on his heart monitor. 2. Ischemic cardiomyopathy - he has no symptoms. He will continue his current meds and undergo watchful waiting. 3. LBBB - despite severe LV dysfunction, for which he is asymptomatic. We will follow.  4. Dizzy spells - he has had one episode getting into bed which showed no brady or tachy episodes on his heart monitor.  Mikle Bosworth.D.

## 2016-08-21 LAB — CUP PACEART INCLINIC DEVICE CHECK
Implantable Pulse Generator Implant Date: 20180125
MDC IDC SESS DTM: 20180501132328

## 2016-08-29 DIAGNOSIS — R7303 Prediabetes: Secondary | ICD-10-CM | POA: Diagnosis not present

## 2016-08-29 DIAGNOSIS — E559 Vitamin D deficiency, unspecified: Secondary | ICD-10-CM | POA: Diagnosis not present

## 2016-08-29 DIAGNOSIS — N182 Chronic kidney disease, stage 2 (mild): Secondary | ICD-10-CM | POA: Diagnosis not present

## 2016-08-29 DIAGNOSIS — Z87898 Personal history of other specified conditions: Secondary | ICD-10-CM | POA: Diagnosis not present

## 2016-08-29 DIAGNOSIS — I1310 Hypertensive heart and chronic kidney disease without heart failure, with stage 1 through stage 4 chronic kidney disease, or unspecified chronic kidney disease: Secondary | ICD-10-CM | POA: Diagnosis not present

## 2016-08-29 DIAGNOSIS — E785 Hyperlipidemia, unspecified: Secondary | ICD-10-CM | POA: Diagnosis not present

## 2016-08-29 LAB — CUP PACEART REMOTE DEVICE CHECK
Date Time Interrogation Session: 20180425180618
Implantable Pulse Generator Implant Date: 20180125

## 2016-09-07 ENCOUNTER — Ambulatory Visit (INDEPENDENT_AMBULATORY_CARE_PROVIDER_SITE_OTHER): Payer: PPO | Admitting: *Deleted

## 2016-09-07 DIAGNOSIS — R55 Syncope and collapse: Secondary | ICD-10-CM | POA: Diagnosis not present

## 2016-09-07 NOTE — Progress Notes (Signed)
Carelink Summary Report / Loop Recorder 

## 2016-09-11 LAB — CUP PACEART REMOTE DEVICE CHECK
MDC IDC PG IMPLANT DT: 20180125
MDC IDC SESS DTM: 20180525180855

## 2016-09-28 ENCOUNTER — Ambulatory Visit: Payer: Medicare Other | Admitting: Hematology and Oncology

## 2016-09-28 ENCOUNTER — Other Ambulatory Visit: Payer: Medicare Other

## 2016-10-08 ENCOUNTER — Telehealth: Payer: Self-pay | Admitting: Hematology and Oncology

## 2016-10-08 ENCOUNTER — Ambulatory Visit (HOSPITAL_BASED_OUTPATIENT_CLINIC_OR_DEPARTMENT_OTHER): Payer: PPO | Admitting: Hematology and Oncology

## 2016-10-08 ENCOUNTER — Ambulatory Visit (INDEPENDENT_AMBULATORY_CARE_PROVIDER_SITE_OTHER): Payer: PPO | Admitting: *Deleted

## 2016-10-08 ENCOUNTER — Encounter: Payer: Self-pay | Admitting: Hematology and Oncology

## 2016-10-08 ENCOUNTER — Other Ambulatory Visit (HOSPITAL_BASED_OUTPATIENT_CLINIC_OR_DEPARTMENT_OTHER): Payer: PPO

## 2016-10-08 DIAGNOSIS — C9110 Chronic lymphocytic leukemia of B-cell type not having achieved remission: Secondary | ICD-10-CM

## 2016-10-08 DIAGNOSIS — R55 Syncope and collapse: Secondary | ICD-10-CM | POA: Diagnosis not present

## 2016-10-08 DIAGNOSIS — D61818 Other pancytopenia: Secondary | ICD-10-CM | POA: Diagnosis not present

## 2016-10-08 LAB — CBC & DIFF AND RETIC
BASO%: 0.1 % (ref 0.0–2.0)
BASOS ABS: 0.1 10*3/uL (ref 0.0–0.1)
EOS ABS: 0.2 10*3/uL (ref 0.0–0.5)
EOS%: 0.6 % (ref 0.0–7.0)
HCT: 34.9 % — ABNORMAL LOW (ref 38.4–49.9)
HEMOGLOBIN: 11.1 g/dL — AB (ref 13.0–17.1)
IMMATURE RETIC FRACT: 6.5 % (ref 3.00–10.60)
LYMPH%: 91.9 % — AB (ref 14.0–49.0)
MCH: 33.2 pg (ref 27.2–33.4)
MCHC: 31.8 g/dL — ABNORMAL LOW (ref 32.0–36.0)
MCV: 104.5 fL — AB (ref 79.3–98.0)
MONO#: 0.5 10*3/uL (ref 0.1–0.9)
MONO%: 1.5 % (ref 0.0–14.0)
NEUT%: 5.9 % — ABNORMAL LOW (ref 39.0–75.0)
NEUTROS ABS: 2 10*3/uL (ref 1.5–6.5)
NRBC: 0 % (ref 0–0)
PLATELETS: 145 10*3/uL (ref 140–400)
RBC: 3.34 10*6/uL — ABNORMAL LOW (ref 4.20–5.82)
RDW: 13.5 % (ref 11.0–14.6)
Retic %: 1.34 % (ref 0.80–1.80)
Retic Ct Abs: 44.76 10*3/uL (ref 34.80–93.90)
WBC: 33.6 10*3/uL — AB (ref 4.0–10.3)
lymph#: 30.9 10*3/uL — ABNORMAL HIGH (ref 0.9–3.3)

## 2016-10-08 NOTE — Progress Notes (Signed)
Carelink Summary Report / Loop Recorder 

## 2016-10-08 NOTE — Assessment & Plan Note (Signed)
The cause is likely related to CLL. It is mild and there is little change compared from previous hemoglobin and platelet count. The patient denies recent history of bleeding such as epistaxis, hematuria or hematochezia. He is asymptomatic from the thrombocytopenia and anemia. I will observe for now.  There is no contraindication to remain on aspirin as well as blood count is greater than 50,000.

## 2016-10-08 NOTE — Assessment & Plan Note (Addendum)
The patient remained asymptomatic.  Blood work is stable with no significant signs of disease progression I recommend a yearly visit from now on with history, physical examination and blood work. 

## 2016-10-08 NOTE — Telephone Encounter (Signed)
Appointments scheduled per 10/08/16 los. °Patient was given a copy of the AVS report and appointment schedule, per 10/08/16 los. °

## 2016-10-08 NOTE — Progress Notes (Signed)
[facility] OFFICE PROGRESS NOTE  Patient Care Team: Melony Overly, MD as PCP - General (Family Medicine) Heath Lark, MD as Consulting Physician (Hematology and Oncology)  SUMMARY OF ONCOLOGIC HISTORY:   CLL (chronic lymphocytic leukemia) ([place])   09/26/2009 Pathology Results    Flow cytometry confirmed diagnosis of CLL. LEUKEMIC CELLS ARE NEGATIVE FOR CD38 AND ZAP70 EXPRESSION      He was found to have CLL on routine blood work when he was found to have significant lymphocytosis. In June of 2011, flow cytometry confirmed diagnosis of CLL.  INTERVAL HISTORY: Please see below for problem oriented charting. He returns for further follow-up Denies new lymphadenopathy Denies recent infection The patient denies any recent signs or symptoms of bleeding such as spontaneous epistaxis, hematuria or hematochezia. He remains very active with his garden  REVIEW OF SYSTEMS:   Constitutional: Denies fevers, chills or abnormal weight loss Eyes: Denies blurriness of vision Ears, nose, mouth, throat, and face: Denies mucositis or sore throat Respiratory: Denies cough, dyspnea or wheezes Cardiovascular: Denies palpitation, chest discomfort or lower extremity swelling Gastrointestinal:  Denies nausea, heartburn or change in bowel habits Skin: Denies abnormal skin rashes Lymphatics: Denies new lymphadenopathy  Neurological:Denies numbness, tingling or new weaknesses Behavioral/Psych: Mood is stable, no new changes  All other systems were reviewed with the patient and are negative.  I have reviewed the past medical history, past surgical history, social history and family history with the patient and they are unchanged from previous note.  ALLERGIES:  has No Known Allergies.  MEDICATIONS:  Current Outpatient Prescriptions  Medication Sig Dispense Refill  . aspirin 81 MG tablet Take 81 mg by mouth daily.    . Calcium Carbonate-Simethicone (MAALOX MAX PO) Take 5 mLs by mouth  daily as needed (upset stomach).    . co-enzyme Q-10 30 MG capsule Take 30 mg by mouth daily.    . fish oil-omega-3 fatty acids 1000 MG capsule Take 1 g by mouth daily.     Marland Kitchen ibuprofen (ADVIL,MOTRIN) 200 MG tablet Take 200 mg by mouth every 6 (six) hours as needed for mild pain.    Marland Kitchen lisinopril (PRINIVIL,ZESTRIL) 10 MG tablet Take 1 tablet (10 mg total) by mouth daily. 90 tablet 3  . Multiple Vitamin (MULTIVITAMIN) tablet Take 1 tablet by mouth daily.    . Probiotic Product (PROBIOTIC ADVANCED PO) Take by mouth. Unknown strength takes 3xweek     No current facility-administered medications for this visit.     PHYSICAL EXAMINATION: ECOG PERFORMANCE STATUS: 1 - Symptomatic but completely ambulatory  Vitals:   10/08/16 0906  BP: (!) 144/55  Pulse: (!) 57  Resp: 18  Temp: 97.9 F (36.6 C)   Filed Weights   10/08/16 0906  Weight: 176 lb 1.6 oz (79.9 kg)    GENERAL:alert, no distress and comfortable SKIN: skin color, texture, turgor are normal, no rashes or significant lesions.  Noted skin bruises EYES: normal, Conjunctiva are pink and non-injected, sclera clear OROPHARYNX:no exudate, no erythema and lips, buccal mucosa, and tongue normal  NECK: supple, thyroid normal size, non-tender, without nodularity LYMPH:  no palpable lymphadenopathy in the cervical, axillary or inguinal LUNGS: clear to auscultation and percussion with normal breathing effort HEART: regular rate & rhythm and no murmurs and no lower extremity edema ABDOMEN:abdomen soft, non-tender and normal bowel sounds Musculoskeletal:no cyanosis of digits and no clubbing  NEURO: alert & oriented x 3 with fluent speech, no focal motor/sensory deficits  LABORATORY DATA:  I have  reviewed the data as listed    Component Value Date/Time   NA 139 02/05/2016 1410   NA 141 09/30/2015 0808   K 4.2 02/05/2016 1410   K 4.5 09/30/2015 0808   CL 109 02/05/2016 1410   CL 108 (H) 08/25/2012 1040   CO2 26 02/05/2016 1410   CO2 25  09/30/2015 0808   GLUCOSE 115 (H) 02/05/2016 1410   GLUCOSE 89 09/30/2015 0808   GLUCOSE 91 08/25/2012 1040   BUN 15 02/05/2016 1410   BUN 23.3 09/30/2015 0808   CREATININE 0.95 02/05/2016 1410   CREATININE 1.0 09/30/2015 0808   CALCIUM 8.5 (L) 02/05/2016 1410   CALCIUM 8.7 09/30/2015 0808   PROT 5.8 (L) 09/30/2015 0808   ALBUMIN 3.5 09/30/2015 0808   AST 16 09/30/2015 0808   ALT 13 09/30/2015 0808   ALKPHOS 65 09/30/2015 0808   BILITOT 0.51 09/30/2015 0808   GFRNONAA >60 02/05/2016 1410   GFRAA >60 02/05/2016 1410    No results found for: SPEP, UPEP  Lab Results  Component Value Date   WBC 33.6 (H) 10/08/2016   NEUTROABS 2.0 10/08/2016   HGB 11.1 (L) 10/08/2016   HCT 34.9 (L) 10/08/2016   MCV 104.5 (H) 10/08/2016   PLT 145 10/08/2016      Chemistry      Component Value Date/Time   NA 139 02/05/2016 1410   NA 141 09/30/2015 0808   K 4.2 02/05/2016 1410   K 4.5 09/30/2015 0808   CL 109 02/05/2016 1410   CL 108 (H) 08/25/2012 1040   CO2 26 02/05/2016 1410   CO2 25 09/30/2015 0808   BUN 15 02/05/2016 1410   BUN 23.3 09/30/2015 0808   CREATININE 0.95 02/05/2016 1410   CREATININE 1.0 09/30/2015 0808      Component Value Date/Time   CALCIUM 8.5 (L) 02/05/2016 1410   CALCIUM 8.7 09/30/2015 0808   ALKPHOS 65 09/30/2015 0808   AST 16 09/30/2015 0808   ALT 13 09/30/2015 0808   BILITOT 0.51 09/30/2015 0808       ASSESSMENT & PLAN:  CLL (chronic lymphocytic leukemia) The patient remained asymptomatic.  Blood work is stable with no significant signs of disease progression I recommend a yearly visit from now on with history, physical examination and blood work.  Pancytopenia, acquired ([place]) The cause is likely related to CLL. It is mild and there is little change compared from previous hemoglobin and platelet count. The patient denies recent history of bleeding such as epistaxis, hematuria or hematochezia. He is asymptomatic from the thrombocytopenia and anemia. I  will observe for now.  There is no contraindication to remain on aspirin as well as blood count is greater than 50,000.     No orders of the defined types were placed in this encounter.  All questions were answered. The patient knows to call the clinic with any problems, questions or concerns. No barriers to learning was detected. I spent 10 minutes counseling the patient face to face. The total time spent in the appointment was 15 minutes and more than 50% was on counseling and review of test results     Heath Lark, MD 10/08/2016 9:51 AM

## 2016-10-17 LAB — CUP PACEART REMOTE DEVICE CHECK
Date Time Interrogation Session: 20180624183857
Implantable Pulse Generator Implant Date: 20180125

## 2016-10-17 NOTE — Progress Notes (Signed)
Carelink summary report received. Battery status OK. Normal device function. No new symptom episodes, tachy episodes, brady, or pause episodes. No new AF episodes. Monthly summary reports and ROV/PRN 

## 2016-10-30 ENCOUNTER — Telehealth: Payer: Self-pay | Admitting: *Deleted

## 2016-10-30 NOTE — Telephone Encounter (Signed)
Manual transmission received.  Symptom episode appears SR w/occasional PVCs and oversensing.  Advised patient and family of findings.  Patient's daughter agrees to call the [facility] in the future if patient has a symptom episode.  She denies additional questions or concerns at this time.  They also request a new symptom activator.  Advised I will order from [employer] to be shipped directly to his house.  Patient and family appreciative.  Tachy episode from 10/29/16 printed and placed in [facility] folder for review by Dr. Lovena Le.

## 2016-10-30 NOTE — Telephone Encounter (Signed)
Spoke with patient's wife, Mardene Celeste ([place]), as patient is HOH.  Mardene Celeste reports that patient had a brief dizzy spell during symptom episode on 10/26/16 around 2000.  Advised that I will need a manual transmission to review the rest of the episode.  She requests that I call back so that she has time to get him near the monitor.  Also discussed tachy episode on 10/29/16 at [facility].  Patient was asymptomatic with this episode per wife.

## 2016-11-06 ENCOUNTER — Ambulatory Visit (INDEPENDENT_AMBULATORY_CARE_PROVIDER_SITE_OTHER): Payer: PPO | Admitting: *Deleted

## 2016-11-06 DIAGNOSIS — R55 Syncope and collapse: Secondary | ICD-10-CM

## 2016-11-07 NOTE — Progress Notes (Signed)
Carelink Summary Report / Loop Recorder 

## 2016-11-22 LAB — CUP PACEART REMOTE DEVICE CHECK
Implantable Pulse Generator Implant Date: 20180125
MDC IDC SESS DTM: 20180724203847

## 2016-12-04 DIAGNOSIS — C44219 Basal cell carcinoma of skin of left ear and external auricular canal: Secondary | ICD-10-CM | POA: Diagnosis not present

## 2016-12-04 DIAGNOSIS — L570 Actinic keratosis: Secondary | ICD-10-CM | POA: Diagnosis not present

## 2016-12-05 DIAGNOSIS — H6120 Impacted cerumen, unspecified ear: Secondary | ICD-10-CM | POA: Diagnosis not present

## 2016-12-06 ENCOUNTER — Ambulatory Visit (INDEPENDENT_AMBULATORY_CARE_PROVIDER_SITE_OTHER): Payer: PPO | Admitting: *Deleted

## 2016-12-06 DIAGNOSIS — R55 Syncope and collapse: Secondary | ICD-10-CM

## 2016-12-07 NOTE — Progress Notes (Signed)
Carelink Summary Report / Loop Recorder 

## 2016-12-11 LAB — CUP PACEART REMOTE DEVICE CHECK
Date Time Interrogation Session: 20180823213743
MDC IDC PG IMPLANT DT: 20180125

## 2016-12-11 NOTE — Progress Notes (Signed)
Carelink summary report received. Battery status OK. Normal device function. No new symptom episodes, tachy episodes, brady, or pause episodes. No new AF episodes. Monthly summary reports and ROV/PRN 

## 2016-12-28 ENCOUNTER — Other Ambulatory Visit: Payer: Self-pay | Admitting: Internal Medicine

## 2017-01-02 DIAGNOSIS — C44219 Basal cell carcinoma of skin of left ear and external auricular canal: Secondary | ICD-10-CM | POA: Diagnosis not present

## 2017-01-02 HISTORY — PX: MOHS SURGERY: SUR867

## 2017-01-07 ENCOUNTER — Ambulatory Visit (INDEPENDENT_AMBULATORY_CARE_PROVIDER_SITE_OTHER): Payer: PPO | Admitting: *Deleted

## 2017-01-07 DIAGNOSIS — R55 Syncope and collapse: Secondary | ICD-10-CM

## 2017-01-07 NOTE — Progress Notes (Signed)
Carelink Summary Report / Loop Recorder 

## 2017-01-08 ENCOUNTER — Telehealth: Payer: Self-pay | Admitting: *Deleted

## 2017-01-08 ENCOUNTER — Ambulatory Visit (INDEPENDENT_AMBULATORY_CARE_PROVIDER_SITE_OTHER): Payer: PPO | Admitting: Internal Medicine

## 2017-01-08 ENCOUNTER — Encounter: Payer: Self-pay | Admitting: Internal Medicine

## 2017-01-08 VITALS — BP 128/60 | HR 57 | Ht 71.0 in | Wt 170.8 lb

## 2017-01-08 DIAGNOSIS — I255 Ischemic cardiomyopathy: Secondary | ICD-10-CM

## 2017-01-08 LAB — CUP PACEART REMOTE DEVICE CHECK
Date Time Interrogation Session: 20180922221243
Implantable Pulse Generator Implant Date: 20180125

## 2017-01-08 NOTE — Progress Notes (Signed)
HPI [name] returns today after being found to have worrisome nonsustained polymorphic ventricular tachycardia. The patient is an 83 year old man with a history of coronary artery disease status post MI, unexplained syncope, and left bundle branch block. His EF by echo 10 months ago demonstrates an EF of 30%.  When I saw him several months ago, he was stable and had no specific complaints and denied chest pain. Perhaps class II heart failure. The patient had previously undergone insertion of an implantable loop recorder. Yesterday, he had an episode of very rapid nonsustained polymorphic ventricular tachycardia at rates of 250 to almost 300 bpm. He presents today to discuss additional treatment options. He had near syncope with the episode. 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  No Known Allergies   Current Outpatient Prescriptions  Medication Sig Dispense Refill  . aspirin 81 MG tablet Take 81 mg by mouth daily.    . Calcium Carbonate-Simethicone (MAALOX MAX PO) Take 5 mLs by mouth daily as needed (upset stomach).    . co-enzyme Q-10 30 MG capsule Take 30 mg by mouth daily.    . fish oil-omega-3 fatty acids 1000 MG capsule Take 1 g by mouth daily.     Marland Kitchen ibuprofen (ADVIL,MOTRIN) 200 MG tablet Take 200 mg by mouth every 6 (six) hours as needed for mild pain.    Marland Kitchen lisinopril (PRINIVIL,ZESTRIL) 10 MG tablet Take 1 tablet (10 mg total) by mouth daily. 90 tablet 3  . Multiple Vitamin (MULTIVITAMIN) tablet Take 1 tablet by mouth daily.    . Probiotic Product (PROBIOTIC ADVANCED PO) Take by mouth. Unknown strength takes 3xweek     No current facility-administered medications for this visit.      Past Medical History:  Diagnosis Date  . Borderline diabetes   . Cardiomyopathy, dilated ([place]) 03/2016   Unclear etiology  . CLL (chronic lymphocytic leukemia) ([place]) 09/2009   stage 0; on observation  . Coronary artery disease involving native heart without angina  pectoris    PTCA of RCA in setting of an MI in 1986-87; no follow-up study since available.  . Essential hypertension   . Hyperlipidemia with target LDL less than 70    With known CAD  . Left bundle branch block (LBBB) on electrocardiogram    Noted as far back as 2008.  . 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   Past Surgical History:  Procedure Laterality Date  . EP IMPLANTABLE DEVICE N/A 05/10/2016   Procedure: Loop Recorder Insertion;  Surgeon: Evans Lance, MD;  Location: [facility];  Service: Cardiovascular;  Laterality: N/A;  slight blood drop on dressing  . REPLACEMENT TOTAL KNEE    . TRANSTHORACIC ECHOCARDIOGRAM  03/2016   Normal LV size with mild LV hypertrophy. EF 30%. Diffuse  hypokinesis wtih septal-lateral dyssynchrony. Normal RV size and systolic function. Mild MR and mild AI.     No family history on file.   Social History   Social History  . Marital status: Married    Spouse name: N/A  . Number of children: N/A  . Years of education: N/A   Occupational History  . Not on file.   Social History Main Topics  . Smoking status: Never Smoker  . Smokeless tobacco: Never Used  . Alcohol use No  .  Drug use: No  . Sexual activity: Not on file   Other Topics Concern  . Not on file   Social History Narrative  . No narrative on file     Ht 5\' 11"  (1.803 m)   Wt 170 lb 12.8 oz (77.5 kg)   BMI 23.82 kg/m   Physical Exam:  Well appearing elderly man, NAD HEENT: Unremarkable Neck:  6 cm JVD, no thyromegally Lymphatics:  No adenopathy Back:  No CVA tenderness Lungs:  Clear with no wheezes HEART:  Regular rate rhythm, no murmurs, no rubs, no clicks Abd:  soft, positive bowel sounds, no organomegally, no rebound, no guarding Ext:  2 plus pulses, no edema, no cyanosis, no clubbing Skin:  No rashes no nodules Neuro:  CN II  through XII intact, motor grossly intact  EKG - reviewed, NSR with LBBB  DEVICE  ILR demonstrates PMVT at 300/min, non-sustained but prolonged.   Assess/Plan: 1. Syncope - he has multiple etiologies but yesterday almost passed out with VT at 300/min. I have recommended ICD insertion. 2. Chronic systolic heart failure - his EF is 30% and he has class 2 symptoms and a QRS duration of 170 ms with LBBB. I have recommended he have a biv ICD. 3. ICM - he denies anginal symptoms despite working very vigorously.  4. Sinus node dysfunction - he has had a h/o this in the past but has been minimally symptomatic. He will have an atrial lead.  Gregory Rodgers.D.

## 2017-01-08 NOTE — Telephone Encounter (Signed)
Reviewed episode with GT who recommended that patient come into the office today if possible. Spoke with daughter who states that they live an hour away but would be able to come at 4:30pm. Appointment made for 9/25 at 1530.

## 2017-01-08 NOTE — Patient Instructions (Signed)
Medication Instructions:  Your physician recommends that you continue on your current medications as directed. Please refer to the Current Medication list given to you today.  Labwork: You will get all your labwork done tomorrow after admission.  Testing/Procedures: Your physician has recommended that you have a defibrillator inserted. An implantable cardioverter defibrillator (ICD) is a small device that is placed in your chest or, in rare cases, your abdomen. This device uses electrical pulses or shocks to help control life-threatening, irregular heartbeats that could lead the heart to suddenly stop beating (sudden cardiac arrest). Leads are attached to the ICD that goes into your heart. This is done in the hospital and usually requires an overnight stay.  .  Follow-Up: You will follow up with the device clinic 10-14 days after your procedure for a wound check.  You will follow up with Dr. Lovena Le 91 days after your procedure.  Any Other Special Instructions Will Be Listed Below (If Applicable).  Please arrive at the [facility] main entrance of [place] hospital at:  01/09/2017 at 1:00 pm You may have a light breakfast before 9:00 am.  Do not eat anything after 9:00 am. You may take your morning medication with your breakfast. Use the CHG scrub as directed.  Plan for one night stay You will need someone to drive you home at discharge  If you need a refill on your cardiac medications before your next appointment, please call your pharmacy.   Cardioverter Defibrillator Implantation An implantable cardioverter defibrillator (ICD) is a small device that is placed under the skin in the chest or abdomen. An ICD consists of a battery, a small computer (pulse generator), and wires (leads) that go into the heart. An ICD is used to detect and correct two types of dangerous irregular heartbeats (arrhythmias):  A rapid heart rhythm (tachycardia).  An arrhythmia in which the lower chambers of  the heart (ventricles) contract in an uncoordinated way (fibrillation).  When an ICD detects tachycardia, it sends a low-energy shock to the heart to restore the heartbeat to normal (cardioversion). This signal is usually painless. If cardioversion does not work or if the ICD detects fibrillation, it delivers a high-energy shock to the heart (defibrillation) to restart the heart. This shock may feel like a strong jolt in the chest. Your health care provider may prescribe an ICD if:  You have had an arrhythmia that originated in the ventricles.  Your heart has been damaged by a disease or heart condition.  Sometimes, ICDs are programmed to act as a device called a pacemaker. Pacemakers can be used to treat a slow heartbeat (bradycardia) or tachycardia by taking over the heart rate with electrical impulses. Tell a health care provider about:  Any allergies you have.  All medicines you are taking, including vitamins, herbs, eye drops, creams, and over-the-counter medicines.  Any problems you or family members have had with anesthetic medicines.  Any blood disorders you have.  Any surgeries you have had.  Any medical conditions you have.  Whether you are pregnant or may be pregnant. What are the risks? Generally, this is a safe procedure. However, problems may occur, including:  Swelling, bleeding, or bruising.  Infection.  Blood clots.  Damage to other structures or organs, such as nerves, blood vessels, or the heart.  Allergic reactions to medicines used during the procedure.  What happens before the procedure? Staying hydrated Follow instructions from your health care provider about hydration, which may include:  Up to 2 hours before  the procedure - you may continue to drink clear liquids, such as water, clear fruit juice, black coffee, and plain tea.  Eating and drinking restrictions Follow instructions from your health care provider about eating and drinking, which may  include:  8 hours before the procedure - stop eating heavy meals or foods such as meat, fried foods, or fatty foods.  6 hours before the procedure - stop eating light meals or foods, such as toast or cereal.  6 hours before the procedure - stop drinking milk or drinks that contain milk.  2 hours before the procedure - stop drinking clear liquids.  Medicine Ask your health care provider about:  Changing or stopping your normal medicines. This is important if you take diabetes medicines or blood thinners.  Taking medicines such as aspirin and ibuprofen. These medicines can thin your blood. Do not take these medicines before your procedure if your doctor tells you not to.  Tests  You may have blood tests.  You may have a test to check the electrical signals in your heart (electrocardiogram, ECG).  You may have imaging tests, such as a chest X-ray. General instructions  For 24 hours before the procedure, stop using products that contain nicotine or tobacco, such as cigarettes and e-cigarettes. If you need help quitting, ask your health care provider.  Plan to have someone take you home from the hospital or clinic.  You may be asked to shower with a germ-killing soap. What happens during the procedure?  To reduce your risk of infection: ? Your health care team will wash or sanitize their hands. ? Your skin will be washed with soap. ? Hair may be removed from the surgical area.  Small monitors will be put on your body. They will be used to check your heart, blood pressure, and oxygen level.  An IV tube will be inserted into one of your veins.  You will be given one or more of the following: ? A medicine to help you relax (sedative). ? A medicine to numb the area (local anesthetic). ? A medicine to make you fall asleep (general anesthetic).  Leads will be guided through a blood vessel into your heart and attached to your heart muscles. Depending on the ICD, the leads may go  into one ventricle or they may go into both ventricles and into an upper chamber of the heart. An X-ray machine (fluoroscope) will be usedto help guide the leads.  A small incision will be made to create a deep pocket under your skin.  The pulse generator will be placed into the pocket.  The ICD will be tested.  The incision will be closed with stitches (sutures), skin glue, or staples.  A bandage (dressing) will be placed over the incision. This procedure may vary among health care providers and hospitals. What happens after the procedure?  Your blood pressure, heart rate, breathing rate, and blood oxygen level will be monitored often until the medicines you were given have worn off.  A chest X-ray will be taken to check that the ICD is in the right place.  You will need to stay in the hospital for 1-2 days so your health care provider can make sure your ICD is working.  Do not drive for 24 hours if you received a sedative. Ask your health care provider when it is safe for you to drive.  You may be given an identification card explaining that you have an ICD. Summary  An implantable cardioverter defibrillator (  ICD) is a small device that is placed under the skin in the chest or abdomen. It is used to detect and correct dangerous irregular heartbeats (arrhythmias).  An ICD consists of a battery, a small computer (pulse generator), and wires (leads) that go into the heart.  When an ICD detects rapid heart rhythm (tachycardia), it sends a low-energy shock to the heart to restore the heartbeat to normal (cardioversion). If cardioversion does not work or if the ICD detects uncoordinated heart contractions (fibrillation), it delivers a high-energy shock to the heart (defibrillation) to restart the heart.  You will need to stay in the hospital for 1-2 days to make sure your ICD is working. This information is not intended to replace advice given to you by your health care provider. Make  sure you discuss any questions you have with your health care provider. Document Released: 12/23/2001 Document Revised: 04/11/2016 Document Reviewed: 04/11/2016 Elsevier Interactive Patient Education  2017 [employer].

## 2017-01-08 NOTE — Telephone Encounter (Signed)
Spoke w/ pt daughter and instructed her how to send a manual transmission. Informed her a Research scientist (life sciences) will review and call back. Pt daughter verbalized understanding.

## 2017-01-08 NOTE — Telephone Encounter (Signed)
Spoke with patient's wife regarding tachy episode noted on LINQ from 01/07/17 at 0717.  Per wife, patient was asymptomatic with episode, usually wakes up around that time (ILR time stamp may be up to one hour off due to daylight savings).  Patient should be home in an hour or so and they will call to the [facility] for assistance with sending a manual transmission for review of additional tachy episode that did not transmit automatically.

## 2017-01-08 NOTE — Telephone Encounter (Signed)
Late entry from 01/08/17 at 1425:  Spoke with Ria Clock, RN to make her aware of tachy episode that suggests polymorphic VT.  She will plan to review episode with Dr. Lovena Le for recommendations.

## 2017-01-09 ENCOUNTER — Encounter (HOSPITAL_COMMUNITY): Payer: Self-pay | Admitting: General Practice

## 2017-01-09 ENCOUNTER — Ambulatory Visit (HOSPITAL_COMMUNITY)
Admission: RE | Admit: 2017-01-09 | Discharge: 2017-01-10 | Disposition: A | Discharge status: Home / Self Care | Payer: PPO | Source: Ambulatory Visit | Attending: Internal Medicine | Admitting: Internal Medicine

## 2017-01-09 ENCOUNTER — Ambulatory Visit (HOSPITAL_COMMUNITY)
Admission: RE | Disposition: A | Discharge status: Home / Self Care | Payer: Self-pay | Source: Ambulatory Visit | Attending: Internal Medicine

## 2017-01-09 ENCOUNTER — Other Ambulatory Visit: Payer: Self-pay | Admitting: Internal Medicine

## 2017-01-09 DIAGNOSIS — I4729 Other ventricular tachycardia: Secondary | ICD-10-CM

## 2017-01-09 DIAGNOSIS — I255 Ischemic cardiomyopathy: Secondary | ICD-10-CM | POA: Insufficient documentation

## 2017-01-09 DIAGNOSIS — I5022 Chronic systolic (congestive) heart failure: Secondary | ICD-10-CM | POA: Diagnosis not present

## 2017-01-09 DIAGNOSIS — I110 Hypertensive heart disease with heart failure: Secondary | ICD-10-CM | POA: Insufficient documentation

## 2017-01-09 DIAGNOSIS — E785 Hyperlipidemia, unspecified: Secondary | ICD-10-CM | POA: Diagnosis not present

## 2017-01-09 DIAGNOSIS — I2510 Atherosclerotic heart disease of native coronary artery without angina pectoris: Secondary | ICD-10-CM | POA: Diagnosis not present

## 2017-01-09 DIAGNOSIS — I472 Ventricular tachycardia: Secondary | ICD-10-CM | POA: Insufficient documentation

## 2017-01-09 DIAGNOSIS — Z955 Presence of coronary angioplasty implant and graft: Secondary | ICD-10-CM | POA: Diagnosis not present

## 2017-01-09 DIAGNOSIS — I447 Left bundle-branch block, unspecified: Secondary | ICD-10-CM | POA: Insufficient documentation

## 2017-01-09 DIAGNOSIS — I420 Dilated cardiomyopathy: Secondary | ICD-10-CM | POA: Insufficient documentation

## 2017-01-09 DIAGNOSIS — Z7982 Long term (current) use of aspirin: Secondary | ICD-10-CM | POA: Insufficient documentation

## 2017-01-09 DIAGNOSIS — R7303 Prediabetes: Secondary | ICD-10-CM | POA: Insufficient documentation

## 2017-01-09 DIAGNOSIS — I252 Old myocardial infarction: Secondary | ICD-10-CM | POA: Insufficient documentation

## 2017-01-09 DIAGNOSIS — Z95810 Presence of automatic (implantable) cardiac defibrillator: Secondary | ICD-10-CM

## 2017-01-09 HISTORY — DX: Personal history of urinary calculi: Z87.442

## 2017-01-09 HISTORY — DX: Basal cell carcinoma of skin, unspecified: C44.91

## 2017-01-09 HISTORY — PX: BIV ICD INSERTION CRT-D: EP1195

## 2017-01-09 LAB — GLUCOSE, CAPILLARY: Glucose-Capillary: 124 mg/dL — ABNORMAL HIGH (ref 65–99)

## 2017-01-09 LAB — BASIC METABOLIC PANEL
Anion gap: 6 (ref 5–15)
BUN: 17 mg/dL (ref 6–20)
CALCIUM: 8.7 mg/dL — AB (ref 8.9–10.3)
CO2: 25 mmol/L (ref 22–32)
CREATININE: 0.91 mg/dL (ref 0.61–1.24)
Chloride: 106 mmol/L (ref 101–111)
GFR calc non Af Amer: >60 mL/min (ref >60)
GLUCOSE: 93 mg/dL (ref 65–99)
Potassium: 4.7 mmol/L (ref 3.5–5.1)
Sodium: 137 mmol/L (ref 135–145)

## 2017-01-09 LAB — CBC
HEMATOCRIT: 34.4 % — AB (ref 39.0–52.0)
Hemoglobin: 10.8 g/dL — ABNORMAL LOW (ref 13.0–17.0)
MCH: 32.4 pg (ref 26.0–34.0)
MCHC: 31.4 g/dL (ref 30.0–36.0)
MCV: 103.3 fL — AB (ref 78.0–100.0)
Platelets: 140 10*3/uL — ABNORMAL LOW (ref 150–400)
RBC: 3.33 MIL/uL — ABNORMAL LOW (ref 4.22–5.81)
RDW: 13.3 % (ref 11.5–15.5)
WBC: 36.4 10*3/uL — ABNORMAL HIGH (ref 4.0–10.5)

## 2017-01-09 LAB — SURGICAL PCR SCREEN
MRSA, PCR: NEGATIVE
Staphylococcus aureus: NEGATIVE

## 2017-01-09 SURGERY — BIV ICD INSERTION CRT-D

## 2017-01-09 MED ORDER — LIDOCAINE HCL (PF) 1 % IJ SOLN
INTRAMUSCULAR | Status: DC | PRN
  Administered 2017-01-09: 50 mL via SUBCUTANEOUS

## 2017-01-09 MED ORDER — ONDANSETRON HCL 4 MG/2ML IJ SOLN: 4.0000 mg | Freq: Four times a day (QID) | INTRAMUSCULAR | Status: DC | PRN

## 2017-01-09 MED ORDER — MUPIROCIN 2 % EX OINT
TOPICAL_OINTMENT | CUTANEOUS | Status: AC
  Administered 2017-01-09: 1 via TOPICAL
  Filled 2017-01-09: qty 22

## 2017-01-09 MED ORDER — HEPARIN (PORCINE) IN NACL 2-0.9 UNIT/ML-% IJ SOLN
INTRAMUSCULAR | Status: AC
  Filled 2017-01-09: qty 1000

## 2017-01-09 MED ORDER — ACETAMINOPHEN 325 MG PO TABS
325.0000 mg | ORAL_TABLET | ORAL | Status: DC | PRN
  Administered 2017-01-09 – 2017-01-10 (×2): 650 mg via ORAL
  Filled 2017-01-09 (×2): qty 2

## 2017-01-09 MED ORDER — COENZYME Q10 30 MG PO CAPS: 30.0000 mg | ORAL_CAPSULE | Freq: Every day | ORAL | Status: DC

## 2017-01-09 MED ORDER — SODIUM CHLORIDE 0.9 % IR SOLN
80.0000 mg | Status: AC
  Administered 2017-01-09: 80 mg

## 2017-01-09 MED ORDER — MIDAZOLAM HCL 5 MG/5ML IJ SOLN
INTRAMUSCULAR | Status: DC | PRN
  Administered 2017-01-09: 1 mg via INTRAVENOUS
  Administered 2017-01-09: 2 mg via INTRAVENOUS

## 2017-01-09 MED ORDER — MIDAZOLAM HCL 5 MG/5ML IJ SOLN
INTRAMUSCULAR | Status: AC
  Filled 2017-01-09: qty 5

## 2017-01-09 MED ORDER — CEFAZOLIN SODIUM-DEXTROSE 1-4 GM/50ML-% IV SOLN
1.0000 g | Freq: Four times a day (QID) | INTRAVENOUS | Status: AC
  Administered 2017-01-09 – 2017-01-10 (×3): 1 g via INTRAVENOUS
  Filled 2017-01-09 (×3): qty 50

## 2017-01-09 MED ORDER — SODIUM CHLORIDE 0.9 % IR SOLN
Status: AC
  Filled 2017-01-09: qty 2

## 2017-01-09 MED ORDER — CEFAZOLIN SODIUM-DEXTROSE 2-4 GM/100ML-% IV SOLN
INTRAVENOUS | Status: AC
  Filled 2017-01-09: qty 100

## 2017-01-09 MED ORDER — LISINOPRIL 10 MG PO TABS
10.0000 mg | ORAL_TABLET | Freq: Every day | ORAL | Status: DC
  Administered 2017-01-10: 10 mg via ORAL
  Filled 2017-01-09: qty 1

## 2017-01-09 MED ORDER — FENTANYL CITRATE (PF) 100 MCG/2ML IJ SOLN
INTRAMUSCULAR | Status: AC
  Filled 2017-01-09: qty 2

## 2017-01-09 MED ORDER — SODIUM CHLORIDE 0.9 % IV SOLN
INTRAVENOUS | Status: DC
  Administered 2017-01-09: 15:00:00 via INTRAVENOUS

## 2017-01-09 MED ORDER — FENTANYL CITRATE (PF) 100 MCG/2ML IJ SOLN
INTRAMUSCULAR | Status: DC | PRN
  Administered 2017-01-09: 12.5 ug via INTRAVENOUS
  Administered 2017-01-09: 25 ug via INTRAVENOUS
  Administered 2017-01-09 (×2): 12.5 ug via INTRAVENOUS

## 2017-01-09 MED ORDER — IOPAMIDOL (ISOVUE-370) INJECTION 76%
INTRAVENOUS | Status: DC | PRN
  Administered 2017-01-09: 10 mL via INTRAVENOUS

## 2017-01-09 MED ORDER — OMEGA-3-ACID ETHYL ESTERS 1 G PO CAPS
1.0000 g | ORAL_CAPSULE | Freq: Every day | ORAL | Status: DC
  Administered 2017-01-10: 1 g via ORAL
  Filled 2017-01-09: qty 1

## 2017-01-09 MED ORDER — METOPROLOL SUCCINATE ER 25 MG PO TB24
25.0000 mg | ORAL_TABLET | Freq: Every day | ORAL | Status: DC
  Administered 2017-01-09 – 2017-01-10 (×2): 25 mg via ORAL
  Filled 2017-01-09 (×2): qty 1

## 2017-01-09 MED ORDER — MUPIROCIN 2 % EX OINT
1.0000 "application " | TOPICAL_OINTMENT | Freq: Once | CUTANEOUS | Status: AC
  Administered 2017-01-09: 1 via TOPICAL

## 2017-01-09 MED ORDER — HEPARIN (PORCINE) IN NACL 2-0.9 UNIT/ML-% IJ SOLN
INTRAMUSCULAR | Status: AC | PRN
  Administered 2017-01-09: 500 mL

## 2017-01-09 MED ORDER — ASPIRIN EC 81 MG PO TBEC
81.0000 mg | DELAYED_RELEASE_TABLET | Freq: Every day | ORAL | Status: DC
  Filled 2017-01-09: qty 1

## 2017-01-09 MED ORDER — CEFAZOLIN SODIUM-DEXTROSE 2-4 GM/100ML-% IV SOLN
2.0000 g | INTRAVENOUS | Status: AC
  Administered 2017-01-09: 2 g via INTRAVENOUS

## 2017-01-09 MED ORDER — IOPAMIDOL (ISOVUE-370) INJECTION 76%
INTRAVENOUS | Status: AC
  Filled 2017-01-09: qty 50

## 2017-01-09 MED ORDER — CHLORHEXIDINE GLUCONATE 4 % EX LIQD: 60.0000 mL | Freq: Once | CUTANEOUS | Status: DC

## 2017-01-09 SURGICAL SUPPLY — 17 items
ASSURA CRTD CD3369-40C (ICD Generator) ×3 IMPLANT
CABLE SURGICAL S-101-97-12 (CABLE) ×2 IMPLANT
CATH CPS DIRECT 135 DS2C020 (CATHETERS) ×2 IMPLANT
CATH HEX JOSEPH 2-5-2 65CM 6F (CATHETERS) ×2 IMPLANT
CPS IMPLANT KIT 410190 (MISCELLANEOUS) ×2 IMPLANT
DEFIB ASSURA CRT-D (ICD Generator) IMPLANT
KIT ESSENTIALS PG (KITS) ×2 IMPLANT
LEAD DURATA 7122-65CM (Lead) ×2 IMPLANT
LEAD QUARTET 1458QL-86 (Lead) IMPLANT
LEAD TENDRIL MRI 52CM LPA1200M (Lead) ×2 IMPLANT
PAD DEFIB LIFELINK (PAD) ×2 IMPLANT
QUARTET 1458QL-86 (Lead) ×3 IMPLANT
SHEATH CLASSIC 7F (SHEATH) ×2 IMPLANT
SHEATH CLASSIC 8F (SHEATH) ×2 IMPLANT
SLITTER UNIVERSAL DS2A003 (MISCELLANEOUS) ×2 IMPLANT
TRAY PACEMAKER INSERTION (PACKS) ×2 IMPLANT
WIRE MAILMAN 182CM (WIRE) ×2 IMPLANT

## 2017-01-09 NOTE — H&P (View-Only) (Signed)
HPI [name] returns today after being found to have worrisome nonsustained polymorphic ventricular tachycardia. The patient is an 83 year old man with a history of coronary artery disease status post MI, unexplained syncope, and left bundle branch block. His EF by echo 10 months ago demonstrates an EF of 30%.  When I saw him several months ago, he was stable and had no specific complaints and denied chest pain. Perhaps class II heart failure. The patient had previously undergone insertion of an implantable loop recorder. Yesterday, he had an episode of very rapid nonsustained polymorphic ventricular tachycardia at rates of 250 to almost 300 bpm. He presents today to discuss additional treatment options. He had near syncope with the episode. 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  No Known Allergies   Current Outpatient Prescriptions  Medication Sig Dispense Refill  . aspirin 81 MG tablet Take 81 mg by mouth daily.    . Calcium Carbonate-Simethicone (MAALOX MAX PO) Take 5 mLs by mouth daily as needed (upset stomach).    . co-enzyme Q-10 30 MG capsule Take 30 mg by mouth daily.    . fish oil-omega-3 fatty acids 1000 MG capsule Take 1 g by mouth daily.     Marland Kitchen ibuprofen (ADVIL,MOTRIN) 200 MG tablet Take 200 mg by mouth every 6 (six) hours as needed for mild pain.    Marland Kitchen lisinopril (PRINIVIL,ZESTRIL) 10 MG tablet Take 1 tablet (10 mg total) by mouth daily. 90 tablet 3  . Multiple Vitamin (MULTIVITAMIN) tablet Take 1 tablet by mouth daily.    . Probiotic Product (PROBIOTIC ADVANCED PO) Take by mouth. Unknown strength takes 3xweek     No current facility-administered medications for this visit.      Past Medical History:  Diagnosis Date  . Borderline diabetes   . Cardiomyopathy, dilated ([place]) 03/2016   Unclear etiology  . CLL (chronic lymphocytic leukemia) ([place]) 09/2009   stage 0; on observation  . Coronary artery disease involving native heart without angina  pectoris    PTCA of RCA in setting of an MI in 1986-87; no follow-up study since available.  . Essential hypertension   . Hyperlipidemia with target LDL less than 70    With known CAD  . Left bundle branch block (LBBB) on electrocardiogram    Noted as far back as 2008.  . 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   Past Surgical History:  Procedure Laterality Date  . EP IMPLANTABLE DEVICE N/A 05/10/2016   Procedure: Loop Recorder Insertion;  Surgeon: Evans Lance, MD;  Location: [facility];  Service: Cardiovascular;  Laterality: N/A;  slight blood drop on dressing  . REPLACEMENT TOTAL KNEE    . TRANSTHORACIC ECHOCARDIOGRAM  03/2016   Normal LV size with mild LV hypertrophy. EF 30%. Diffuse  hypokinesis wtih septal-lateral dyssynchrony. Normal RV size and systolic function. Mild MR and mild AI.     No family history on file.   Social History   Social History  . Marital status: Married    Spouse name: N/A  . Number of children: N/A  . Years of education: N/A   Occupational History  . Not on file.   Social History Main Topics  . Smoking status: Never Smoker  . Smokeless tobacco: Never Used  . Alcohol use No  .  Drug use: No  . Sexual activity: Not on file   Other Topics Concern  . Not on file   Social History Narrative  . No narrative on file     Ht 5\' 11"  (1.803 m)   Wt 170 lb 12.8 oz (77.5 kg)   BMI 23.82 kg/m   Physical Exam:  Well appearing elderly man, NAD HEENT: Unremarkable Neck:  6 cm JVD, no thyromegally Lymphatics:  No adenopathy Back:  No CVA tenderness Lungs:  Clear with no wheezes HEART:  Regular rate rhythm, no murmurs, no rubs, no clicks Abd:  soft, positive bowel sounds, no organomegally, no rebound, no guarding Ext:  2 plus pulses, no edema, no cyanosis, no clubbing Skin:  No rashes no nodules Neuro:  CN II  through XII intact, motor grossly intact  EKG - reviewed, NSR with LBBB  DEVICE  ILR demonstrates PMVT at 300/min, non-sustained but prolonged.   Assess/Plan: 1. Syncope - he has multiple etiologies but yesterday almost passed out with VT at 300/min. I have recommended ICD insertion. 2. Chronic systolic heart failure - his EF is 30% and he has class 2 symptoms and a QRS duration of 170 ms with LBBB. I have recommended he have a biv ICD. 3. ICM - he denies anginal symptoms despite working very vigorously.  4. Sinus node dysfunction - he has had a h/o this in the past but has been minimally symptomatic. He will have an atrial lead.  Gregory Rodgers.D.

## 2017-01-09 NOTE — Interval H&P Note (Signed)
History and Physical Interval Note:  01/09/2017 3:14 PM  [name]  has presented today for surgery, with the diagnosis of vt - cm - left bundle branch block  The various methods of treatment have been discussed with the patient and family. After consideration of risks, benefits and other options for treatment, the patient has consented to  Procedure(s): BIV ICD INSERTION CRT-D (N/A) as a surgical intervention .  The patient's history has been reviewed, patient examined, no change in status, stable for surgery.  I have reviewed the patient's chart and labs.  Questions were answered to the patient's satisfaction.     Gregory Rodgers

## 2017-01-09 NOTE — Discharge Instructions (Signed)
° ° °  Supplemental Discharge Instructions for  Pacemaker/Defibrillator Patients  Activity No heavy lifting or vigorous activity with your left/right arm for 6 to 8 weeks.  Do not raise your left/right arm above your head for one week.  Gradually raise your affected arm as drawn below.             12/13/16                     12/15/16                       12/16/16                    12/17/16 __  NO DRIVING until cleared to at follow up  [place] the wound area clean and dry.  Do not get this area wet for one week. No showers for one week; you may shower on 12/17/16  . - The tape/steri-strips on your wound will fall off; do not pull them off.  No bandage is needed on the site.  DO  NOT apply any creams, oils, or ointments to the wound area. - If you notice any drainage or discharge from the wound, any swelling or bruising at the site, or you develop a fever > 101? F after you are discharged home, call the office at once.  Special Instructions - You are still able to use cellular telephones; use the ear opposite the side where you have your pacemaker/defibrillator.  Avoid carrying your cellular phone near your device. - When traveling through airports, show security personnel your identification card to avoid being screened in the metal detectors.  Ask the security personnel to use the hand wand. - Avoid arc welding equipment, MRI testing (magnetic resonance imaging), TENS units (transcutaneous nerve stimulators).  Call the office for questions about other devices. - Avoid electrical appliances that are in poor condition or are not properly grounded. - Microwave ovens are safe to be near or to operate.  Additional information for defibrillator patients should your device go off: - If your device goes off ONCE and you feel fine afterward, notify the device clinic nurses. - If your device goes off ONCE and you do not feel well afterward, call 911. - If your device goes off TWICE, call  911. - If your device goes off THREE times in one day, call 911.  DO NOT DRIVE YOURSELF OR A FAMILY MEMBER WITH A DEFIBRILLATOR TO THE HOSPITAL--CALL 911.

## 2017-01-09 NOTE — Discharge Summary (Signed)
ELECTROPHYSIOLOGY PROCEDURE DISCHARGE SUMMARY    Patient ID: Gregory Rodgers,  MRN: [id number], DOB/AGE: 1928-02-14 83 y.o.  Admit date: 01/09/2017 Discharge date: 12/10/16  Primary Care Physician: Melony Overly, MD Primary Cardiologist: Dr. Ellyn Hack Electrophysiologist: Dr. Lovena Le  Primary Discharge Diagnosis:  1. ICM 2. NSVT  Secondary Discharge Diagnosis:  1. CAD 2. Chronic systolic CHF 3. HTN 4. LBBB   No Known Allergies   Procedures This Admission:  1.  Implantation of a SJM CRT-D on 01/09/17 by Dr Lovena Le.  The patient received St. Jude (serial # [id number] ) right atrial lead and a St. Jude (serial number [id number]) right ventricular defibrillator lead St. Jude(serial number [id number]) LV lead St. Jude (serial Number [id number]) biventricular ICD DFT's was successful at 15 J.   There were no immediate post procedure complications. 2.  CXR on  demonstrated no pneumothorax status post device implantation.   Brief HPI: Gregory Rodgers is a 83 y.o. male is followed in the outpatient setting for syncope, CM, CHF.  Past medical history is as above.  The patient has persistent LV dysfunction despite guideline directed therapy and symptomatic NSVT noted by ILR.  Risks, benefits, and alternatives to ICD implantation were reviewed with the patient who wished to proceed.   Hospital Course:  The patient was admitted and underwent implantation of a CRT-D with details as outlined above. He was monitored on telemetry overnight which demonstrated SR/Vpacing, occ PVCs.  Left chest was without hematoma or ecchymosis.  The device was interrogated and found to be functioning normally.  CXR was obtained and demonstrated no pneumothorax status post device implantation.  Wound care, arm mobility, and restrictions were reviewed with the patient.  The patient was examined by Dr. Lovena Le and considered stable for discharge to home.   The patient's discharge medications include an ACE-I  (lisinopril) and beta blocker (no BB with sinus node dyfunction).   Physical Exam: Vitals:   01/10/17 0100 01/10/17 0435 01/10/17 0620 01/10/17 0817  BP: (!) 103/56 (!) 135/59 (!) 111/55 (!) 119/58  Pulse: (!) 58 (!) 56 (!) 56   Resp: 19 15 (!) 21   Temp:  98.7 F (37.1 C)  98.7 F (37.1 C)  TempSrc:  Oral  Oral  SpO2:   95%   Weight:  169 lb 15.6 oz (77.1 kg)    Height:        GEN- The patient is well appearing, alert and oriented x 3 today.   HEENT: normocephalic, atraumatic; sclera clear, conjunctiva pink; hearing intact; oropharynx clear Lungs- CTA b/l, normal work of breathing.  No wheezes, rales, rhonchi Heart- RRR, no murmurs, rubs or gallops, PMI not laterally displaced GI- soft, non-tender, non-distended Extremities- no clubbing, cyanosis, or edema MS- no significant deformity or atrophy Skin- warm and dry, no rash or lesion, left chest without hematoma/ecchymosis Psych- euthymic mood, full affect Neuro- no gross defecits  Labs:   Lab Results  Component Value Date   WBC 36.4 (H) 01/09/2017   HGB 10.8 (L) 01/09/2017   HCT 34.4 (L) 01/09/2017   MCV 103.3 (H) 01/09/2017   PLT 140 (L) 01/09/2017     Recent Labs Lab 01/09/17 1507  NA 137  K 4.7  CL 106  CO2 25  BUN 17  CREATININE 0.91  CALCIUM 8.7*  GLUCOSE 93    Discharge Medications:  Allergies as of 01/10/2017   No Known Allergies     Medication List    TAKE these medications   aspirin  81 MG tablet Take 81 mg by mouth daily.   co-enzyme Q-10 30 MG capsule Take 30 mg by mouth daily.   fish oil-omega-3 fatty acids 1000 MG capsule Take 1 g by mouth daily.   ibuprofen 200 MG tablet Commonly known as:  ADVIL,MOTRIN Take 200 mg by mouth every 6 (six) hours as needed for mild pain.   lisinopril 10 MG tablet Commonly known as:  PRINIVIL,ZESTRIL Take 1 tablet (10 mg total) by mouth daily.   MAALOX MAX PO Take 5 mLs by mouth daily as needed (upset stomach).   metoprolol succinate 25 MG 24 hr  tablet Commonly known as:  TOPROL XL Take 1 tablet (25 mg total) by mouth daily.   multivitamin tablet Take 1 tablet by mouth daily.            Discharge Care Instructions        Start     Ordered   01/10/17 0000  metoprolol succinate (TOPROL XL) 25 MG 24 hr tablet  Daily    Question:  Supervising Provider  Answer:  Thompson Grayer   01/10/17 0905   01/10/17 0000  Increase activity slowly     01/10/17 0905   01/10/17 0000  Diet - low sodium heart healthy     01/10/17 0905      Disposition:  home Discharge Instructions    Diet - low sodium heart healthy    Complete by:  As directed    Increase activity slowly    Complete by:  As directed      Follow-up Information    [facility] Follow up on 01/22/2017.   Specialty:  Cardiology Why:  11:30AM, wound check visit Contact information: [address] [place]       Evans Lance, MD Follow up on 04/10/2017.   Specialty:  Cardiology Why:  10:00AM Contact information: [address] [address] [phone]           Duration of Discharge Encounter: Greater than 30 minutes including physician time.  Venetia Night, PA-C 01/10/2017 9:08 AM  EP Attending Patient seen and examined. Agree with above. The patient is stable after biv ICD insertion. His device has been interogated under my direction and is working normally. Usual followup.  Mikle Bosworth.D.

## 2017-01-10 ENCOUNTER — Encounter (HOSPITAL_COMMUNITY): Payer: Self-pay | Admitting: Internal Medicine

## 2017-01-10 ENCOUNTER — Ambulatory Visit (HOSPITAL_COMMUNITY): Payer: PPO

## 2017-01-10 DIAGNOSIS — I252 Old myocardial infarction: Secondary | ICD-10-CM | POA: Diagnosis not present

## 2017-01-10 DIAGNOSIS — I472 Ventricular tachycardia: Secondary | ICD-10-CM | POA: Diagnosis not present

## 2017-01-10 DIAGNOSIS — Z7982 Long term (current) use of aspirin: Secondary | ICD-10-CM | POA: Diagnosis not present

## 2017-01-10 DIAGNOSIS — I255 Ischemic cardiomyopathy: Secondary | ICD-10-CM | POA: Diagnosis not present

## 2017-01-10 DIAGNOSIS — I420 Dilated cardiomyopathy: Secondary | ICD-10-CM | POA: Diagnosis not present

## 2017-01-10 DIAGNOSIS — R7303 Prediabetes: Secondary | ICD-10-CM | POA: Diagnosis not present

## 2017-01-10 DIAGNOSIS — Z95810 Presence of automatic (implantable) cardiac defibrillator: Secondary | ICD-10-CM | POA: Diagnosis not present

## 2017-01-10 DIAGNOSIS — I110 Hypertensive heart disease with heart failure: Secondary | ICD-10-CM | POA: Diagnosis not present

## 2017-01-10 DIAGNOSIS — I5022 Chronic systolic (congestive) heart failure: Secondary | ICD-10-CM | POA: Diagnosis not present

## 2017-01-10 DIAGNOSIS — I447 Left bundle-branch block, unspecified: Secondary | ICD-10-CM | POA: Diagnosis not present

## 2017-01-10 DIAGNOSIS — I2510 Atherosclerotic heart disease of native coronary artery without angina pectoris: Secondary | ICD-10-CM | POA: Diagnosis not present

## 2017-01-10 DIAGNOSIS — Z955 Presence of coronary angioplasty implant and graft: Secondary | ICD-10-CM | POA: Diagnosis not present

## 2017-01-10 DIAGNOSIS — E785 Hyperlipidemia, unspecified: Secondary | ICD-10-CM | POA: Diagnosis not present

## 2017-01-10 LAB — GLUCOSE, CAPILLARY: GLUCOSE-CAPILLARY: 131 mg/dL — AB (ref 65–99)

## 2017-01-10 MED ORDER — YOU HAVE A PACEMAKER BOOK
Freq: Once | Status: AC
  Administered 2017-01-10: 1
  Filled 2017-01-10: qty 1

## 2017-01-10 MED ORDER — METOPROLOL SUCCINATE ER 25 MG PO TB24: 25.0000 mg | ORAL_TABLET | Freq: Every day | ORAL | 6 refills | Status: DC

## 2017-01-10 MED ORDER — OFF THE BEAT BOOK
Freq: Once | Status: AC
  Administered 2017-01-10: 1
  Filled 2017-01-10: qty 1

## 2017-01-10 MED FILL — Heparin Sodium (Porcine) 2 Unit/ML in Sodium Chloride 0.9%: INTRAMUSCULAR | Qty: 500 | Status: AC

## 2017-01-10 NOTE — Care Management Note (Signed)
Case Management Note  Patient Details  Name: Gregory Rodgers MRN: [id number] Date of Birth: January 17, 1928  Subjective/Objective:   From home, s/p biventricular ICD insertion.  For discharge today.                  Action/Plan: DC home today s/p biventricular ICD insertion.  Expected Discharge Date:  01/10/17               Expected Discharge Plan:  Home/Self Care  In-House Referral:     Discharge planning Services  CM Consult  Post Acute Care Choice:    Choice offered to:     DME Arranged:    DME Agency:     HH Arranged:    HH Agency:     Status of Service:  Completed, signed off  If discussed at [employer] of Stay Meetings, dates discussed:    Additional Comments:  Zenon Mayo, RN 01/10/2017, 9:19 AM

## 2017-01-16 DIAGNOSIS — Z23 Encounter for immunization: Secondary | ICD-10-CM | POA: Diagnosis not present

## 2017-01-18 ENCOUNTER — Telehealth: Payer: Self-pay

## 2017-01-18 NOTE — Telephone Encounter (Signed)
Spoke with patients daughter who was asking about activity restrictions for father. Reviewed activity restrictions and confirmed upcoming wound check appt.

## 2017-01-22 ENCOUNTER — Ambulatory Visit (INDEPENDENT_AMBULATORY_CARE_PROVIDER_SITE_OTHER): Payer: PPO | Admitting: *Deleted

## 2017-01-22 DIAGNOSIS — I255 Ischemic cardiomyopathy: Secondary | ICD-10-CM | POA: Diagnosis not present

## 2017-01-22 DIAGNOSIS — I447 Left bundle-branch block, unspecified: Secondary | ICD-10-CM | POA: Diagnosis not present

## 2017-01-23 ENCOUNTER — Telehealth: Payer: Self-pay | Admitting: Cardiology

## 2017-01-23 NOTE — Telephone Encounter (Signed)
Spoke w/ pt wife and requested that he send a manual transmission b/c his home monitor has not updated in at least 14 days.   

## 2017-01-24 LAB — CUP PACEART INCLINIC DEVICE CHECK
Brady Statistic RA Percent Paced: 18 %
HighPow Impedance: 54 Ohm
Implantable Lead Implant Date: 20180926
Implantable Lead Location: 753859
Implantable Lead Location: 753860
Implantable Pulse Generator Implant Date: 20180926
Lead Channel Impedance Value: 437.5 Ohm
Lead Channel Impedance Value: 537.5 Ohm
Lead Channel Pacing Threshold Amplitude: 1 V
Lead Channel Pacing Threshold Amplitude: 2.25 V
Lead Channel Pacing Threshold Pulse Width: 0.5 ms
Lead Channel Pacing Threshold Pulse Width: 0.5 ms
Lead Channel Pacing Threshold Pulse Width: 0.5 ms
Lead Channel Setting Sensing Sensitivity: 0.5 mV
MDC IDC LEAD IMPLANT DT: 20180926
MDC IDC LEAD IMPLANT DT: 20180926
MDC IDC LEAD LOCATION: 753858
MDC IDC MSMT BATTERY REMAINING LONGEVITY: 46 mo
MDC IDC MSMT LEADCHNL LV PACING THRESHOLD AMPLITUDE: 2.25 V
MDC IDC MSMT LEADCHNL LV PACING THRESHOLD PULSEWIDTH: 1 ms
MDC IDC MSMT LEADCHNL LV PACING THRESHOLD PULSEWIDTH: 1 ms
MDC IDC MSMT LEADCHNL RA PACING THRESHOLD AMPLITUDE: 1 V
MDC IDC MSMT LEADCHNL RA PACING THRESHOLD PULSEWIDTH: 0.5 ms
MDC IDC MSMT LEADCHNL RA SENSING INTR AMPL: 2.4 mV
MDC IDC MSMT LEADCHNL RV IMPEDANCE VALUE: 437.5 Ohm
MDC IDC MSMT LEADCHNL RV PACING THRESHOLD AMPLITUDE: 0.5 V
MDC IDC MSMT LEADCHNL RV PACING THRESHOLD AMPLITUDE: 0.5 V
MDC IDC MSMT LEADCHNL RV SENSING INTR AMPL: 9.5 mV
MDC IDC SESS DTM: 20181009154742
MDC IDC SET LEADCHNL LV PACING AMPLITUDE: 3.5 V
MDC IDC SET LEADCHNL LV PACING PULSEWIDTH: 1 ms
MDC IDC SET LEADCHNL RA PACING AMPLITUDE: 3.5 V
MDC IDC SET LEADCHNL RV PACING AMPLITUDE: 3.5 V
MDC IDC SET LEADCHNL RV PACING PULSEWIDTH: 0.5 ms
MDC IDC STAT BRADY RV PERCENT PACED: 99.22 %
Pulse Gen Serial Number: 9764296

## 2017-01-24 NOTE — Progress Notes (Signed)
Wound check appointment. Steri-strips removed. Wound without redness or edema. Incision edges approximated, wound well healed. Normal device function. Thresholds, sensing, and impedances consistent with implant measurements. Increase in LV threshold noted, now 2.25V@ 1.97ms (was 1.0V@0 .26ms on 9/27), cxr was not ordered. Device programmed at 3.5V for extra safety margin until 3 month visit. LV pulse width increased to 1.47ms. Histogram distribution appropriate for patient and level of activity. (1) AT episode x 8sec. No ventricular arrhythmias noted. Patient educated about wound care, arm mobility, lifting restrictions, shock plan. ROV in 3 months with GT.

## 2017-02-11 ENCOUNTER — Telehealth: Payer: Self-pay | Admitting: Cardiology

## 2017-02-11 DIAGNOSIS — H353132 Nonexudative age-related macular degeneration, bilateral, intermediate dry stage: Secondary | ICD-10-CM | POA: Diagnosis not present

## 2017-02-11 NOTE — Telephone Encounter (Signed)
Remote transmission reviewed. Presenting rhythm: AsBp. (7) AT episodes, max dur. 6sec, last 02/09/17. Normal device function. No alerts.  Spoke to daughter about results of remote transmission. Informed her that patient has not had any alerts recorded on his device. Daughter verbalized understanding and appreciation of information.

## 2017-02-11 NOTE — Telephone Encounter (Signed)
Patient wife called and stated that patient felt a vibration from his device. Instructed pt wife to send a manual transmission w/ his home monitor. Informed her that once the transmission is received a Chief Operating Officer will review and call back with the results. Pt wife verbalized understanding.

## 2017-02-27 ENCOUNTER — Telehealth: Payer: Self-pay | Admitting: Internal Medicine

## 2017-02-27 ENCOUNTER — Ambulatory Visit (INDEPENDENT_AMBULATORY_CARE_PROVIDER_SITE_OTHER): Payer: Self-pay | Admitting: *Deleted

## 2017-02-27 DIAGNOSIS — I255 Ischemic cardiomyopathy: Secondary | ICD-10-CM

## 2017-02-27 NOTE — Patient Instructions (Addendum)
Clean incision site 2 times a day with a new clean cloth with soap and water, pat dry. Call [phone] if you develop chills or fever. Return to office on 03/06/2017 at 9:30am for wound re-check.

## 2017-02-27 NOTE — Progress Notes (Signed)
Wound check~ stitch removed from middle incision site. Right corner of incision site reddened, site assessed by Dr. Lovena Le and superficially debrided. Dr. Lovena Le recommended that pt wash with a clean wash cloth and warm soapy water 2x daily, pat dry. ROV w/ DC 03/06/17 for wound re-check.

## 2017-02-27 NOTE — Telephone Encounter (Signed)
Gregory Rodgers is calling because they are having some issues with the incision where the pacemaker/defibilator was placed . Stating that 3 pimples just came up around that area . Would Like to speak with a nurse . When calling back ask to speak with Lenor Derrick (daughter) at [phone]. Thanks

## 2017-02-27 NOTE — Telephone Encounter (Signed)
Ms. Gregory Rodgers called back and stated that they would come in at 2:00pm today to [name] for incision to be evaluated.

## 2017-02-27 NOTE — Telephone Encounter (Signed)
Spoke with Ms. Gregory Rodgers she stated that there were 3 small "light pink pimples"along the outer corners and middle on incision line, she stated that the incision itself was not red, the site was not swollen and pt had not been running a fever. Offered a device clinic apt. She stated that she would talk to pt because they live an hour away and call back.

## 2017-02-28 ENCOUNTER — Ambulatory Visit: Payer: PPO

## 2017-03-06 ENCOUNTER — Ambulatory Visit (INDEPENDENT_AMBULATORY_CARE_PROVIDER_SITE_OTHER): Payer: Self-pay | Admitting: *Deleted

## 2017-03-06 DIAGNOSIS — I472 Ventricular tachycardia: Secondary | ICD-10-CM

## 2017-03-06 DIAGNOSIS — I4729 Other ventricular tachycardia: Secondary | ICD-10-CM

## 2017-03-06 DIAGNOSIS — I255 Ischemic cardiomyopathy: Secondary | ICD-10-CM

## 2017-03-06 DIAGNOSIS — Z95810 Presence of automatic (implantable) cardiac defibrillator: Secondary | ICD-10-CM

## 2017-03-06 NOTE — Progress Notes (Signed)
Wound recheck in clinic.  Device not interrogated.  Superficial flaking scab removed from right corner of incision for better visualization of wound; no unapproximated area noted but antibiotic ointment and bandaid applied.  Patient will plan to leave OTA after next shower.  No drainage or redness noted, no fever or chills reported by patient.  Incision well healed.  Patient instructed to continue washing site with soap and water and to continue monitoring for signs/symptoms of infection.  Patient and daughter verbalize understanding of instructions and deny questions or concerns at this time.  ROV with GT on 04/10/17 as scheduled.

## 2017-03-11 ENCOUNTER — Other Ambulatory Visit: Payer: Self-pay | Admitting: Cardiology

## 2017-03-11 DIAGNOSIS — C9110 Chronic lymphocytic leukemia of B-cell type not having achieved remission: Secondary | ICD-10-CM

## 2017-03-12 NOTE — Telephone Encounter (Signed)
This medication was prescribed by Dr Ellyn Hack. The pharmacy sent the refill request to his office but they routed the request to this office. Patient has seen Dr Lovena Le. Okay to refill or should this be deferred back to Dr Allison Quarry office? Please advise. Thanks, MI

## 2017-03-14 NOTE — Telephone Encounter (Signed)
Rx(s) sent to pharmacy electronically.  

## 2017-03-14 NOTE — Telephone Encounter (Signed)
Mindy, Dr. Ellyn Hack is this patient's primary cardiologist so he should be signing his medication refills. Thank you for catching that. Sonia Baller

## 2017-03-20 DIAGNOSIS — Z1331 Encounter for screening for depression: Secondary | ICD-10-CM | POA: Diagnosis not present

## 2017-03-20 DIAGNOSIS — E559 Vitamin D deficiency, unspecified: Secondary | ICD-10-CM | POA: Diagnosis not present

## 2017-03-20 DIAGNOSIS — N182 Chronic kidney disease, stage 2 (mild): Secondary | ICD-10-CM | POA: Diagnosis not present

## 2017-03-20 DIAGNOSIS — R7303 Prediabetes: Secondary | ICD-10-CM | POA: Diagnosis not present

## 2017-03-20 DIAGNOSIS — Z79899 Other long term (current) drug therapy: Secondary | ICD-10-CM | POA: Diagnosis not present

## 2017-03-20 DIAGNOSIS — Z125 Encounter for screening for malignant neoplasm of prostate: Secondary | ICD-10-CM | POA: Diagnosis not present

## 2017-03-20 DIAGNOSIS — Z0000 Encounter for general adult medical examination without abnormal findings: Secondary | ICD-10-CM | POA: Diagnosis not present

## 2017-03-20 DIAGNOSIS — I1310 Hypertensive heart and chronic kidney disease without heart failure, with stage 1 through stage 4 chronic kidney disease, or unspecified chronic kidney disease: Secondary | ICD-10-CM | POA: Diagnosis not present

## 2017-03-20 DIAGNOSIS — E785 Hyperlipidemia, unspecified: Secondary | ICD-10-CM | POA: Diagnosis not present

## 2017-03-20 DIAGNOSIS — Z1211 Encounter for screening for malignant neoplasm of colon: Secondary | ICD-10-CM | POA: Diagnosis not present

## 2017-03-20 DIAGNOSIS — Z9181 History of falling: Secondary | ICD-10-CM | POA: Diagnosis not present

## 2017-04-03 ENCOUNTER — Telehealth: Payer: Self-pay

## 2017-04-03 NOTE — Telephone Encounter (Signed)
Called and ask them to fax CBC to office. Fax number given.

## 2017-04-03 NOTE — Telephone Encounter (Signed)
Daughter called and left message.  PCP faxed lab work over per daughter, WBC was around 40,000. Asking what they should do, next appt with Dr. Alvy Bimler is June 2019.

## 2017-04-03 NOTE — Telephone Encounter (Signed)
Called per Dr. Alvy Bimler and left message. Most recent CBC results faxed from PCP, no needed treatment or earlier appt. Minimal changed compared to 9/26 labs. Instructed to call for questions.

## 2017-04-03 NOTE — Telephone Encounter (Signed)
Please get the copy on my desk for review

## 2017-04-04 DIAGNOSIS — I10 Essential (primary) hypertension: Secondary | ICD-10-CM | POA: Diagnosis not present

## 2017-04-04 DIAGNOSIS — Z1331 Encounter for screening for depression: Secondary | ICD-10-CM | POA: Diagnosis not present

## 2017-04-04 DIAGNOSIS — E663 Overweight: Secondary | ICD-10-CM | POA: Diagnosis not present

## 2017-04-04 DIAGNOSIS — Z9181 History of falling: Secondary | ICD-10-CM | POA: Diagnosis not present

## 2017-04-04 DIAGNOSIS — Z0000 Encounter for general adult medical examination without abnormal findings: Secondary | ICD-10-CM | POA: Diagnosis not present

## 2017-04-04 DIAGNOSIS — Z125 Encounter for screening for malignant neoplasm of prostate: Secondary | ICD-10-CM | POA: Diagnosis not present

## 2017-04-04 DIAGNOSIS — E785 Hyperlipidemia, unspecified: Secondary | ICD-10-CM | POA: Diagnosis not present

## 2017-04-10 ENCOUNTER — Ambulatory Visit: Payer: PPO | Admitting: Internal Medicine

## 2017-04-10 ENCOUNTER — Encounter: Payer: Self-pay | Admitting: Internal Medicine

## 2017-04-10 VITALS — BP 132/64 | HR 54 | Ht 71.0 in | Wt 176.8 lb

## 2017-04-10 DIAGNOSIS — Z95810 Presence of automatic (implantable) cardiac defibrillator: Secondary | ICD-10-CM | POA: Diagnosis not present

## 2017-04-10 DIAGNOSIS — I472 Ventricular tachycardia: Secondary | ICD-10-CM | POA: Diagnosis not present

## 2017-04-10 DIAGNOSIS — I255 Ischemic cardiomyopathy: Secondary | ICD-10-CM

## 2017-04-10 DIAGNOSIS — I4729 Other ventricular tachycardia: Secondary | ICD-10-CM

## 2017-04-10 NOTE — Progress Notes (Signed)
HPI Gregory Rodgers returns today for ongoing evaluation of VT, chronic systolic heart failure, LBBB, s/p biv ICD insertion. The patient has done well in the interim. No chest pain or sob. He no trouble healing up from his device insertion.  No Known Allergies   Current Outpatient Medications  Medication Sig Dispense Refill  . aspirin 81 MG tablet Take 81 mg by mouth daily.    . Calcium Carbonate-Simethicone (MAALOX MAX PO) Take 5 mLs by mouth daily as needed (upset stomach).    . co-enzyme Q-10 30 MG capsule Take 30 mg by mouth daily.    . fish oil-omega-3 fatty acids 1000 MG capsule Take 1 g by mouth daily.     Marland Kitchen ibuprofen (ADVIL,MOTRIN) 200 MG tablet Take 200 mg by mouth every 6 (six) hours as needed for mild pain.    Marland Kitchen lisinopril (PRINIVIL,ZESTRIL) 10 MG tablet Take 1 tablet (10 mg total) by mouth daily. PLEASE CONTACT OFFICE FOR ADDITIONAL REFILLS 90 tablet 0  . metoprolol succinate (TOPROL XL) 25 MG 24 hr tablet Take 1 tablet (25 mg total) by mouth daily. 30 tablet 6  . Multiple Vitamin (MULTIVITAMIN) tablet Take 1 tablet by mouth daily.     No current facility-administered medications for this visit.      Past Medical History:  Diagnosis Date  . Basal cell carcinoma    "numerous places"  . Borderline diabetes   . Cardiomyopathy, dilated ([place]) 03/2016   Unclear etiology  . CLL (chronic lymphocytic leukemia) ([place]) 09/2009   stage 0; on observation  . Coronary artery disease involving native heart without angina pectoris    PTCA of RCA in setting of an MI in 1986-87; no follow-up study since available.  . Essential hypertension   . History of kidney stones   . Hyperlipidemia with target LDL less than 70    With known CAD  . Left bundle branch block (LBBB) on electrocardiogram    Noted as far back as 2008.  . Old inferior wall myocardial infarction 1986/87   Had PTCA most likely of the RCA    ROS:   All systems reviewed and negative except as noted in the  HPI.   Past Surgical History:  Procedure Laterality Date  . BASAL CELL CARCINOMA EXCISION     "numerous"  . BIV ICD INSERTION CRT-D N/A 01/09/2017   Procedure: BIV ICD INSERTION CRT-D;  Surgeon: Evans Lance, MD;  Location: [facility];  Service: Cardiovascular;  Laterality: N/A;  . CATARACT EXTRACTION W/ INTRAOCULAR LENS  IMPLANT, BILATERAL Bilateral   . CORONARY ANGIOPLASTY  1986/1987  . EP IMPLANTABLE DEVICE N/A 05/10/2016   Procedure: Loop Recorder Insertion;  Surgeon: Evans Lance, MD;  Location: [facility];  Service: Cardiovascular;  Laterality: N/A;  slight blood drop on dressing  . JOINT REPLACEMENT    . MOHS SURGERY Left 01/02/2017   ear  . REPLACEMENT TOTAL KNEE Left   . TRANSTHORACIC ECHOCARDIOGRAM  03/2016   Normal LV size with mild LV hypertrophy. EF 30%. Diffuse  hypokinesis wtih septal-lateral dyssynchrony. Normal RV size and systolic function. Mild Gregory and mild AI.     No family history on file.   Social History   Socioeconomic History  . Marital status: Married    Spouse name: Not on file  . Number of children: Not on file  . Years of education: Not on file  . Highest education level: Not on file  Social Needs  .  Financial resource strain: Not on file  . Food insecurity - worry: Not on file  . Food insecurity - inability: Not on file  . Transportation needs - medical: Not on file  . Transportation needs - non-medical: Not on file  Occupational History  . Not on file  Tobacco Use  . Smoking status: Never Smoker  . Smokeless tobacco: Never Used  Substance and Sexual Activity  . Alcohol use: No  . Drug use: No  . Sexual activity: Not on file  Other Topics Concern  . Not on file  Social History Narrative  . Not on file     BP 132/64   Pulse (!) 54   Ht 5\' 11"  (1.803 m)   Wt 176 lb 12.8 oz (80.2 kg)   SpO2 98%   BMI 24.66 kg/m   Physical Exam:  Well appearing 83 yo man, NAD HEENT: Unremarkable Neck:  6 cm JVD, no  thyromegally Lymphatics:  No adenopathy Back:  No CVA tenderness Lungs:  Clear with no wheezes. Well healed ICD incision HEART:  Regular rate rhythm, no murmurs, no rubs, no clicks Abd:  soft, positive bowel sounds, no organomegally, no rebound, no guarding Ext:  2 plus pulses, no edema, no cyanosis, no clubbing Skin:  No rashes no nodules Neuro:  CN II through XII intact, motor grossly intact  EKG - nsr with BiV Pacing  DEVICE  Normal device function.  See PaceArt for details.   Assess/Plan: 1. ICD - his St. Jude device is working normally. Will recheck in several months. 2. Chronic systolic heart failure - his symptoms are class 2. He will continue his current meds. 3. Syncope - he has had no additional episodes. He will undergo watchful waiting.   Gregory Rodgers.D.

## 2017-04-10 NOTE — Patient Instructions (Addendum)
Medication Instructions:  Your physician recommends that you continue on your current medications as directed. Please refer to the Current Medication list given to you today.  Labwork: None ordered.  Testing/Procedures: None ordered.  Follow-Up: Your physician wants you to follow-up in: 9 months with Dr. Lovena Le.   You will receive a reminder letter in the mail two months in advance. If you don't receive a letter, please call our office to schedule the follow-up appointment.  Remote monitoring is used to monitor your ICD from home. This monitoring reduces the number of office visits required to check your device to one time per year. It allows [place] to keep an eye on the functioning of your device to ensure it is working properly. You are scheduled for a device check from home on 07/10/2017. You may send your transmission at any time that day. If you have a wireless device, the transmission will be sent automatically. After your physician reviews your transmission, you will receive a postcard with your next transmission date.  Any Other Special Instructions Will Be Listed Below (If Applicable).  If you need a refill on your cardiac medications before your next appointment, please call your pharmacy.

## 2017-04-11 LAB — CUP PACEART INCLINIC DEVICE CHECK
Brady Statistic RA Percent Paced: 11 %
Brady Statistic RV Percent Paced: 99 %
HighPow Impedance: 59.625
Implantable Lead Implant Date: 20180926
Implantable Lead Implant Date: 20180926
Implantable Lead Location: 753858
Implantable Lead Model: 7122
Lead Channel Impedance Value: 475 Ohm
Lead Channel Pacing Threshold Amplitude: 0.5 V
Lead Channel Pacing Threshold Amplitude: 1.25 V
Lead Channel Pacing Threshold Amplitude: 1.25 V
Lead Channel Pacing Threshold Pulse Width: 0.5 ms
Lead Channel Sensing Intrinsic Amplitude: 11.7 mV
Lead Channel Setting Pacing Amplitude: 2.5 V
Lead Channel Setting Pacing Amplitude: 2.5 V
Lead Channel Setting Pacing Pulse Width: 0.5 ms
Lead Channel Setting Pacing Pulse Width: 0.8 ms
MDC IDC LEAD IMPLANT DT: 20180926
MDC IDC LEAD LOCATION: 753859
MDC IDC LEAD LOCATION: 753860
MDC IDC MSMT BATTERY REMAINING LONGEVITY: 74 mo
MDC IDC MSMT LEADCHNL LV IMPEDANCE VALUE: 675 Ohm
MDC IDC MSMT LEADCHNL LV PACING THRESHOLD PULSEWIDTH: 0.8 ms
MDC IDC MSMT LEADCHNL LV PACING THRESHOLD PULSEWIDTH: 0.8 ms
MDC IDC MSMT LEADCHNL RA PACING THRESHOLD AMPLITUDE: 0.75 V
MDC IDC MSMT LEADCHNL RA PACING THRESHOLD PULSEWIDTH: 0.5 ms
MDC IDC MSMT LEADCHNL RA SENSING INTR AMPL: 3 mV
MDC IDC MSMT LEADCHNL RV IMPEDANCE VALUE: 437.5 Ohm
MDC IDC PG IMPLANT DT: 20180926
MDC IDC SESS DTM: 20181226161835
MDC IDC SET LEADCHNL RA PACING AMPLITUDE: 2 V
MDC IDC SET LEADCHNL RV SENSING SENSITIVITY: 0.5 mV
Pulse Gen Serial Number: 9764296

## 2017-05-01 DIAGNOSIS — I10 Essential (primary) hypertension: Secondary | ICD-10-CM | POA: Diagnosis not present

## 2017-05-01 DIAGNOSIS — H903 Sensorineural hearing loss, bilateral: Secondary | ICD-10-CM | POA: Diagnosis not present

## 2017-05-06 DIAGNOSIS — H903 Sensorineural hearing loss, bilateral: Secondary | ICD-10-CM | POA: Diagnosis not present

## 2017-05-14 DIAGNOSIS — H903 Sensorineural hearing loss, bilateral: Secondary | ICD-10-CM | POA: Diagnosis not present

## 2017-05-17 DIAGNOSIS — I63139 Cerebral infarction due to embolism of unspecified carotid artery: Secondary | ICD-10-CM

## 2017-05-17 HISTORY — DX: Cerebral infarction due to embolism of unspecified carotid artery: I63.139

## 2017-05-31 DIAGNOSIS — I10 Essential (primary) hypertension: Secondary | ICD-10-CM | POA: Diagnosis not present

## 2017-05-31 DIAGNOSIS — R404 Transient alteration of awareness: Secondary | ICD-10-CM | POA: Diagnosis not present

## 2017-05-31 DIAGNOSIS — G459 Transient cerebral ischemic attack, unspecified: Secondary | ICD-10-CM | POA: Diagnosis not present

## 2017-05-31 DIAGNOSIS — Z950 Presence of cardiac pacemaker: Secondary | ICD-10-CM | POA: Diagnosis not present

## 2017-05-31 DIAGNOSIS — R410 Disorientation, unspecified: Secondary | ICD-10-CM | POA: Diagnosis not present

## 2017-05-31 DIAGNOSIS — R531 Weakness: Secondary | ICD-10-CM | POA: Diagnosis not present

## 2017-05-31 DIAGNOSIS — Z87891 Personal history of nicotine dependence: Secondary | ICD-10-CM | POA: Diagnosis not present

## 2017-06-01 ENCOUNTER — Other Ambulatory Visit: Payer: Self-pay

## 2017-06-01 ENCOUNTER — Observation Stay (HOSPITAL_BASED_OUTPATIENT_CLINIC_OR_DEPARTMENT_OTHER): Payer: PPO

## 2017-06-01 ENCOUNTER — Observation Stay (HOSPITAL_COMMUNITY): Payer: PPO

## 2017-06-01 ENCOUNTER — Encounter (HOSPITAL_COMMUNITY): Payer: Self-pay | Admitting: Family Medicine

## 2017-06-01 ENCOUNTER — Inpatient Hospital Stay (HOSPITAL_COMMUNITY)
Admission: AD | Admit: 2017-06-01 | Discharge: 2017-06-04 | DRG: 064 | Disposition: A | Discharge status: Home Health | Payer: PPO | Source: Other Acute Inpatient Hospital | Attending: Internal Medicine | Admitting: Internal Medicine

## 2017-06-01 DIAGNOSIS — I340 Nonrheumatic mitral (valve) insufficiency: Secondary | ICD-10-CM

## 2017-06-01 DIAGNOSIS — Z95810 Presence of automatic (implantable) cardiac defibrillator: Secondary | ICD-10-CM

## 2017-06-01 DIAGNOSIS — Z961 Presence of intraocular lens: Secondary | ICD-10-CM | POA: Diagnosis present

## 2017-06-01 DIAGNOSIS — I6340 Cerebral infarction due to embolism of unspecified cerebral artery: Secondary | ICD-10-CM

## 2017-06-01 DIAGNOSIS — D72820 Lymphocytosis (symptomatic): Secondary | ICD-10-CM | POA: Diagnosis not present

## 2017-06-01 DIAGNOSIS — I5042 Chronic combined systolic (congestive) and diastolic (congestive) heart failure: Secondary | ICD-10-CM | POA: Diagnosis present

## 2017-06-01 DIAGNOSIS — I252 Old myocardial infarction: Secondary | ICD-10-CM | POA: Diagnosis not present

## 2017-06-01 DIAGNOSIS — I63412 Cerebral infarction due to embolism of left middle cerebral artery: Principal | ICD-10-CM | POA: Diagnosis present

## 2017-06-01 DIAGNOSIS — Z85828 Personal history of other malignant neoplasm of skin: Secondary | ICD-10-CM | POA: Diagnosis not present

## 2017-06-01 DIAGNOSIS — I639 Cerebral infarction, unspecified: Secondary | ICD-10-CM | POA: Diagnosis not present

## 2017-06-01 DIAGNOSIS — R479 Unspecified speech disturbances: Secondary | ICD-10-CM | POA: Diagnosis not present

## 2017-06-01 DIAGNOSIS — Q211 Atrial septal defect: Secondary | ICD-10-CM

## 2017-06-01 DIAGNOSIS — C9190 Lymphoid leukemia, unspecified not having achieved remission: Secondary | ICD-10-CM

## 2017-06-01 DIAGNOSIS — Z9842 Cataract extraction status, left eye: Secondary | ICD-10-CM

## 2017-06-01 DIAGNOSIS — I255 Ischemic cardiomyopathy: Secondary | ICD-10-CM | POA: Diagnosis present

## 2017-06-01 DIAGNOSIS — Z7982 Long term (current) use of aspirin: Secondary | ICD-10-CM | POA: Diagnosis not present

## 2017-06-01 DIAGNOSIS — Z9841 Cataract extraction status, right eye: Secondary | ICD-10-CM | POA: Diagnosis not present

## 2017-06-01 DIAGNOSIS — H53123 Transient visual loss, bilateral: Secondary | ICD-10-CM | POA: Diagnosis present

## 2017-06-01 DIAGNOSIS — H53121 Transient visual loss, right eye: Secondary | ICD-10-CM | POA: Diagnosis not present

## 2017-06-01 DIAGNOSIS — I110 Hypertensive heart disease with heart failure: Secondary | ICD-10-CM | POA: Diagnosis present

## 2017-06-01 DIAGNOSIS — I6522 Occlusion and stenosis of left carotid artery: Secondary | ICD-10-CM | POA: Diagnosis not present

## 2017-06-01 DIAGNOSIS — D72829 Elevated white blood cell count, unspecified: Secondary | ICD-10-CM | POA: Diagnosis not present

## 2017-06-01 DIAGNOSIS — C9110 Chronic lymphocytic leukemia of B-cell type not having achieved remission: Secondary | ICD-10-CM | POA: Diagnosis not present

## 2017-06-01 DIAGNOSIS — I351 Nonrheumatic aortic (valve) insufficiency: Secondary | ICD-10-CM

## 2017-06-01 DIAGNOSIS — I63233 Cerebral infarction due to unspecified occlusion or stenosis of bilateral carotid arteries: Secondary | ICD-10-CM | POA: Diagnosis not present

## 2017-06-01 DIAGNOSIS — I6389 Other cerebral infarction: Secondary | ICD-10-CM | POA: Diagnosis not present

## 2017-06-01 DIAGNOSIS — Z9861 Coronary angioplasty status: Secondary | ICD-10-CM | POA: Diagnosis not present

## 2017-06-01 DIAGNOSIS — I712 Thoracic aortic aneurysm, without rupture: Secondary | ICD-10-CM | POA: Diagnosis present

## 2017-06-01 DIAGNOSIS — I2510 Atherosclerotic heart disease of native coronary artery without angina pectoris: Secondary | ICD-10-CM

## 2017-06-01 DIAGNOSIS — R4781 Slurred speech: Secondary | ICD-10-CM | POA: Diagnosis present

## 2017-06-01 DIAGNOSIS — Z87442 Personal history of urinary calculi: Secondary | ICD-10-CM | POA: Diagnosis not present

## 2017-06-01 DIAGNOSIS — E785 Hyperlipidemia, unspecified: Secondary | ICD-10-CM | POA: Diagnosis present

## 2017-06-01 DIAGNOSIS — D509 Iron deficiency anemia, unspecified: Secondary | ICD-10-CM | POA: Diagnosis present

## 2017-06-01 DIAGNOSIS — Z96652 Presence of left artificial knee joint: Secondary | ICD-10-CM | POA: Diagnosis present

## 2017-06-01 DIAGNOSIS — I420 Dilated cardiomyopathy: Secondary | ICD-10-CM | POA: Diagnosis present

## 2017-06-01 DIAGNOSIS — R29701 NIHSS score 1: Secondary | ICD-10-CM | POA: Diagnosis not present

## 2017-06-01 DIAGNOSIS — Z79899 Other long term (current) drug therapy: Secondary | ICD-10-CM

## 2017-06-01 DIAGNOSIS — I10 Essential (primary) hypertension: Secondary | ICD-10-CM

## 2017-06-01 DIAGNOSIS — I63032 Cerebral infarction due to thrombosis of left carotid artery: Secondary | ICD-10-CM | POA: Diagnosis not present

## 2017-06-01 LAB — CBC WITH DIFFERENTIAL/PLATELET
BASOS PCT: 0 %
Basophils Absolute: 0 10*3/uL (ref 0.0–0.1)
EOS ABS: 0 10*3/uL (ref 0.0–0.7)
EOS PCT: 0 %
HEMATOCRIT: 32.5 % — AB (ref 39.0–52.0)
Hemoglobin: 10.4 g/dL — ABNORMAL LOW (ref 13.0–17.0)
LYMPHS PCT: 92 %
Lymphs Abs: 37.7 10*3/uL — ABNORMAL HIGH (ref 0.7–4.0)
MCH: 33.5 pg (ref 26.0–34.0)
MCHC: 32 g/dL (ref 30.0–36.0)
MCV: 104.8 fL — ABNORMAL HIGH (ref 78.0–100.0)
MONOS PCT: 3 %
Monocytes Absolute: 1.2 10*3/uL — ABNORMAL HIGH (ref 0.1–1.0)
NEUTROS PCT: 5 %
Neutro Abs: 2 10*3/uL (ref 1.7–7.7)
Platelets: 142 10*3/uL — ABNORMAL LOW (ref 150–400)
RBC: 3.1 MIL/uL — ABNORMAL LOW (ref 4.22–5.81)
RDW: 14 % (ref 11.5–15.5)
WBC: 40.9 10*3/uL — ABNORMAL HIGH (ref 4.0–10.5)

## 2017-06-01 LAB — COMPREHENSIVE METABOLIC PANEL
ALK PHOS: 59 U/L (ref 38–126)
ALT: 12 U/L — ABNORMAL LOW (ref 17–63)
AST: 18 U/L (ref 15–41)
Albumin: 3.2 g/dL — ABNORMAL LOW (ref 3.5–5.0)
Anion gap: 9 (ref 5–15)
BUN: 20 mg/dL (ref 6–20)
CALCIUM: 8.4 mg/dL — AB (ref 8.9–10.3)
CO2: 22 mmol/L (ref 22–32)
Chloride: 109 mmol/L (ref 101–111)
Creatinine, Ser: 1.03 mg/dL (ref 0.61–1.24)
GFR calc non Af Amer: >60 mL/min (ref >60)
GLUCOSE: 106 mg/dL — AB (ref 65–99)
Potassium: 4.1 mmol/L (ref 3.5–5.1)
SODIUM: 140 mmol/L (ref 135–145)
Total Bilirubin: 0.7 mg/dL (ref 0.3–1.2)
Total Protein: 4.9 g/dL — ABNORMAL LOW (ref 6.5–8.1)

## 2017-06-01 LAB — URINALYSIS, ROUTINE W REFLEX MICROSCOPIC
BACTERIA UA: NONE SEEN
BILIRUBIN URINE: NEGATIVE
Glucose, UA: NEGATIVE mg/dL
KETONES UR: NEGATIVE mg/dL
LEUKOCYTES UA: NEGATIVE
NITRITE: NEGATIVE
Protein, ur: NEGATIVE mg/dL
Specific Gravity, Urine: 1.008 (ref 1.005–1.030)
Squamous Epithelial / LPF: NONE SEEN
pH: 7 (ref 5.0–8.0)

## 2017-06-01 LAB — HEMOGLOBIN A1C
Hgb A1c MFr Bld: 6 % — ABNORMAL HIGH (ref 4.8–5.6)
Mean Plasma Glucose: 125.5 mg/dL

## 2017-06-01 LAB — ECHOCARDIOGRAM COMPLETE
HEIGHTINCHES: 72 in
WEIGHTICAEL: 2843.05 [oz_av]

## 2017-06-01 LAB — LIPID PANEL
CHOL/HDL RATIO: 3.6 ratio
CHOLESTEROL: 134 mg/dL (ref 0–200)
HDL: 37 mg/dL — ABNORMAL LOW (ref >40)
LDL Cholesterol: 81 mg/dL (ref 0–99)
TRIGLYCERIDES: 80 mg/dL (ref <150)
VLDL: 16 mg/dL (ref 0–40)

## 2017-06-01 LAB — MRSA PCR SCREENING: MRSA by PCR: NEGATIVE

## 2017-06-01 LAB — VITAMIN B12: Vitamin B-12: 369 pg/mL (ref 180–914)

## 2017-06-01 LAB — PROTIME-INR
INR: 1.19
Prothrombin Time: 15 seconds (ref 11.4–15.2)

## 2017-06-01 LAB — GLUCOSE, CAPILLARY: GLUCOSE-CAPILLARY: 97 mg/dL (ref 65–99)

## 2017-06-01 LAB — HIV ANTIBODY (ROUTINE TESTING W REFLEX): HIV Screen 4th Generation wRfx: NONREACTIVE

## 2017-06-01 LAB — AMMONIA: Ammonia: 31 umol/L (ref 9–35)

## 2017-06-01 LAB — TSH: TSH: 2.095 u[IU]/mL (ref 0.350–4.500)

## 2017-06-01 LAB — HEPARIN LEVEL (UNFRACTIONATED): Heparin Unfractionated: 0.19 IU/mL — ABNORMAL LOW (ref 0.30–0.70)

## 2017-06-01 LAB — RPR: RPR: NONREACTIVE

## 2017-06-01 MED ORDER — ALUM & MAG HYDROXIDE-SIMETH 200-200-20 MG/5ML PO SUSP: 30.0000 mL | Freq: Four times a day (QID) | ORAL | Status: DC | PRN

## 2017-06-01 MED ORDER — SODIUM CHLORIDE 0.9 % IV SOLN
INTRAVENOUS | Status: AC
  Administered 2017-06-01: 75 mL via INTRAVENOUS

## 2017-06-01 MED ORDER — ADULT MULTIVITAMIN W/MINERALS CH
1.0000 | ORAL_TABLET | Freq: Every day | ORAL | Status: DC
  Administered 2017-06-01 – 2017-06-04 (×4): 1 via ORAL
  Filled 2017-06-01 (×4): qty 1

## 2017-06-01 MED ORDER — HEPARIN (PORCINE) IN NACL 100-0.45 UNIT/ML-% IJ SOLN
900.0000 [IU]/h | INTRAMUSCULAR | Status: DC
  Administered 2017-06-01 – 2017-06-03 (×2): 1050 [IU]/h via INTRAVENOUS
  Filled 2017-06-01 (×3): qty 250

## 2017-06-01 MED ORDER — STROKE: EARLY STAGES OF RECOVERY BOOK
Freq: Once | Status: AC
  Administered 2017-06-01: 04:00:00

## 2017-06-01 MED ORDER — ACETAMINOPHEN 325 MG PO TABS
650.0000 mg | ORAL_TABLET | ORAL | Status: DC | PRN
  Filled 2017-06-01: qty 2

## 2017-06-01 MED ORDER — GADOBENATE DIMEGLUMINE 529 MG/ML IV SOLN
15.0000 mL | Freq: Once | INTRAVENOUS | Status: AC
  Administered 2017-06-01: 15 mL via INTRAVENOUS

## 2017-06-01 MED ORDER — ATORVASTATIN CALCIUM 40 MG PO TABS
40.0000 mg | ORAL_TABLET | Freq: Every day | ORAL | Status: DC
  Administered 2017-06-01 – 2017-06-03 (×3): 40 mg via ORAL
  Filled 2017-06-01 (×3): qty 1

## 2017-06-01 MED ORDER — OMEGA-3-ACID ETHYL ESTERS 1 G PO CAPS
1.0000 g | ORAL_CAPSULE | Freq: Every day | ORAL | Status: DC
  Administered 2017-06-01 – 2017-06-04 (×4): 1 g via ORAL
  Filled 2017-06-01 (×4): qty 1

## 2017-06-01 MED ORDER — ENOXAPARIN SODIUM 40 MG/0.4ML ~~LOC~~ SOLN
40.0000 mg | Freq: Every day | SUBCUTANEOUS | Status: DC
  Filled 2017-06-01: qty 0.4

## 2017-06-01 MED ORDER — ACETAMINOPHEN 650 MG RE SUPP: 650.0000 mg | RECTAL | Status: DC | PRN

## 2017-06-01 MED ORDER — ASPIRIN 300 MG RE SUPP: 300.0000 mg | Freq: Every day | RECTAL | Status: DC

## 2017-06-01 MED ORDER — SENNOSIDES-DOCUSATE SODIUM 8.6-50 MG PO TABS: 1.0000 | ORAL_TABLET | Freq: Every evening | ORAL | Status: DC | PRN

## 2017-06-01 MED ORDER — ACETAMINOPHEN 160 MG/5ML PO SOLN: 650.0000 mg | ORAL | Status: DC | PRN

## 2017-06-01 MED ORDER — ASPIRIN 325 MG PO TABS
325.0000 mg | ORAL_TABLET | Freq: Every day | ORAL | Status: DC
  Administered 2017-06-01 – 2017-06-04 (×4): 325 mg via ORAL
  Filled 2017-06-01 (×4): qty 1

## 2017-06-01 NOTE — Progress Notes (Signed)
Patient 84 yrs old white male transferred  From [place] center with c/o slurred speech and dysarthria and difficulty walking ..with some  Left eye vision problem. Per pt's wife symptons resolved upon arrival to the emergency r m at [facility].  Pt is alert and oriented x 4 moves all extremities with slight drift of left arm but does not hit the bed. Pt transferred to bed via stretcher by EMS.Marland Kitchenoriented to staff & room and use of the call bell. Cardiac monitoring in use . Pt has dual pacemaker with defibrillator from [place] with HR in 48-50's bp m.. Pt denied pain, dizziness no rCP. Resting comfortably , family at bedside. Plan of care explained  to pt and family . RN will continue to monitor.

## 2017-06-01 NOTE — CV Procedure (Signed)
Echocardiogram not completed, patient was receiving another exam.  Darlina Sicilian RDCS

## 2017-06-01 NOTE — Progress Notes (Signed)
  Echocardiogram 2D Echocardiogram has been performed.  Darlina Sicilian M 06/01/2017, 1:42 PM

## 2017-06-01 NOTE — Consult Note (Signed)
Requesting Physician: Dr. Tana Coast    Chief Complaint: Word finding difficulty, transient vision loss left eye  History obtained from: Patient and Chart     HPI:                                                                                                                                       Gregory Rodgers is an 84 y.o. 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 He also had transient left eye vision loss the day prior to arrival.  Patient is accompanied by his wife and daughters who assist with the history.  They report that he has had occasional problems "finding words" for more than a year, but has been more frequent over the past week. Wife states that even before yesterday while at church patient had sudden onset left eye vision loss which he complained only after the got in the car. The patient is a poor historian but states that he remembers losing vision in the left eye is slightly painful. He denies headache, jaw claudication, joint pains. He has similar episode of vision loss in his left eye that lasted for a few days over a year ago. Today patient was wife are getting out of the car he seemed confused. He tried to talk with the words makes no sense. She got him into the house and noticed that he was dragging his right leg more than usual. Otherwise no facial droop was seen, it did not appear his right any extremity was weaker than normal. She called her daughter will try to talk to him on the phone but he was confused and not making sense and it was decided to call EMS.  He had a pacemaker placed in September 2018. [employer] Jude (serial # [id number] ) right atrial lead and a [employer]. Jude (serial number [id number]) right ventricular defibrillator lead St. Jude(serial number [id number]) LV lead [employer]. Jude (serial Number  [id number]) biventricular ICD placed after he had symptomatic NSVT.      Date last known well: 2.14.19 tPA Given: no, outside window NIHSS: 1 Baseline MRS 0    Past Medical History:  Diagnosis Date  . Basal cell carcinoma    "numerous places"  . Borderline diabetes   . 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 CLL (chronic lymphocytic leukemia) ([place]) 09/2009   stage 0; on observation  . Coronary artery disease involving native heart without angina pectoris    PTCA of RCA in setting of an MI in 1986-87; no follow-up study since available.  . Essential hypertension   . History of kidney stones   . Hyperlipidemia with target LDL less than 70    With known CAD  . Left bundle branch block (LBBB) on  electrocardiogram    Noted as far back as 2008.  . 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 BASAL CELL CARCINOMA EXCISION     "numerous"  . BIV ICD INSERTION CRT-D N/A 01/09/2017   Procedure: BIV ICD INSERTION CRT-D;  Surgeon: Evans Lance, MD;  Location: [facility];  Service: Cardiovascular;  Laterality: N/A;  . CATARACT EXTRACTION W/ INTRAOCULAR LENS  IMPLANT, BILATERAL Bilateral   . CORONARY ANGIOPLASTY  1986/1987  . EP IMPLANTABLE DEVICE N/A 05/10/2016   Procedure: Loop Recorder Insertion;  Surgeon: Evans Lance, MD;  Location: [facility];  Service: Cardiovascular;  Laterality: N/A;  slight blood drop on dressing  . JOINT REPLACEMENT    . MOHS SURGERY Left 01/02/2017   ear  . REPLACEMENT TOTAL KNEE Left   . TRANSTHORACIC ECHOCARDIOGRAM  03/2016   Normal LV size with mild LV hypertrophy. EF 30%. Diffuse  hypokinesis wtih septal-lateral dyssynchrony. Normal RV size and systolic function. Mild MR and mild AI.    Family History  Problem Relation Age of Onset  . Sudden Cardiac Death Neg Hx    Social History:  reports that  has never smoked. he  has never used smokeless tobacco. He reports that he does not drink alcohol or use drugs.  Allergies: No Known Allergies  Medications:                                                                                                                        I reviewed home medications   ROS:                                                                                                                                     14 systems reviewed and negative except above   Examination:  General: Appears well-developed and well-nourished.  Psych: Affect appropriate to situation Eyes: No scleral injection HENT: No OP obstrucion Head: Normocephalic.  Cardiovascular: Normal rate and regular rhythm.  Respiratory: Effort normal and breath sounds normal to anterior ascultation GI: Soft.  No distension. There is no tenderness.  Skin: WDI   Neurological Examination Mental Status: Alert, oriented to month and person, thought content appropriate.  Speech : Slow, has some expressive aphasia. No slurred speech. Able to follow  commands without difficulty. Unable to do serial subtraction of 7. Cranial Nerves: II: Visual fields grossly normal,  III,IV, VI: ptosis not present, extra-ocular motions intact bilaterally, pupils equal, round, reactive to light and accommodation V,VII: smile symmetric, facial light touch sensation normal bilaterally VIII: hearing normal bilaterally IX,X: uvula rises symmetrically XI: bilateral shoulder shrug XII: midline tongue extension Motor: Right : Upper extremity   5/5    Left:     Upper extremity   5/5  Lower extremity   5/5     Lower extremity   5/5 Tone and bulk:normal tone throughout; no atrophy noted Sensory: Pinprick and light touch intact throughout, bilaterally Deep Tendon Reflexes: 2+ and symmetric throughout Plantars: Right: downgoing   Left:  downgoing Cerebellar: normal finger-to-nose, normal rapid alternating movements and normal heel-to-shin test Gait: Did not assess     Lab Results: Basic Metabolic Panel: No results for input(s): NA, K, CL, CO2, GLUCOSE, BUN, CREATININE, CALCIUM, MG, PHOS in the last 168 hours.  CBC: No results for input(s): WBC, NEUTROABS, HGB, HCT, MCV, PLT in the last 168 hours.  Coagulation Studies: No results for input(s): LABPROT, INR in the last 72 hours.  Imaging: No results found.   ASSESSMENT AND PLAN  Given multiple risk factors TIA is is definitely on the differential particular to the transient left eye vision loss. Which also screen for temporal arteritis however the patient denies headache, jaw claudication temporal artery tenderness. Regarding episodic each problems appear to be chronic and unclear if this was a vascular event. He does seem to have some difficulty with memory and should be screened for dementia.   Word finding difficulty Transient Left eye vision loss:   Recommend # MRI of the brain without contrast ( PCM likely compatable) #MRA Head and neck  #Transthoracic Echo  # Start patient on ASA 325mg  daily #Start or continue Atorvastatin 80 mg/other high intensity statin # BP goal: permissive HTN upto 209 systolic, PRNs above 470 # HBAIC and Lipid profile # Telemetry monitoring # Frequent neuro checks # NPO until passes stroke swallow screen  Memory problems, difficulty with speech Check B12, TSH, ammonia Formal neuropsych eval to screen for dementia after discharge and outpatient neurology follow up  Please page stroke NP  Or  PA  Or MD from 8am -4 pm  as this patient from this time will be  followed by the stroke.   You can look them up on [email or web address]  Password [facility]    Sushanth Aroor [facility] Pager Number [phone]

## 2017-06-01 NOTE — Progress Notes (Signed)
The Stroke Team will see this patient tomorrow a.m. 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 The plan will be to anticoagulate with IV heparin per pharmacy. Dr. Estanislado Pandy has been contacted. He will consult tomorrow for a possible cerebral angiogram to be performed on Monday. The patient may require intervention prior to this should his condition deteriorate. Discussed with Dr. Jaynee Eagles.  Mikey Bussing PA-C [facility] Pager [phone] 06/01/2017, 3:58 PM

## 2017-06-01 NOTE — Plan of Care (Signed)
  Clinical Measurements: Ability to maintain clinical measurements within normal limits will improve 06/01/2017 0743 - Progressing by Marcos Eke, RN   Clinical Measurements: Will remain free from infection 06/01/2017 0743 - Progressing by Carin Hock I, RN   Clinical Measurements: Diagnostic test results will improve 06/01/2017 0743 - Progressing by Marcos Eke, RN

## 2017-06-01 NOTE — Progress Notes (Signed)
[place] for heparin Indication:   Carotid bulb artery thrombosis   Allergies  Allergen Reactions  . Tape Other (See Comments)    "PLASTIC" TAPE PULLS OFF THE SKIN AND THE REMOVAL OF ALL TAPES BRUISE HIM!!!    Patient Measurements: Height: 6' (182.9 cm) Weight: 177 lb 11.1 oz (80.6 kg) IBW/kg (Calculated) : 77.6 Heparin Dosing Weight: 80.6kg  Vital Signs: Temp: 98.7 F (37.1 C) (02/16 2138) Temp Source: Oral (02/16 2138) BP: 153/55 (02/16 2138) Pulse Rate: 59 (02/16 2138)  Labs: Recent Labs    06/01/17 0452 06/01/17 2246  HGB 10.4*  --   HCT 32.5*  --   PLT 142*  --   LABPROT 15.0  --   INR 1.19  --   HEPARINUNFRC  --  0.19*  CREATININE 1.03  --     Estimated Creatinine Clearance: 55.5 mL/min (by C-G formula based on SCr of 1.03 mg/dL).   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  Phillis Knack, PharmD, BCPS  06/01/2017 11:38 PM

## 2017-06-01 NOTE — H&P (Signed)
History and Physical    Gregory Rodgers ZWC:[id number] DOB: 06-04-1927 DOA: 06/01/2017  PCP: Melony Overly, MD   Patient coming from: Home  Chief Complaint: Transient speech difficulty, transient right eye blindness   HPI: Gregory Rodgers is a 84 y.o. 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  Patient is accompanied by his wife and daughters who assist with the history.  They report that he has had occasional problems "finding words" for more than a year, but has been more frequent over the past week and acutely worse yesterday.  Patient experiences a vague sensation of unwellness associated with these episodes that he has difficulty describing in any detail.  He denies headache, focal numbness or weakness, or loss of coordination.  No memory problems per report of family.  No recent fall or trauma.  He has also experienced transient blindness involving the right eye, initially a couple of years ago, and then again yesterday.  After the first episode, he was evaluated by ophthalmology and no etiology was determined.  He had a recurrence yesterday that spontaneously resolved within an hour or so.  ED Course: Upon arrival to the ED, patient is found to be afebrile, saturating well on room air, slightly bradycardic, and with normal blood pressure.  EKG features a paced rhythm with nonspecific IVCD.  Chest x-rays negative for acute cardiopulmonary disease and noncontrast head CT is negative for acute intracranial abnormality.  Chemistry panel is unremarkable and CBC is notable for chronic leukocytosis and chronic stable microcytic anemia.  Troponin is undetectable and urinalysis is unremarkable.  Neurology was consulted by the ED physician to see the patient in consultation, and  recommended a medical admission.  Patient will be observed on the telemetry unit for ongoing evaluation and transient speech difficulty and transient binocular blindness, now at baseline.  Review of Systems:  All other systems reviewed and apart from HPI, are negative.  Past Medical History:  Diagnosis Date  . Basal cell carcinoma    "numerous places"  . Borderline diabetes   . 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 CLL (chronic lymphocytic leukemia) ([place]) 09/2009   stage 0; on observation  . Coronary artery disease involving native heart without angina pectoris    PTCA of RCA in setting of an MI in 1986-87; no follow-up study since available.  . Essential hypertension   . History of kidney stones   . Hyperlipidemia with target LDL less than 70    With known CAD  . Left bundle branch block (LBBB) on electrocardiogram    Noted as far back as 2008.  . 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 BASAL CELL CARCINOMA EXCISION     "numerous"  . BIV ICD INSERTION CRT-D N/A 01/09/2017   Procedure: BIV ICD INSERTION CRT-D;  Surgeon: Evans Lance, MD;  Location: [facility];  Service: Cardiovascular;  Laterality: N/A;  . CATARACT EXTRACTION W/ INTRAOCULAR LENS  IMPLANT, BILATERAL Bilateral   . CORONARY ANGIOPLASTY  1986/1987  . EP IMPLANTABLE DEVICE N/A 05/10/2016   Procedure: Loop Recorder Insertion;  Surgeon: Evans Lance, MD;  Location: [facility];  Service: Cardiovascular;  Laterality: N/A;  slight blood  drop on dressing  . JOINT REPLACEMENT    . MOHS SURGERY Left 01/02/2017   ear  . REPLACEMENT TOTAL KNEE Left   . TRANSTHORACIC ECHOCARDIOGRAM  03/2016   Normal LV size with mild LV hypertrophy. EF 30%. Diffuse  hypokinesis wtih septal-lateral dyssynchrony. Normal RV size and systolic function. Mild MR and mild AI.     reports that  has never  smoked. he has never used smokeless tobacco. He reports that he does not drink alcohol or use drugs.  No Known Allergies  Family History  Problem Relation Age of Onset  . Sudden Cardiac Death Neg Hx      Prior to Admission medications   Medication Sig Start Date End Date Taking? Authorizing Provider  aspirin 81 MG tablet Take 81 mg by mouth daily.    [provider]  Calcium Carbonate-Simethicone (MAALOX MAX PO) Take 5 mLs by mouth daily as needed (upset stomach).    [provider]  co-enzyme Q-10 30 MG capsule Take 30 mg by mouth daily.    [provider]  fish oil-omega-3 fatty acids 1000 MG capsule Take 1 g by mouth daily.     [provider]  ibuprofen (ADVIL,MOTRIN) 200 MG tablet Take 200 mg by mouth every 6 (six) hours as needed for mild pain.    [provider]  lisinopril (PRINIVIL,ZESTRIL) 10 MG tablet Take 1 tablet (10 mg total) by mouth daily. PLEASE CONTACT OFFICE FOR ADDITIONAL REFILLS 03/14/17   Leonie Man, MD  metoprolol succinate (TOPROL XL) 25 MG 24 hr tablet Take 1 tablet (25 mg total) by mouth daily. 01/10/17 01/10/18  Baldwin Jamaica, PA-C  Multiple Vitamin (MULTIVITAMIN) tablet Take 1 tablet by mouth daily.    [provider]    Physical Exam: Vitals:   06/01/17 0200  BP: (!) 130/53  Pulse: (!) 53  Resp: 17  Temp: 98.1 F (36.7 C)  TempSrc: Oral  SpO2: 96%  Weight: 80.6 kg (177 lb 11.1 oz)      Constitutional: NAD, calm  Eyes: PERTLA, lids and conjunctivae normal ENMT: Mucous membranes are moist. Posterior pharynx clear of any exudate or lesions.   Neck: normal, supple, no masses, no thyromegaly Respiratory: clear to auscultation bilaterally, no wheezing, no crackles. Normal respiratory effort.   Cardiovascular: S1 & S2 heard, regular rate and rhythm. No significant JVD. Abdomen: No distension, no tenderness, no masses palpated. Bowel sounds normal.  Musculoskeletal: no clubbing / cyanosis.  No joint deformity upper and lower extremities.   Skin: no significant rashes, lesions, ulcers. Warm, dry, well-perfused. Neurologic: CN 2-12 grossly intact. Sensation intact. Strength 5/5 in all 4 limbs. Gross hearing deficit.  Psychiatric:  Alert and oriented x 3. Pleasant and cooperative.     Labs on Admission: I have personally reviewed following labs and imaging studies  CBC: No results for input(s): WBC, NEUTROABS, HGB, HCT, MCV, PLT in the last 168 hours. Basic Metabolic Panel: No results for input(s): NA, K, CL, CO2, GLUCOSE, BUN, CREATININE, CALCIUM, MG, PHOS in the last 168 hours. GFR: CrCl cannot be calculated (Patient's most recent lab result is older than the maximum 21 days allowed.). Liver Function Tests: No results for input(s): AST, ALT, ALKPHOS, BILITOT, PROT, ALBUMIN in the last 168 hours. No results for input(s): LIPASE, AMYLASE in the last 168 hours. No results for input(s): AMMONIA in the last 168 hours. Coagulation Profile: No results for input(s): INR, PROTIME in the last 168 hours. Cardiac Enzymes: No results for input(s):  CKTOTAL, CKMB, CKMBINDEX, TROPONINI in the last 168 hours. BNP (last 3 results) No results for input(s): PROBNP in the last 8760 hours. HbA1C: No results for input(s): HGBA1C in the last 72 hours. CBG: No results for input(s): GLUCAP in the last 168 hours. Lipid Profile: No results for input(s): CHOL, HDL, LDLCALC, TRIG, CHOLHDL, LDLDIRECT in the last 72 hours. Thyroid Function Tests: No results for input(s): TSH, T4TOTAL, FREET4, T3FREE, THYROIDAB in the last 72 hours. Anemia Panel: No results for input(s): VITAMINB12, FOLATE, FERRITIN, TIBC, IRON, RETICCTPCT in the last 72 hours. Urine analysis:    Component Value Date/Time   COLORURINE YELLOW 02/05/2016 1620   APPEARANCEUR CLEAR 02/05/2016 1620   LABSPEC 1.024 02/05/2016 1620   PHURINE 6.0 02/05/2016 1620   GLUCOSEU NEGATIVE 02/05/2016 1620   HGBUR NEGATIVE 02/05/2016 1620    BILIRUBINUR NEGATIVE 02/05/2016 1620   KETONESUR NEGATIVE 02/05/2016 1620   PROTEINUR NEGATIVE 02/05/2016 1620   NITRITE NEGATIVE 02/05/2016 1620   LEUKOCYTESUR NEGATIVE 02/05/2016 1620   Sepsis Labs: @LABRCNTIP (procalcitonin:4,lacticidven:4) )No results found for this or any previous visit (from the past 240 hour(s)).   Radiological Exams on Admission: No results found.  EKG: Independently reviewed. Paced, non-specific IVCD.   Assessment/Plan  1. Transient speech disturbance; transient monocular blindness  - Presents with transient difficulty with word-finding, and transient blindness involving right eye  - Both problems had occurred previously, but were worse yesterday  - He is at baseline at time of admission, head CT negative for acute abnormality, no focal deficit identified  - Neurology was consulted by EDP, will follow-up on recommendations  - Continue cardiac monitoring, frequent neuro checks, PT/OT/SLP evals  - MRI brain and MRA head and neck if his ICD is compatible; check echocardiogram, fasting lipids, A1c    2. CLL  - CBC is stable  - Follows with oncology - Not requiring treatment    3. CAD  - No anginal complaints  - Continue ASA, hold lisinopril and metoprolol pending MRI brain    4. Hypertension  - BP at goal  - Hold lisinopril and metoprolol initially while evaluating for possible TIA/CVA    5. Chronic combined systolic/diastolic CHF  - Appears well-compensated - Echo ordered as above   - ACE and beta-blocker held initially with concern for possible acute CVA     DVT prophylaxis: Lovenox Code Status: Full  Family Communication: Wife and daughters updated at bedside Disposition Plan: Observe on telemetry Consults called: Neurology  Admission status: Observation    Vianne Bulls, MD [facility] Pager [phone]  If 7PM-7AM, please contact night-coverage [email or web address] Password [email or web address]  06/01/2017, 3:59 AM

## 2017-06-01 NOTE — Progress Notes (Signed)
[facility]                                                                              Patient Demographics  Gregory Rodgers, is a 84 y.o. male, DOB - June 28, 1927, MVH:[id number]  Admit date - 06/01/2017   Admitting Physician Vianne Bulls, MD  Outpatient Primary MD for the patient is Melony Overly, MD  Outpatient specialists:   LOS - 0  days   Medical records reviewed and are as summarized below:    No chief complaint on file.      Brief summary   Patient is a 84 year old male with hypertension, CAD, chronic combined systolic and diastolic CHF, with ICD pacemaker presented to ED with intermittent slurring of speech, transient right eye blindness. Patient reported occasional problems with finding words for more than a year but has been more frequent over the past week and acutely worse a day before the admission. Also experienced transient blindness in the right eye initially a couple of years ago then again a day before the admission. CT head negative. Patient was admitted for further workup.   Assessment & Plan    Principal Problem:   Transient speech disturbance - Presented with transient slurred speech and difficulty word finding, visual disturbance in the right eye - CT head negative for acute stroke - Called [employer] medical rep, will attempt MRI of the brain, MRA head and neck today - Follow 2-D echo, does not need carotid Dopplers with MRA neck - PT OT recommended home health PT, case management consult placed - LDL 81, started on Lipitor. 40 mg daily, continue aspirin 325 mg daily - Neurology consulted  Active Problems:   CLL (chronic lymphocytic leukemia) ([place]) - Reviewed CBC, WBC count slightly elevated (per family this is highest), follows with oncology - No infectious signs or symptoms. For completion will check UA, culture, blood cultures, chest x-ray for baseline     Essential hypertension - Currently stable, ACE inhibitor, beta  blocker held for now    CAD S/P percutaneous coronary angioplasty,  Ischemic cardiomyopathy: EF 40-45%  now 30% - Continue aspirin, statin, follow 2-D echo   Code Status: Full CODE STATUS DVT Prophylaxis:  Lovenox  Family Communication: Discussed in detail with the patient, all imaging results, lab results explained to the patient, wife and 2 daughters    Disposition Plan: Once the workup is complete  Time Spent in minutes   35 minutes  Procedures:  CT head  Consultants:   neuro  Antimicrobials:      Medications  Scheduled Meds: . aspirin  300 mg Rectal Daily   Or  . aspirin  325 mg Oral Daily  . enoxaparin (LOVENOX) injection  40 mg Subcutaneous Daily  . multivitamin with minerals  1 tablet Oral Daily  . omega-3 acid ethyl esters  1 g Oral Daily   Continuous Infusions: . sodium chloride 75 mL (06/01/17 0400)   PRN Meds:.acetaminophen **OR** acetaminophen (TYLENOL) oral liquid 160 mg/5 mL **OR** acetaminophen, alum & mag hydroxide-simeth, senna-docusate   Antibiotics   Anti-infectives (From admission, onward)   None  Subjective:   Gregory Rodgers was seen and examined today. Feels better now, denies any specific complaints. Speech improving. Per wife he normally speaks slowly but had trouble finding words and slurred day before the admission. Patient denies dizziness, chest pain, shortness of breath, abdominal pain, N/V/D/C. No acute events overnight.    Objective:   Vitals:   06/01/17 0200 06/01/17 0400 06/01/17 0600 06/01/17 0800  BP: (!) 130/53 (!) 116/48 (!) 127/50 137/63  Pulse: (!) 53   (!) 56  Resp: 17 16 18 16   Temp: 98.1 F (36.7 C) 97.6 F (36.4 C) 98 F (36.7 C) 98.1 F (36.7 C)  TempSrc: Oral Oral Oral Oral  SpO2: 96% 98% 96% 97%  Weight: 80.6 kg (177 lb 11.1 oz)     Height: 6' (1.829 m)       Intake/Output Summary (Last 24 hours) at 06/01/2017 1039 Last data filed at 06/01/2017 0600 Gross per 24 hour  Intake 350 ml  Output  400 ml  Net -50 ml     Wt Readings from Last 3 Encounters:  06/01/17 80.6 kg (177 lb 11.1 oz)  04/10/17 80.2 kg (176 lb 12.8 oz)  01/10/17 77.1 kg (169 lb 15.6 oz)     Exam  General: Alert and oriented x 3, NAD, no significant dysarthria, speech fairly clear, hearing deficit  Eyes:   HEENT:  Atraumatic, normocephalic  Cardiovascular: S1 S2 auscultated, no rubs, murmurs or gallops. Regular rate and rhythm.  Respiratory: Clear to auscultation bilaterally, no wheezing, rales or rhonchi  Gastrointestinal: Soft, nontender, nondistended, + bowel sounds  Ext: no pedal edema bilaterally  Neuro: AAOx3, Cr N's II- XII. Strength 5/5 upper and lower extremities bilaterally,   Musculoskeletal: No digital cyanosis, clubbing  Skin: No rashes  Psych: Normal affect and demeanor, alert and oriented x3    Data Reviewed:  I have personally reviewed following labs and imaging studies  Micro Results Recent Results (from the past 240 hour(s))  MRSA PCR Screening     Status: None   Collection Time: 06/01/17  6:18 AM  Result Value Ref Range Status   MRSA by PCR NEGATIVE NEGATIVE Final    Comment:        The GeneXpert MRSA Assay (FDA approved for NASAL specimens only), is one component of a comprehensive MRSA colonization surveillance program. It is not intended to diagnose MRSA infection nor to guide or monitor treatment for MRSA infections. Performed at [facility], [place] [address] [place], [place] [address]     Radiology Reports No results found.  Lab Data:  CBC: Recent Labs  Lab 06/01/17 0452  WBC 40.9*  NEUTROABS 2.0  HGB 10.4*  HCT 32.5*  MCV 104.8*  PLT 737*   Basic Metabolic Panel: Recent Labs  Lab 06/01/17 0452  NA 140  K 4.1  CL 109  CO2 22  GLUCOSE 106*  BUN 20  CREATININE 1.03  CALCIUM 8.4*   GFR: Estimated Creatinine Clearance: 55.5 mL/min (by C-G formula based on SCr of 1.03 mg/dL). Liver Function Tests: Recent Labs  Lab  06/01/17 0452  AST 18  ALT 12*  ALKPHOS 59  BILITOT 0.7  PROT 4.9*  ALBUMIN 3.2*   No results for input(s): LIPASE, AMYLASE in the last 168 hours. Recent Labs  Lab 06/01/17 0452  AMMONIA 31   Coagulation Profile: Recent Labs  Lab 06/01/17 0452  INR 1.19   Cardiac Enzymes: No results for input(s): CKTOTAL, CKMB, CKMBINDEX, TROPONINI in the last 168 hours. BNP (  last 3 results) No results for input(s): PROBNP in the last 8760 hours. HbA1C: Recent Labs    06/01/17 0452  HGBA1C 6.0*   CBG: No results for input(s): GLUCAP in the last 168 hours. Lipid Profile: Recent Labs    06/01/17 0452  CHOL 134  HDL 37*  LDLCALC 81  TRIG 80  CHOLHDL 3.6   Thyroid Function Tests: Recent Labs    06/01/17 0452  TSH 2.095   Anemia Panel: Recent Labs    06/01/17 0452  VITAMINB12 369   Urine analysis:    Component Value Date/Time   COLORURINE YELLOW 02/05/2016 1620   APPEARANCEUR CLEAR 02/05/2016 1620   LABSPEC 1.024 02/05/2016 1620   PHURINE 6.0 02/05/2016 1620   GLUCOSEU NEGATIVE 02/05/2016 1620   HGBUR NEGATIVE 02/05/2016 1620   BILIRUBINUR NEGATIVE 02/05/2016 1620   KETONESUR NEGATIVE 02/05/2016 1620   PROTEINUR NEGATIVE 02/05/2016 1620   NITRITE NEGATIVE 02/05/2016 1620   LEUKOCYTESUR NEGATIVE 02/05/2016 1620     Ripudeep Rai M.D. [facility] 06/01/2017, 10:39 AM  Pager: [phone] Between 7am to 7pm - call Pager - [phone]  After 7pm go to [email or web address] - password [email or web address]  Call night coverage person covering after 7pm

## 2017-06-01 NOTE — Progress Notes (Signed)
ANTICOAGULATION CONSULT NOTE - Initial Consult  Pharmacy Consult for heparin Indication:   Carotid bulb artery thrombosis   No Known Allergies  Patient Measurements: Height: 6' (182.9 cm) Weight: 177 lb 11.1 oz (80.6 kg) IBW/kg (Calculated) : 77.6 Heparin Dosing Weight: 80.6kg  Vital Signs: Temp: 98 F (36.7 C) (02/16 1349) Temp Source: Oral (02/16 1349) BP: 117/52 (02/16 1349) Pulse Rate: 58 (02/16 1349)  Labs: Recent Labs    06/01/17 0452  HGB 10.4*  HCT 32.5*  PLT 142*  LABPROT 15.0  INR 1.19  CREATININE 1.03    Estimated Creatinine Clearance: 55.5 mL/min (by C-G formula based on SCr of 1.03 mg/dL).   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 With recent stroke, to utilize no boluses as well as lower heparin level goal which will correlate with aPTT of ~60-80 seconds per request.  Patient is not on anticoagulation PTA. Prophylactic Lovenox ordered, but not given yet this admission. Baseline Hgb and plts low, but appear to be around baseline.    Goal of Therapy:  Heparin level 0.3-0.5 units/ml Monitor platelets by anticoagulation protocol: Yes   Plan:  Heparin 1050 units/hr Heparin level at 2300 tonight Daily heparin level and CBC Follow for s/s bleeding and long term plan  Gunner Iodice D. Ginamarie Banfield, PharmD, BCPS Clinical Pharmacist Clinical Phone for 06/01/2017 until 3:30pm: [phone] If after 3:30pm, please call main pharmacy at [phone] 06/01/2017 2:32 PM

## 2017-06-01 NOTE — Evaluation (Signed)
Physical Therapy Evaluation Patient Details Name: Gregory Rodgers MRN: [id number] DOB: 1927/05/02 Today's Date: 06/01/2017   History of Present Illness  Pt is an 84 y.o. male with PMH significant for hypertension, coronary artery disease, and chronic combined systolic and diastolic CHF with ICD. Had pacemaker placed in September 2018. He presented to the ED with transient word finding deficits and intermittent sudden onset L eye vision loss. MRI pending.    Clinical Impression  Pt admitted with above diagnosis. Pt currently with functional limitations due to the deficits listed below (see PT Problem List). PTA, patient living in 2 story home with stairs to enter with wife who able bodied. Pt was independent with all mobility with active lifestyle performing chores and exercise. Upon eval pt presents with deficits in balance and coordination, but overall good strength BLE. Pt and family report he is feeling weaker overall and having a harder time ambulating than baseline. Unsteady gait and poor balance, pt ambulating short distances with and without RW. Family prefers patient to come home once medically ready and wil be available 24 hour for physical assistance. Educated on proper guarding and benefits of HHPT to improve safety and begin rehab. PT will focus on stair training next visit 2/17.  Pt will benefit from skilled PT to increase their independence and safety with mobility to allow discharge to the venue listed below.       Follow Up Recommendations Home health PT;Supervision/Assistance - 24 hour    Equipment Recommendations  None recommended by PT    Recommendations for Other Services       Precautions / Restrictions Precautions Precautions: Fall;ICD/Pacemaker Restrictions Weight Bearing Restrictions: No      Mobility  Bed Mobility Overal bed mobility: Needs Assistance Bed Mobility: Supine to Sit;Sit to Supine     Supine to sit: Min guard Sit to supine: Min guard   General  bed mobility comments: Min guard assist for safety with increased time and effort.   Transfers Overall transfer level: Needs assistance Equipment used: 1 person hand held assist Transfers: Sit to/from Stand Sit to Stand: Min assist         General transfer comment: Min assist for stability  Ambulation/Gait Ambulation/Gait assistance: Min assist Ambulation Distance (Feet): 70 Feet Assistive device: None;Rolling walker (2 wheeled) Gait Pattern/deviations: Step-to pattern;Narrow base of support;Staggering left;Staggering right Gait velocity: decreased   General Gait Details: short step, narrow BOS, unsteady gait. Improved stability with RW but still requires min guarding. educated family on level of support required for safe transfres and ambulation.   Stairs            Wheelchair Mobility    Modified Rankin (Stroke Patients Only)       Balance Overall balance assessment: Needs assistance Sitting-balance support: No upper extremity supported;Feet supported Sitting balance-Leahy Scale: Fair     Standing balance support: No upper extremity supported;Single extremity supported;During functional activity Standing balance-Leahy Scale: Poor Standing balance comment: Relies on single UE support.                              Pertinent Vitals/Pain Pain Assessment: No/denies pain    Home Living Family/patient expects to be discharged to:: Private residence Living Arrangements: Spouse/significant other Available Help at Discharge: Family;Available 24 hours/day Type of Home: House Home Access: Stairs to enter Entrance Stairs-Rails: Right;Left;Can reach both Entrance Stairs-Number of Steps: 2 in front, 4 in back Home Layout: Two level;Bed/bath upstairs Home  Equipment: Shower seat      Prior Function Level of Independence: Independent         Comments: Does not use assistive device, rides his bike regularly, does yard work and was using his tractor this  week.      Hand Dominance        Extremity/Trunk Assessment   Upper Extremity Assessment Upper Extremity Assessment: Defer to OT evaluation RUE Deficits / Details: Slightly diminished strength in shoulder flexion, 4/5 on manual muscle test. Sensation in tact.     Lower Extremity Assessment Lower Extremity Assessment: (BLE strength 4-/5)       Communication   Communication: Expressive difficulties;HOH(hx of word finding)  Cognition Arousal/Alertness: Awake/alert Behavior During Therapy: WFL for tasks assessed/performed Overall Cognitive Status: Difficult to assess Area of Impairment: Safety/judgement                         Safety/Judgement: Decreased awareness of safety;Decreased awareness of deficits     General Comments: Pt slow to respond at times but difficult to fully assess cognition due to expressive deficits.       General Comments General comments (skin integrity, edema, etc.): Wife and daughters present. discusses safety considerations for home and benefits of HHPT     Exercises     Assessment/Plan    PT Assessment Patient needs continued PT services  PT Problem List Decreased strength;Decreased range of motion;Decreased activity tolerance;Decreased balance;Decreased mobility;Decreased coordination;Decreased cognition;Decreased knowledge of use of DME;Decreased safety awareness       PT Treatment Interventions      PT Goals (Current goals can be found in the Care Plan section)  Acute Rehab PT Goals Patient Stated Goal: go home and get back to exercising PT Goal Formulation: With patient/family Time For Goal Achievement: 06/08/17 Potential to Achieve Goals: Good    Frequency Min 4X/week   Barriers to discharge        Co-evaluation               AM-PAC PT "6 Clicks" Daily Activity  Outcome Measure Difficulty turning over in bed (including adjusting bedclothes, sheets and blankets)?: A Little Difficulty moving from lying on back  to sitting on the side of the bed? : A Little Difficulty sitting down on and standing up from a chair with arms (e.g., wheelchair, bedside commode, etc,.)?: A Little Help needed moving to and from a bed to chair (including a wheelchair)?: A Little Help needed walking in hospital room?: A Little Help needed climbing 3-5 steps with a railing? : A Lot 6 Click Score: 17    End of Session Equipment Utilized During Treatment: Gait belt Activity Tolerance: Patient tolerated treatment well Patient left: in chair;with call bell/phone within reach;with family/visitor present Nurse Communication: Mobility status PT Visit Diagnosis: Unsteadiness on feet (R26.81);Other abnormalities of gait and mobility (R26.89);Other symptoms and signs involving the nervous system (R29.898);Muscle weakness (generalized) (M62.81);Difficulty in walking, not elsewhere classified (R26.2)    Time: 4098-1191 PT Time Calculation (min) (ACUTE ONLY): 28 min   Charges:   PT Evaluation $PT Eval Low Complexity: 1 Low PT Treatments $Gait Training: 8-22 mins   PT G Codes:       Reinaldo Berber, PT, DPT Acute Rehab Services Pager: [phone]    Reinaldo Berber 06/01/2017, 9:45 AM

## 2017-06-01 NOTE — Evaluation (Addendum)
Occupational Therapy Evaluation Patient Details Name: Gregory Rodgers MRN: [id number] DOB: 1927/05/25 Today's Date: 06/01/2017    History of Present Illness Pt is an 84 y.o. male with PMH significant for hypertension, coronary artery disease, and chronic combined systolic and diastolic CHF with ICD. Had pacemaker placed in September 2018. He presented to the ED with transient word finding deficits and intermittent sudden onset L eye vision loss. MRI pending.   Clinical Impression   PTA, pt and family report independence with ADL and functional mobility. Pt maintains an active lifestyle, exercising regularly and continues to complete yard work. They report intermittent word finding deficits and did note expressive communication difficulties on my assessment. He additionally presents with decreased R UE strength in shoulder flexion, L UE downward drift with sustained flexion, decreased balance, and generalized weakness impacting his ability to participate in ADL at [facility]. Pt requiring min assist for stability during toilet transfers and LB ADL tasks at this time. He did demonstrate decreased smoothness of horizontal visual tracking this session. Family reports intermittent L sided visual loss but not present during assessment and will continue to assess. He would benefit from continued OT services while admitted to improve independence and safety with ADL and functional mobility and recommend home health OT follow-up post-acute D/C.     Follow Up Recommendations  Home health OT;Supervision/Assistance - 24 hour    Equipment Recommendations  3 in 1 bedside commode    Recommendations for Other Services       Precautions / Restrictions Precautions Precautions: Fall;ICD/Pacemaker Restrictions Weight Bearing Restrictions: No      Mobility Bed Mobility Overal bed mobility: Needs Assistance Bed Mobility: Supine to Sit;Sit to Supine     Supine to sit: Min guard Sit to supine: Min guard    General bed mobility comments: Min guard assist for safety with increased time and effort.   Transfers Overall transfer level: Needs assistance Equipment used: 1 person hand held assist Transfers: Sit to/from Stand Sit to Stand: Min assist         General transfer comment: Min assist for stability    Balance Overall balance assessment: Needs assistance Sitting-balance support: No upper extremity supported;Feet supported Sitting balance-Leahy Scale: Fair     Standing balance support: No upper extremity supported;Single extremity supported;During functional activity Standing balance-Leahy Scale: Poor Standing balance comment: Relies on single UE support.                            ADL either performed or assessed with clinical judgement   ADL Overall ADL's : Needs assistance/impaired Eating/Feeding: Set up;Bed level   Grooming: Min guard;Standing;Brushing hair Grooming Details (indicate cue type and reason): Supporting self with single UE on sink.  Upper Body Bathing: Min guard;Sitting   Lower Body Bathing: Minimal assistance;Sit to/from stand   Upper Body Dressing : Min guard;Sitting   Lower Body Dressing: Minimal assistance;Sit to/from stand   Toilet Transfer: Minimal assistance;Ambulation Toilet Transfer Details (indicate cue type and reason): handheld assist and min assist for balance Toileting- Clothing Manipulation and Hygiene: Minimal assistance;Sit to/from stand       Functional mobility during ADLs: Minimal assistance General ADL Comments: Pt requiring single UE support for safety and to maintain balance.      Vision Baseline Vision/History: Wears glasses Patient Visual Report: Other (comment)(transient L sided deficits) Vision Assessment?: Yes Eye Alignment: Within Functional Limits Ocular Range of Motion: Within Functional Limits Alignment/Gaze Preference: Within Defined Limits Tracking/Visual  Pursuits: Decreased smoothness of horizontal  tracking Visual Fields: No apparent deficits Additional Comments: Able to complete visual assessment with OT and follow commands well. Decreased smoothness of tracking noted in horizontal planes.      Perception     Praxis      Pertinent Vitals/Pain Pain Assessment: No/denies pain     Hand Dominance     Extremity/Trunk Assessment Upper Extremity Assessment Upper Extremity Assessment: RUE deficits/detail RUE Deficits / Details: Slightly diminished strength in shoulder flexion, 4/5 on manual muscle test. Sensation in tact.    Lower Extremity Assessment Lower Extremity Assessment: Defer to PT evaluation       Communication Communication Communication: Expressive difficulties;HOH   Cognition Arousal/Alertness: Awake/alert Behavior During Therapy: WFL for tasks assessed/performed Overall Cognitive Status: Difficult to assess Area of Impairment: Safety/judgement                         Safety/Judgement: Decreased awareness of safety;Decreased awareness of deficits     General Comments: Pt slow to respond at times but difficult to fully assess cognition due to expressive deficits.    General Comments  Wife and daughters present during assessment.     Exercises     Shoulder Instructions      Home Living Family/patient expects to be discharged to:: Private residence Living Arrangements: Spouse/significant other Available Help at Discharge: Family;Available 24 hours/day Type of Home: House Home Access: Stairs to enter [employer] of Steps: 2 in front, 4 in back Entrance Stairs-Rails: Right;Left;Can reach both(only in back) Home Layout: Two level;Bed/bath upstairs Alternate Level Stairs-Number of Steps: 8 steps to bedrooms   Bathroom Shower/Tub: Walk-in shower         Home Equipment: Shower seat(has shower seat but does not use it)          Prior Functioning/Environment Level of Independence: Independent        Comments: Does not use  assistive device, rides his bike regularly, does yard work and was using his tractor this week.         OT Problem List:        OT Treatment/Interventions: Self-care/ADL training;Therapeutic exercise;Neuromuscular education;Energy conservation;DME and/or AE instruction;Therapeutic activities;Cognitive remediation/compensation;Visual/perceptual remediation/compensation;Patient/family education;Balance training    OT Goals(Current goals can be found in the care plan section) Acute Rehab OT Goals Patient Stated Goal: go home and get back to exercising OT Goal Formulation: With patient/family Time For Goal Achievement: 06/15/17 Potential to Achieve Goals: Good ADL Goals Pt Will Perform Grooming: with modified independence;standing Pt Will Perform Lower Body Dressing: with modified independence;sit to/from stand Pt Will Transfer to Toilet: with modified independence;ambulating;regular height toilet Pt Will Perform Toileting - Clothing Manipulation and hygiene: with modified independence;sit to/from stand Pt Will Perform Tub/Shower Transfer: ambulating;shower seat;Shower transfer;rolling walker;with supervision Pt/caregiver will Perform Home Exercise Program: Increased strength;Right Upper extremity;With Supervision  OT Frequency: Min 2X/week   Barriers to D/C:            Co-evaluation              AM-PAC PT "6 Clicks" Daily Activity     Outcome Measure Help from another person eating meals?: A Little Help from another person taking care of personal grooming?: A Little Help from another person toileting, which includes using toliet, bedpan, or urinal?: A Little Help from another person bathing (including washing, rinsing, drying)?: A Little Help from another person to put on and taking off regular upper body clothing?: A Little Help from  another person to put on and taking off regular lower body clothing?: A Little 6 Click Score: 18   End of Session Equipment Utilized During  Treatment: Gait belt Nurse Communication: Mobility status  Activity Tolerance: Patient tolerated treatment well Patient left: in bed;with call bell/phone within reach;with family/visitor present  OT Visit Diagnosis: Other abnormalities of gait and mobility (R26.89);Muscle weakness (generalized) (M62.81);Cognitive communication deficit (R41.841)                Time: 6015-6153 OT Time Calculation (min): 25 min Charges:  OT General Charges $OT Visit: 1 Visit OT Evaluation $OT Eval Moderate Complexity: 1 Mod OT Treatments $Self Care/Home Management : 8-22 mins G-Codes:     Norman Herrlich, MS OTR/L  Pager: Gerald A Stoy Fenn 06/01/2017, 9:09 AM

## 2017-06-02 ENCOUNTER — Encounter (HOSPITAL_COMMUNITY): Payer: Self-pay | Admitting: General Surgery

## 2017-06-02 DIAGNOSIS — I110 Hypertensive heart disease with heart failure: Secondary | ICD-10-CM | POA: Diagnosis present

## 2017-06-02 DIAGNOSIS — I63412 Cerebral infarction due to embolism of left middle cerebral artery: Secondary | ICD-10-CM | POA: Diagnosis present

## 2017-06-02 DIAGNOSIS — I63032 Cerebral infarction due to thrombosis of left carotid artery: Secondary | ICD-10-CM | POA: Diagnosis present

## 2017-06-02 DIAGNOSIS — Z9841 Cataract extraction status, right eye: Secondary | ICD-10-CM | POA: Diagnosis not present

## 2017-06-02 DIAGNOSIS — Z87442 Personal history of urinary calculi: Secondary | ICD-10-CM | POA: Diagnosis not present

## 2017-06-02 DIAGNOSIS — I712 Thoracic aortic aneurysm, without rupture: Secondary | ICD-10-CM | POA: Diagnosis present

## 2017-06-02 DIAGNOSIS — I10 Essential (primary) hypertension: Secondary | ICD-10-CM | POA: Diagnosis not present

## 2017-06-02 DIAGNOSIS — R29701 NIHSS score 1: Secondary | ICD-10-CM | POA: Diagnosis present

## 2017-06-02 DIAGNOSIS — H53123 Transient visual loss, bilateral: Secondary | ICD-10-CM | POA: Diagnosis present

## 2017-06-02 DIAGNOSIS — E785 Hyperlipidemia, unspecified: Secondary | ICD-10-CM | POA: Diagnosis present

## 2017-06-02 DIAGNOSIS — Q211 Atrial septal defect: Secondary | ICD-10-CM | POA: Diagnosis not present

## 2017-06-02 DIAGNOSIS — I255 Ischemic cardiomyopathy: Secondary | ICD-10-CM | POA: Diagnosis present

## 2017-06-02 DIAGNOSIS — C9110 Chronic lymphocytic leukemia of B-cell type not having achieved remission: Secondary | ICD-10-CM | POA: Diagnosis present

## 2017-06-02 DIAGNOSIS — I6389 Other cerebral infarction: Secondary | ICD-10-CM | POA: Diagnosis not present

## 2017-06-02 DIAGNOSIS — I5042 Chronic combined systolic (congestive) and diastolic (congestive) heart failure: Secondary | ICD-10-CM | POA: Diagnosis present

## 2017-06-02 DIAGNOSIS — I2510 Atherosclerotic heart disease of native coronary artery without angina pectoris: Secondary | ICD-10-CM | POA: Diagnosis present

## 2017-06-02 DIAGNOSIS — Z79899 Other long term (current) drug therapy: Secondary | ICD-10-CM | POA: Diagnosis not present

## 2017-06-02 DIAGNOSIS — Z9842 Cataract extraction status, left eye: Secondary | ICD-10-CM | POA: Diagnosis not present

## 2017-06-02 DIAGNOSIS — R479 Unspecified speech disturbances: Secondary | ICD-10-CM | POA: Diagnosis not present

## 2017-06-02 DIAGNOSIS — Z9861 Coronary angioplasty status: Secondary | ICD-10-CM | POA: Diagnosis not present

## 2017-06-02 DIAGNOSIS — Z85828 Personal history of other malignant neoplasm of skin: Secondary | ICD-10-CM | POA: Diagnosis not present

## 2017-06-02 DIAGNOSIS — Z7982 Long term (current) use of aspirin: Secondary | ICD-10-CM | POA: Diagnosis not present

## 2017-06-02 DIAGNOSIS — I252 Old myocardial infarction: Secondary | ICD-10-CM | POA: Diagnosis not present

## 2017-06-02 DIAGNOSIS — C9190 Lymphoid leukemia, unspecified not having achieved remission: Secondary | ICD-10-CM | POA: Diagnosis not present

## 2017-06-02 DIAGNOSIS — D509 Iron deficiency anemia, unspecified: Secondary | ICD-10-CM | POA: Diagnosis present

## 2017-06-02 DIAGNOSIS — R4781 Slurred speech: Secondary | ICD-10-CM | POA: Diagnosis present

## 2017-06-02 DIAGNOSIS — Z961 Presence of intraocular lens: Secondary | ICD-10-CM | POA: Diagnosis present

## 2017-06-02 DIAGNOSIS — I420 Dilated cardiomyopathy: Secondary | ICD-10-CM | POA: Diagnosis present

## 2017-06-02 LAB — BASIC METABOLIC PANEL
Anion gap: 10 (ref 5–15)
BUN: 16 mg/dL (ref 6–20)
CHLORIDE: 111 mmol/L (ref 101–111)
CO2: 21 mmol/L — AB (ref 22–32)
Calcium: 8.7 mg/dL — ABNORMAL LOW (ref 8.9–10.3)
Creatinine, Ser: 1.06 mg/dL (ref 0.61–1.24)
GFR calc Af Amer: >60 mL/min (ref >60)
GFR calc non Af Amer: >60 mL/min (ref >60)
GLUCOSE: 159 mg/dL — AB (ref 65–99)
POTASSIUM: 4.1 mmol/L (ref 3.5–5.1)
Sodium: 142 mmol/L (ref 135–145)

## 2017-06-02 LAB — CBC
HEMATOCRIT: 36 % — AB (ref 39.0–52.0)
Hemoglobin: 11.5 g/dL — ABNORMAL LOW (ref 13.0–17.0)
MCH: 33.7 pg (ref 26.0–34.0)
MCHC: 31.9 g/dL (ref 30.0–36.0)
MCV: 105.6 fL — AB (ref 78.0–100.0)
Platelets: 145 10*3/uL — ABNORMAL LOW (ref 150–400)
RBC: 3.41 MIL/uL — ABNORMAL LOW (ref 4.22–5.81)
RDW: 13.6 % (ref 11.5–15.5)
WBC: 38.9 10*3/uL — ABNORMAL HIGH (ref 4.0–10.5)

## 2017-06-02 LAB — HEPARIN LEVEL (UNFRACTIONATED): Heparin Unfractionated: 0.55 IU/mL (ref 0.30–0.70)

## 2017-06-02 MED ORDER — LORATADINE 10 MG PO TABS
10.0000 mg | ORAL_TABLET | Freq: Every day | ORAL | Status: DC
  Administered 2017-06-03 – 2017-06-04 (×2): 10 mg via ORAL
  Filled 2017-06-02 (×2): qty 1

## 2017-06-02 MED ORDER — SALINE SPRAY 0.65 % NA SOLN
1.0000 | Freq: Two times a day (BID) | NASAL | Status: DC
  Administered 2017-06-02 – 2017-06-04 (×2): 1 via NASAL
  Filled 2017-06-02: qty 44

## 2017-06-02 NOTE — Progress Notes (Signed)
STROKE TEAM PROGRESS NOTE   HISTORY OF PRESENT ILLNESS (per record) Gregory Rodgers is an 84 y.o. 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 He also had transient left eye vision loss the day prior to arrival.  Patient is accompanied by his wife and daughters who assist with the history. They report that he has had occasional problems "finding words" for more than a year, but has been more frequent over the past week. Wife states that even before yesterday while at church patient had sudden onset left eye vision loss which he complained only after the got in the car. The patient is a poor historian but states that he remembers losing vision in the left eye is slightly painful. He denies headache, jaw claudication, joint pains. He has similar episode of vision loss in his left eye that lasted for a few days over a year ago. Today patient was wife are getting out of the car he seemed confused. He tried to talk with the words makes no sense. She got him into the house and noticed that he was dragging his right leg more than usual. Otherwise no facial droop was seen, it did not appear his right any extremity was weaker than normal. She called her daughter will try to talk to him on the phone but he was confused and not making sense and it was decided to call EMS.  He had a pacemaker placed in September 2018. St[employer] Jude (serial # [id number] ) right atrial lead and a [employer] Jude (serial number [id number]) right ventricular defibrillator lead St. Jude(serial number [id number] [employer]. Jude (serial Number [id number]) biventricular ICD placed after he had symptomatic NSVT.    Date last known well: 2.14.19 tPA Given: no, outside window NIHSS: 1 Baseline MRS 0   SUBJECTIVE (INTERVAL HISTORY) His wife and daughters are at the  bedside. He has had a loop placed in the past and now has a defibrillator. Discussed findings, answered all questions. At home was on a baby aspirin now 325mg . Need to interrogate the defibrillator, pending.    OBJECTIVE Temp:  [97.5 F (36.4 C)-98.7 F (37.1 C)] 98.4 F (36.9 C) (02/17 0529) Pulse Rate:  [49-59] 54 (02/17 0529) Cardiac Rhythm: Bundle branch block (02/16 1915) Resp:  [16-20] 20 (02/17 0529) BP: (117-161)/(51-87) 136/87 (02/17 0529) SpO2:  [95 %-98 %] 97 % (02/17 0529)  CBC:  Recent Labs  Lab 06/01/17 0452  WBC 40.9*  NEUTROABS 2.0  HGB 10.4*  HCT 32.5*  MCV 104.8*  PLT 142*    Basic Metabolic Panel:  Recent Labs  Lab 06/01/17 0452  NA 140  K 4.1  CL 109  CO2 22  GLUCOSE 106*  BUN 20  CREATININE 1.03  CALCIUM 8.4*    Lipid Panel:     Component Value Date/Time   CHOL 134 06/01/2017 0452   TRIG 80 06/01/2017 0452   HDL 37 (L) 06/01/2017 0452   CHOLHDL 3.6 06/01/2017 0452   VLDL 16 06/01/2017 0452   LDLCALC 81 06/01/2017 0452   HgbA1c:  Lab Results  Component Value Date   HGBA1C 6.0 (H) 06/01/2017   Urine Drug Screen: No results found for: LABOPIA, COCAINSCRNUR, LABBENZ, AMPHETMU, THCU, LABBARB  Alcohol Level No results found for: Gregory Rodgers  IMAGING   Dg Chest 2 View 06/01/2017 IMPRESSION:  Lower lung volumes otherwise stable exam. No superimposed  acute chest process.    Mr Gregory Rodgers Neck W Wo Contrast Mr Gregory Rodgers Wo Contrast 06/01/2017   IMPRESSION:  Multiple small acute infarcts in the left MCA territory.  Irregular plaque proximal left internal carotid artery with 60% diameter narrowing.  Filling defect in the left carotid bulb compatible with thrombus.  This is likely a cause of embolic infarctions in the left MCA territory.  Atrophy and chronic microvascular ischemia.  No intracranial large vessel occlusion.     Transthoracic Echocardiogram  06/01/2017 Study Conclusions - Left ventricle: The cavity size was normal. There was  severe   focal basal hypertrophy of the septum with otherwise mild   concentric hypertrophy. Systolic function was mildly to   moderately reduced. The estimated ejection fraction was in the   range of 40% to 45%. Wall motion was normal; there were no   regional wall motion abnormalities. Doppler parameters are   consistent with abnormal left ventricular relaxation (grade 1   diastolic dysfunction). - Aortic valve: Transvalvular velocity was within the normal range.   There was no stenosis. There was moderate regurgitation.   Regurgitation pressure half-time: 348 ms. - Aorta: Ascending aortic diameter: 42 mm (S). - Ascending aorta: The ascending aorta was mildly dilated. - Mitral valve: Transvalvular velocity was within the normal range.   There was no evidence for stenosis. There was mild regurgitation. - Left atrium: The atrium was mildly dilated. - Right ventricle: The cavity size was normal. Wall thickness was   normal. Systolic function was normal. - Tricuspid valve: There was trivial regurgitation. Impressions: - Compared with the prior echo 73/2202, systolic function has   improved. The ascending aortic aneurysm is unchanged.    PHYSICAL EXAM Vitals:   06/01/17 1748 06/01/17 2138 06/02/17 0107 06/02/17 0529  BP: (!) 161/68 (!) 153/55 (!) 126/51 136/87  Pulse: (!) 56 (!) 59 (!) 55 (!) 54  Resp: 17 18 18 20   Temp: 98.6 F (37 C) 98.7 F (37.1 C) 98.6 F (37 C) 98.4 F (36.9 C)  TempSrc: Oral Oral Oral Oral  SpO2: 97% 95% 96% 97%  Weight:      Height:       PHYSICAL EXAM Physical exam: Exam: Gen: NAD Eyes: anicteric sclerae, moist conjunctivae                    CV: no MRG, no carotid bruits, no peripheral edema Mental Status: Alert, follows commands  Neuro: Detailed Neurologic Exam  Speech:    No aphasia, no dysarthria  Cranial Nerves:    The pupils are equal, round, and reactive to light.. Attempted, Fundi not visualized.  EOMI. No gaze preference. Visual  fields full. Face symmetric, Tongue midline. Hearing intact to voice. Shoulder shrug intact  Motor Observation:    no involuntary movements noted. Tone appears normal.     Strength:    5/5     Sensation:  Intact to LT  Plantars equiv .      ASSESSMENT/PLAN [name] is a 84 y.o. male with history of coronary artery disease with previous MI, left bundle branch block, borderline diabetes, hyperlipidemia, history of renal calculi, hypertension, chronic lymphocytic leukemia, CHF, cardiomyopathy with symptomatic nonsustained V. tach, and status post placement of a biventricular pacemaker/AICD presenting with transient speech problems, confusion, transient visual loss, and right lower extremity weakness. He did not receive IV t-PA due to late presentation.  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  Resultant  Resolution of symptoms  CT head - not performed  MRI head - Multiple small acute infarcts in the left MCA territory.  MRA head - No intracranial large vessel occlusion.  MRA neck - Filling defect in the left carotid bulb compatible with thrombus.  Carotid Doppler - MRA neck  2D Echo - EF 40-45%. No cardiac source of emboli identified.  LDL - 81  HgbA1c - 6.0  VTE prophylaxis - intravenous heparin  Fall precautions  Diet Heart Room service appropriate? Yes; Fluid consistency: Thin  aspirin 81 mg daily prior to admission, now on aspirin 325 mg daily and Heparin drip due to thrombus  Patient counseled to be compliant with his antithrombotic medications  Ongoing aggressive stroke risk factor management  Therapy recommendations:  pending  Disposition:  Pending  Hypertension  Stable  Permissive hypertension (OK if < 220/120) but gradually normalize in 5-7 days  Long-term BP goal normotensive  Hyperlipidemia  Home meds: No lipid lowering medications prior to admission  LDL 81, goal < 70  Now  on Lipitor 40 mg daily  Continue statin at discharge  Diabetes (borderline)  HgbA1c 6.0, goal < 7.0  Controlled  Other Stroke Risk Factors  Advanced age  Possible TIA history  Coronary artery disease   Other Active Problems  Cardiomyopathy / congestive heart failure / status post permanent pacemaker AICD  Blood cultures and urine culture pending  Anemia - chronic lymphocytic leukemia - WBCs - 40.9 - afebrile   Plan / Recommendations   Stroke workup  Lt ICA thrombus -> IV heparin per pharmacy -> cerebral angiogram Dr Estanislado Pandy Monday  Pending interrogation of [facility] day # 0  Personally examined patient and images, and have participated in and made any corrections needed to history, physical, neuro exam,assessment and plan as stated above.  I have personally obtained the history, evaluated lab date, reviewed imaging studies and agree with radiology interpretations.    Sarina Ill, MD Stroke Neurology  To contact Stroke Continuity provider, please refer to [email or web address] After hours, contact General Neurology

## 2017-06-02 NOTE — Progress Notes (Signed)
Physical Therapy Treatment Patient Details Name: Gregory Rodgers MRN: [id number] DOB: 1927/07/02 Today's Date: 06/02/2017    History of Present Illness Pt is an 84 y.o. male with PMH significant for hypertension, coronary artery disease, and chronic combined systolic and diastolic CHF with ICD. Had pacemaker placed in September 2018. He presented to the ED with transient word finding deficits and intermittent sudden onset L eye vision loss. MRI/MRA of the brain showed multiple small acute infarcts in the left MCA territory, irregular plaque proximal left ICA with 60% diameter narrowing. Filling defect in the left carotid bulb compatible with thrombus, likely cause of embolic infarctions in the left. No intracranial large vessel occlusion    PT Comments    Pt is progressing towards his goals, however is limited by decreased safety awareness, decreased balance, and decreased awareness of DME use. Pt is currently minA for transfers, and min/modA for ambulation of 600 feet with RW, and minA for ascent/descent of 3 steps. Pt family educated on fact that although pt is used to ambulating without an assistive device his balance is decreased and he needs to use RW and have hands on assist to prevent falls. PT continues to recommend HHPT with family assurance that he will have 24 hr supervision. PT will continue to follow acutely.     Follow Up Recommendations  Home health PT;Supervision/Assistance - 24 hour     Equipment Recommendations  None recommended by PT       Precautions / Restrictions Precautions Precautions: Fall;ICD/Pacemaker Restrictions Weight Bearing Restrictions: No    Mobility  Bed Mobility               General bed mobility comments: OOB in chair at entry  Transfers Overall transfer level: Needs assistance Equipment used: 1 person hand held assist;Rolling walker (2 wheeled) Transfers: Sit to/from Stand Sit to Stand: Min assist         General transfer comment:  Min assist for power up and steadying in RW  Ambulation/Gait Ambulation/Gait assistance: Min assist;Mod assist Ambulation Distance (Feet): 600 Feet Assistive device: Rolling walker (2 wheeled) Gait Pattern/deviations: Narrow base of support;Staggering left;Staggering right;Step-through pattern;Shuffle;Scissoring Gait velocity: decreased Gait velocity interpretation: Below normal speed for age/gender General Gait Details: minA for steadying with RW, 2x modA for LoB with scissoring, drags L foot at base line due to wife reported leg length discrepancy, currently pt also has decrease R foot clearance vc for proximity to RW, picking up his feet and wider BoS, family educated on need to always walk with him due to his instability and decreased safety awareness    Stairs Stairs: Yes   Stair Management: Two rails;Forwards;Alternating pattern Number of Stairs: 3 General stair comments: minA for steadying with stairs, vc for increased foot clearance  Wheelchair Mobility    Modified Rankin (Stroke Patients Only) Modified Rankin (Stroke Patients Only) Pre-Morbid Rankin Score: Slight disability Modified Rankin: Moderately severe disability     Balance Overall balance assessment: Needs assistance Sitting-balance support: No upper extremity supported;Feet supported Sitting balance-Leahy Scale: Fair     Standing balance support: No upper extremity supported;Single extremity supported;During functional activity Standing balance-Leahy Scale: Poor Standing balance comment: Relies on single UE support.                             Cognition Arousal/Alertness: Awake/alert Behavior During Therapy: WFL for tasks assessed/performed Overall Cognitive Status: Difficult to assess Area of Impairment: Safety/judgement  Safety/Judgement: Decreased awareness of safety;Decreased awareness of deficits     General Comments: Pt slow to respond at times but  difficult to fully assess cognition due to expressive deficits.       Exercises      General Comments General comments (skin integrity, edema, etc.): wife and daughters present throughout session       Pertinent Vitals/Pain  denies pain            PT Goals (current goals can now be found in the care plan section) Acute Rehab PT Goals Patient Stated Goal: go home and get back to exercising PT Goal Formulation: With patient/family Time For Goal Achievement: 06/08/17 Potential to Achieve Goals: Good    Frequency    Min 4X/week      PT Plan Current plan remains appropriate       AM-PAC PT "6 Clicks" Daily Activity  Outcome Measure  Difficulty turning over in bed (including adjusting bedclothes, sheets and blankets)?: A Little Difficulty moving from lying on back to sitting on the side of the bed? : A Little Difficulty sitting down on and standing up from a chair with arms (e.g., wheelchair, bedside commode, etc,.)?: Unable Help needed moving to and from a bed to chair (including a wheelchair)?: A Little Help needed walking in hospital room?: A Little Help needed climbing 3-5 steps with a railing? : A Little 6 Click Score: 16    End of Session Equipment Utilized During Treatment: Gait belt Activity Tolerance: Patient tolerated treatment well Patient left: in chair;with call bell/phone within reach;with family/visitor present Nurse Communication: Mobility status PT Visit Diagnosis: Unsteadiness on feet (R26.81);Other abnormalities of gait and mobility (R26.89);Other symptoms and signs involving the nervous system (R29.898);Muscle weakness (generalized) (M62.81);Difficulty in walking, not elsewhere classified (R26.2)     Time: 2595-6387 PT Time Calculation (min) (ACUTE ONLY): 39 min  Charges:  $Gait Training: 38-52 mins                    G Codes:       Temima Kutsch B. Migdalia Dk PT, DPT Acute Rehabilitation  [phone] Pager [phone]     [place] 06/02/2017, 5:20 PM

## 2017-06-02 NOTE — Care Management Note (Signed)
Case Management Note  Patient Details  Name: Gregory Rodgers MRN: [id number] Date of Birth: [birth year]  Subjective/Objective:        Pt from home with wife for speech disturbance, unsteady gait and poor balance. Pt diagnosed with CVA. Pt has 2 daughters that are also supportive and available for assistance.  Pt has 3n1 and RW at home but was independent PTA.   Pt and family desire HH PT and OT at d/c.  Pt has used a [facility] agency in the distant past and they are not sure of the name.   Action/Plan: Pt and wife feel that he does not need any other DME at home.  [facility] list presented to pt and family.  They want to consider [facility] agency options before making an agency choice.  Expected Discharge Date:  06/02/17               Expected Discharge Plan:  [place]  In-House Referral:  NA  Discharge planning Services  CM Consult  Post Acute Care Choice:  Durable Medical Equipment Choice offered to:  Patient, Spouse, Adult Children  DME Arranged:  N/A pt has necessary equipment DME Agency:  NA  HH Arranged:  PT, OT [place] Agency:     Status of Service:  In process, will continue to follow  If discussed at Long Length of Stay Meetings, dates discussed:    Additional Comments:  Claudie [name], RN 06/02/2017, 11:33 AM

## 2017-06-02 NOTE — Consult Note (Signed)
Chief Complaint: CVA with L ICA thrombus  Referring Physician:Dr. Sarina Ill  Supervising Physician: Luanne Bras  Patient Status: [facility] - In-pt  HPI: Gregory Rodgers is a 84 y.o. male who has multiple medical problems who began having slurred speech and transient right eye blindness.  His daughters states that he has had some speech issues for the last year or so at baseline.  Upon arrival he had a head CT that was negative.  He was admitted for further work up.  He underwent an MRA of the head and neck which revealed left MCA territory small infarcts.  There also appeared to be a left carotid bulb compatible with thrombus, which is felt to be source of the infarcts. NIR has been asked to see the patient for cerebral angiogram.  Past Medical History:  Past Medical History:  Diagnosis Date  . Basal cell carcinoma    "numerous places"  . Borderline diabetes   . 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 CLL (chronic lymphocytic leukemia) ([place]) 09/2009   stage 0; on observation  . Coronary artery disease involving native heart without angina pectoris    PTCA of RCA in setting of an MI in 1986-87; no follow-up study since available.  . Essential hypertension   . History of kidney stones   . Hyperlipidemia with target LDL less than 70    With known CAD  . Left bundle branch block (LBBB) on electrocardiogram    Noted as far back as 2008.  . Old inferior wall myocardial infarction 1986/87   Had PTCA most likely of the RCA    Past Surgical History:  Past Surgical History:  Procedure Laterality Date  . BASAL CELL CARCINOMA EXCISION     "numerous"  . BIV ICD INSERTION CRT-D N/A 01/09/2017   Procedure: BIV ICD INSERTION CRT-D;  Surgeon: Evans Lance, MD;  Location: [facility];  Service: Cardiovascular;  Laterality: N/A;  . CATARACT EXTRACTION W/ INTRAOCULAR LENS  IMPLANT, BILATERAL Bilateral   . CORONARY ANGIOPLASTY  1986/1987  . EP IMPLANTABLE  DEVICE N/A 05/10/2016   Procedure: Loop Recorder Insertion;  Surgeon: Evans Lance, MD;  Location: [facility];  Service: Cardiovascular;  Laterality: N/A;  slight blood drop on dressing  . JOINT REPLACEMENT    . MOHS SURGERY Left 01/02/2017   ear  . REPLACEMENT TOTAL KNEE Left   . TRANSTHORACIC ECHOCARDIOGRAM  03/2016   Normal LV size with mild LV hypertrophy. EF 30%. Diffuse  hypokinesis wtih septal-lateral dyssynchrony. Normal RV size and systolic function. Mild MR and mild AI.    Family History:  Family History  Problem Relation Age of Onset  . Sudden Cardiac Death Neg Hx     Social History:  reports that  has never smoked. he has never used smokeless tobacco. He reports that he does not drink alcohol or use drugs.  Allergies:  Allergies  Allergen Reactions  . Tape Other (See Comments)    "PLASTIC" TAPE PULLS OFF THE SKIN AND THE REMOVAL OF ALL TAPES BRUISE HIM!!!    Medications: Medications reviewed in epic  Please HPI for pertinent positives, otherwise complete 10 system ROS negative.  Mallampati Score: MD Evaluation Airway: WNL Heart: WNL Abdomen: WNL Chest/ Lungs: WNL ASA  Classification: 3 Mallampati/Airway Score: Two  Physical Exam: BP (!) 120/57 (BP Location: Right Arm)   Pulse 86   Temp 97.8 F (36.6 C) (Oral)   Resp 16   Ht  6' (1.829 m)   Wt 177 lb 11.1 oz (80.6 kg)   SpO2 97%   BMI 24.10 kg/m  Body mass index is 24.1 kg/m. General: pleasant, edlerly, WD, WN white male who is sitting in a chair in NAD HEENT: head is normocephalic, atraumatic.  Sclera are noninjected.  PERRL.  Ears and nose without any masses or lesions.  Mouth is pink and moist Heart: regular, rate, and rhythm.  Normal s1,s2. No obvious murmurs, gallops, or rubs noted.  Palpable radial pulses bilaterally Lungs: CTAB, no wheezes, rhonchi, or rales noted.  Respiratory effort nonlabored Abd: soft, NT, ND, +BS, no masses, hernias, or organomegaly Psych: A&Ox3 with an  appropriate affect.   Labs: Results for orders placed or performed during the hospital encounter of 06/01/17 (from the past 48 hour(s))  Hemoglobin A1c     Status: Abnormal   Collection Time: 06/01/17  4:52 AM  Result Value Ref Range   Hgb A1c MFr Bld 6.0 (H) 4.8 - 5.6 %    Comment: (NOTE) Pre diabetes:          5.7%-6.4% Diabetes:              >6.4% Glycemic control for   <7.0% adults with diabetes    Mean Plasma Glucose 125.5 mg/dL    Comment: Performed at [facility], [place] [address], [place], [place] [address]  Lipid panel     Status: Abnormal   Collection Time: 06/01/17  4:52 AM  Result Value Ref Range   Cholesterol 134 0 - 200 mg/dL   Triglycerides 80 <150 mg/dL   HDL 37 (L) >40 mg/dL   Total CHOL/HDL Ratio 3.6 RATIO   VLDL 16 0 - 40 mg/dL   LDL Cholesterol 81 0 - 99 mg/dL    Comment:        Total Cholesterol/HDL:CHD Risk Coronary Heart Disease Risk Table                     Men   Women  1/2 Average Risk   3.4   3.3  Average Risk       5.0   4.4  2 X Average Risk   9.6   7.1  3 X Average Risk  23.4   11.0        Use the calculated Patient Ratio above and the CHD Risk Table to determine the patient's CHD Risk.        ATP III CLASSIFICATION (LDL):  <100     mg/dL   Optimal  100-129  mg/dL   Near or Above                    Optimal  130-159  mg/dL   Borderline  160-189  mg/dL   High  >190     mg/dL   Very High Performed at [facility] [address] [place], [place] [address]   Ammonia     Status: None   Collection Time: 06/01/17  4:52 AM  Result Value Ref Range   Ammonia 31 9 - 35 umol/L    Comment: Performed at [facility], [place] [address] [place], [place] [address]  Vitamin B12     Status: None   Collection Time: 06/01/17  4:52 AM  Result Value Ref Range   Vitamin B-12 369 180 - 914 pg/mL    Comment: (NOTE) This assay is not validated for testing neonatal or myeloproliferative syndrome specimens for  Vitamin B12 levels. Performed  at [facility], [place] [address] [place], [place] [address]   RPR     Status: None   Collection Time: 06/01/17  4:52 AM  Result Value Ref Range   RPR Ser Ql Non Reactive Non Reactive    Comment: (NOTE) Performed At: [facility] [place], [place] [id number] Rush Farmer MD [id number] Performed at [facility], [place] [address], [place], [place] [address]   TSH     Status: None   Collection Time: 06/01/17  4:52 AM  Result Value Ref Range   TSH 2.095 0.350 - 4.500 uIU/mL    Comment: Performed by a 3rd Generation assay with a functional sensitivity of <=0.01 uIU/mL. Performed at [facility], [place] [address], [place], [place] [address]   CBC with Differential/Platelet     Status: Abnormal   Collection Time: 06/01/17  4:52 AM  Result Value Ref Range   WBC 40.9 (H) 4.0 - 10.5 K/uL    Comment: REPEATED TO VERIFY WHITE COUNT CONFIRMED ON SMEAR    RBC 3.10 (L) 4.22 - 5.81 MIL/uL   Hemoglobin 10.4 (L) 13.0 - 17.0 g/dL   HCT 32.5 (L) 39.0 - 52.0 %   MCV 104.8 (H) 78.0 - 100.0 fL   MCH 33.5 26.0 - 34.0 pg   MCHC 32.0 30.0 - 36.0 g/dL   RDW 14.0 11.5 - 15.5 %   Platelets 142 (L) 150 - 400 K/uL   Neutrophils Relative % 5 %   Lymphocytes Relative 92 %   Monocytes Relative 3 %   Eosinophils Relative 0 %   Basophils Relative 0 %   Neutro Abs 2.0 1.7 - 7.7 K/uL   Lymphs Abs 37.7 (H) 0.7 - 4.0 K/uL   Monocytes Absolute 1.2 (H) 0.1 - 1.0 K/uL   Eosinophils Absolute 0.0 0.0 - 0.7 K/uL   Basophils Absolute 0.0 0.0 - 0.1 K/uL   WBC Morphology ABSOLUTE LYMPHOCYTOSIS     Comment: ATYPICAL LYMPHOCYTES SMUDGE CELLS Performed at [facility], [address] [place], [place] [address]   Comprehensive metabolic panel     Status: Abnormal   Collection Time: 06/01/17  4:52 AM  Result Value Ref Range   Sodium 140 135 - 145 mmol/L   Potassium 4.1 3.5 - 5.1 mmol/L   Chloride 109 101 - 111 mmol/L   CO2 22 22 - 32 mmol/L   Glucose, Bld 106 (H) 65  - 99 mg/dL   BUN 20 6 - 20 mg/dL   Creatinine, Ser 1.03 0.61 - 1.24 mg/dL   Calcium 8.4 (L) 8.9 - 10.3 mg/dL   Total Protein 4.9 (L) 6.5 - 8.1 g/dL   Albumin 3.2 (L) 3.5 - 5.0 g/dL   AST 18 15 - 41 U/L   ALT 12 (L) 17 - 63 U/L   Alkaline Phosphatase 59 38 - 126 U/L   Total Bilirubin 0.7 0.3 - 1.2 mg/dL   GFR calc non Af Amer >60 >60 mL/min   GFR calc Af Amer >60 >60 mL/min    Comment: (NOTE) The eGFR has been calculated using the CKD EPI equation. This calculation has not been validated in all clinical situations. eGFR's persistently <60 mL/min signify possible Chronic Kidney Disease.    Anion gap 9 5 - 15    Comment: Performed at [facility] [address] [place], [place] [address]  Protime-INR     Status: None   Collection Time:  06/01/17  4:52 AM  Result Value Ref Range   Prothrombin Time 15.0 11.4 - 15.2 seconds   INR 1.19     Comment: Performed at [facility] [address] [place], [place] [address]  HIV antibody (routine testing) (NOT for [facility])     Status: None   Collection Time: 06/01/17  4:52 AM  Result Value Ref Range   HIV Screen 4th Generation wRfx Non Reactive Non Reactive    Comment: (NOTE) Performed At: [facility] [place], [place] [id number] Rush Farmer MD [id number] Performed at [facility], [place] [address] [place], [place] [address]   MRSA PCR Screening     Status: None   Collection Time: 06/01/17  6:18 AM  Result Value Ref Range   MRSA by PCR NEGATIVE NEGATIVE    Comment:        The GeneXpert MRSA Assay (FDA approved for NASAL specimens only), is one component of a comprehensive MRSA colonization surveillance program. It is not intended to diagnose MRSA infection nor to guide or monitor treatment for MRSA infections. Performed at [facility], [place] [address], [place], [place] [address]   Urinalysis, Routine w reflex microscopic     Status: Abnormal   Collection Time: 06/01/17  7:47 AM   Result Value Ref Range   Color, Urine STRAW (A) YELLOW   APPearance CLEAR CLEAR   Specific Gravity, Urine 1.008 1.005 - 1.030   pH 7.0 5.0 - 8.0   Glucose, UA NEGATIVE NEGATIVE mg/dL   Hgb urine dipstick SMALL (A) NEGATIVE   Bilirubin Urine NEGATIVE NEGATIVE   Ketones, ur NEGATIVE NEGATIVE mg/dL   Protein, ur NEGATIVE NEGATIVE mg/dL   Nitrite NEGATIVE NEGATIVE   Leukocytes, UA NEGATIVE NEGATIVE   RBC / HPF 0-5 0 - 5 RBC/hpf   WBC, UA 0-5 0 - 5 WBC/hpf   Bacteria, UA NONE SEEN NONE SEEN   Squamous Epithelial / LPF NONE SEEN NONE SEEN    Comment: Performed at [facility] [address] [place], [place] [address]  Culture, blood (routine x 2)     Status: None (Preliminary result)   Collection Time: 06/01/17  8:58 AM  Result Value Ref Range   Specimen Description BLOOD RIGHT ARM    Special Requests      BOTTLES DRAWN AEROBIC AND ANAEROBIC Blood Culture adequate volume   Culture      NO GROWTH 1 DAY Performed at [facility], [place] [address], [place], [place] [address]    Report Status PENDING   Culture, blood (routine x 2)     Status: None (Preliminary result)   Collection Time: 06/01/17  9:07 AM  Result Value Ref Range   Specimen Description BLOOD RIGHT ARM    Special Requests      BOTTLES DRAWN AEROBIC AND ANAEROBIC Blood Culture adequate volume   Culture      NO GROWTH 1 DAY Performed at [facility], [place] [address] [place], [place] [address]    Report Status PENDING   Glucose, capillary     Status: None   Collection Time: 06/01/17  9:24 PM  Result Value Ref Range   Glucose-Capillary 97 65 - 99 mg/dL   Comment 1 Notify RN    Comment 2 Document in Chart   Heparin level (unfractionated)     Status: Abnormal   Collection Time: 06/01/17 10:46 PM  Result Value Ref Range   Heparin Unfractionated 0.19 (  L) 0.30 - 0.70 IU/mL    Comment:        IF HEPARIN RESULTS ARE BELOW EXPECTED VALUES, AND PATIENT DOSAGE HAS BEEN CONFIRMED, SUGGEST FOLLOW UP  TESTING OF ANTITHROMBIN III LEVELS. Performed at [facility], [place] [address] [place], [place] [address]   Basic metabolic panel     Status: Abnormal   Collection Time: 06/02/17  9:54 AM  Result Value Ref Range   Sodium 142 135 - 145 mmol/L   Potassium 4.1 3.5 - 5.1 mmol/L   Chloride 111 101 - 111 mmol/L   CO2 21 (L) 22 - 32 mmol/L   Glucose, Bld 159 (H) 65 - 99 mg/dL   BUN 16 6 - 20 mg/dL   Creatinine, Ser 1.06 0.61 - 1.24 mg/dL   Calcium 8.7 (L) 8.9 - 10.3 mg/dL   GFR calc non Af Amer >60 >60 mL/min   GFR calc Af Amer >60 >60 mL/min    Comment: (NOTE) The eGFR has been calculated using the CKD EPI equation. This calculation has not been validated in all clinical situations. eGFR's persistently <60 mL/min signify possible Chronic Kidney Disease.    Anion gap 10 5 - 15    Comment: Performed at [facility] [address], [place], [place] [address]  CBC     Status: Abnormal   Collection Time: 06/02/17  9:54 AM  Result Value Ref Range   WBC 38.9 (H) 4.0 - 10.5 K/uL    Comment: CONSISTENT WITH PREVIOUS RESULT   RBC 3.41 (L) 4.22 - 5.81 MIL/uL   Hemoglobin 11.5 (L) 13.0 - 17.0 g/dL   HCT 36.0 (L) 39.0 - 52.0 %   MCV 105.6 (H) 78.0 - 100.0 fL   MCH 33.7 26.0 - 34.0 pg   MCHC 31.9 30.0 - 36.0 g/dL   RDW 13.6 11.5 - 15.5 %   Platelets 145 (L) 150 - 400 K/uL    Comment: Performed at [facility], [place] [address] [place], [place] [address]  Heparin level (unfractionated)     Status: None   Collection Time: 06/02/17  9:54 AM  Result Value Ref Range   Heparin Unfractionated 0.55 0.30 - 0.70 IU/mL    Comment:        IF HEPARIN RESULTS ARE BELOW EXPECTED VALUES, AND PATIENT DOSAGE HAS BEEN CONFIRMED, SUGGEST FOLLOW UP TESTING OF ANTITHROMBIN III LEVELS. Performed at [facility], [place] [address], [place], [place] [address]     Imaging: Dg Chest 2 View  Result Date: 06/01/2017 CLINICAL DATA:  Elevated white count EXAM: CHEST  2 VIEW COMPARISON:   05/31/2017 FINDINGS: Left subclavian pacemaker/AICD noted with 3 leads as before. Lower lung volumes accentuating the vascularity centrally. Mild cardiac enlargement. No current CHF or edema. No significant focal airspace process, collapse or consolidation. Negative for effusion or pneumothorax. Aorta is atherosclerotic and tortuous. Degenerative changes noted spine. IMPRESSION: Lower lung volumes otherwise stable exam. No superimposed acute chest process. Electronically Signed   By: Jerilynn Mages.  Shick M.D.   On: 06/01/2017 10:48   Mr [name] Neck W Wo Contrast  Addendum Date: 06/01/2017   ADDENDUM REPORT: 06/01/2017 13:51 ADDENDUM: These results were called by telephone at the time of interpretation on 06/01/2017 at 1:51 pm to Dr. Nira Conn RAI , who verbally acknowledged these results. Electronically Signed   By: Franchot Gallo M.D.   On: 06/01/2017 13:51   Result Date: 06/01/2017 CLINICAL DATA:  Stroke EXAM: MR HEAD WITHOUT CONTRAST MR CIRCLE  OF WILLIS WITHOUT CONTRAST MRA OF THE NECK WITHOUT AND WITH CONTRAST TECHNIQUE: Multiplanar, multiecho pulse sequences of the brain, circle of Willis and surrounding structures were obtained without intravenous contrast. Angiographic images of the neck were obtained using MRA technique without and with intravenous contrast. CONTRAST:  < 15 mL > MULTIHANCE GADOBENATE DIMEGLUMINE 529 MG/ML IV SOLN COMPARISON:  CT head 05/31/2017 FINDINGS: MR HEAD FINDINGS Brain: Multiple small areas of acute infarct in the left MCA territory. These involve the frontal and parietal white matter. Small acute areas of acute infarct in the left medial temporal lobe and in the left basal ganglia. No acute infarct on the right. Generalized atrophy. Negative for hydrocephalus. Mild chronic microvascular ischemic change in the white matter. Chronic infarcts in the cerebellum bilaterally. Negative for hemorrhage or mass. Vascular: Dilated and tortuous basilar artery. Normal arterial flow voids in the  circle-of-Willis. Skull and upper cervical spine: Negative Sinuses/Orbits: Mild mucosal edema paranasal sinuses. Bilateral cataract removal. Other: None MR CIRCLE OF WILLIS FINDINGS Dilatation of the distal left vertebral artery and the basilar which are tortuous compatible with hypertension and atherosclerotic disease. No significant basilar stenosis. Both vertebral arteries contribute to the basilar with left vertebral dominant. Superior cerebellar and posterior cerebral arteries patent bilaterally. PICA patent bilaterally. Internal carotid artery patent bilaterally without stenosis. Anterior and middle cerebral arteries patent without stenosis or large vessel occlusion. Negative for cerebral aneurysm. MRA NECK FINDINGS Antegrade flow in the carotid and vertebral arteries bilaterally Atherosclerotic plaque at the left carotid bifurcation with approximately 60% diameter stenosis left internal carotid artery. Filling defect in the proximal left internal carotid artery compatible with thrombus. This is likely a cause of embolic infarcts on the left. Right carotid bifurcation widely patent without stenosis Dominant left vertebral artery. No significant vertebral stenosis bilaterally. 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 Irregular plaque proximal left internal carotid artery with 60% diameter narrowing. Filling defect in the left carotid bulb compatible with thrombus. This is likely a cause of embolic infarctions in the left MCA territory. Atrophy and chronic microvascular ischemia. No intracranial large vessel occlusion. Electronically Signed: By: Franchot Gallo M.D. On: 06/01/2017 13:28   Mr Brain Wo Contrast  Addendum Date: 06/01/2017   ADDENDUM REPORT: 06/01/2017 13:51 ADDENDUM: These results were called by telephone at the time of interpretation on 06/01/2017 at 1:51 pm to Dr. Nira Conn RAI , who verbally acknowledged these results. Electronically Signed   By: Franchot Gallo M.D.    On: 06/01/2017 13:51   Result Date: 06/01/2017 CLINICAL DATA:  Stroke EXAM: MR HEAD WITHOUT CONTRAST MR CIRCLE OF WILLIS WITHOUT CONTRAST MRA OF THE NECK WITHOUT AND WITH CONTRAST TECHNIQUE: Multiplanar, multiecho pulse sequences of the brain, circle of Willis and surrounding structures were obtained without intravenous contrast. Angiographic images of the neck were obtained using MRA technique without and with intravenous contrast. CONTRAST:  < 15 mL > MULTIHANCE GADOBENATE DIMEGLUMINE 529 MG/ML IV SOLN COMPARISON:  CT head 05/31/2017 FINDINGS: MR HEAD FINDINGS Brain: Multiple small areas of acute infarct in the left MCA territory. These involve the frontal and parietal white matter. Small acute areas of acute infarct in the left medial temporal lobe and in the left basal ganglia. No acute infarct on the right. Generalized atrophy. Negative for hydrocephalus. Mild chronic microvascular ischemic change in the white matter. Chronic infarcts in the cerebellum bilaterally. Negative for hemorrhage or mass. Vascular: Dilated and tortuous basilar artery. Normal arterial flow voids in the circle-of-Willis. Skull and upper cervical  spine: Negative Sinuses/Orbits: Mild mucosal edema paranasal sinuses. Bilateral cataract removal. Other: None MR CIRCLE OF WILLIS FINDINGS Dilatation of the distal left vertebral artery and the basilar which are tortuous compatible with hypertension and atherosclerotic disease. No significant basilar stenosis. Both vertebral arteries contribute to the basilar with left vertebral dominant. Superior cerebellar and posterior cerebral arteries patent bilaterally. PICA patent bilaterally. Internal carotid artery patent bilaterally without stenosis. Anterior and middle cerebral arteries patent without stenosis or large vessel occlusion. Negative for cerebral aneurysm. MRA NECK FINDINGS Antegrade flow in the carotid and vertebral arteries bilaterally Atherosclerotic plaque at the left carotid  bifurcation with approximately 60% diameter stenosis left internal carotid artery. Filling defect in the proximal left internal carotid artery compatible with thrombus. This is likely a cause of embolic infarcts on the left. Right carotid bifurcation widely patent without stenosis Dominant left vertebral artery. No significant vertebral stenosis bilaterally. 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 Irregular plaque proximal left internal carotid artery with 60% diameter narrowing. Filling defect in the left carotid bulb compatible with thrombus. This is likely a cause of embolic infarctions in the left MCA territory. Atrophy and chronic microvascular ischemia. No intracranial large vessel occlusion. Electronically Signed: By: Franchot Gallo M.D. On: 06/01/2017 13:28   Mr [name] Head Wo Contrast  Addendum Date: 06/01/2017   ADDENDUM REPORT: 06/01/2017 13:51 ADDENDUM: These results were called by telephone at the time of interpretation on 06/01/2017 at 1:51 pm to Dr. Nira Conn RAI , who verbally acknowledged these results. Electronically Signed   By: Franchot Gallo M.D.   On: 06/01/2017 13:51   Result Date: 06/01/2017 CLINICAL DATA:  Stroke EXAM: MR HEAD WITHOUT CONTRAST MR CIRCLE OF WILLIS WITHOUT CONTRAST MRA OF THE NECK WITHOUT AND WITH CONTRAST TECHNIQUE: Multiplanar, multiecho pulse sequences of the brain, circle of Willis and surrounding structures were obtained without intravenous contrast. Angiographic images of the neck were obtained using MRA technique without and with intravenous contrast. CONTRAST:  < 15 mL > MULTIHANCE GADOBENATE DIMEGLUMINE 529 MG/ML IV SOLN COMPARISON:  CT head 05/31/2017 FINDINGS: MR HEAD FINDINGS Brain: Multiple small areas of acute infarct in the left MCA territory. These involve the frontal and parietal white matter. Small acute areas of acute infarct in the left medial temporal lobe and in the left basal ganglia. No acute infarct on the right.  Generalized atrophy. Negative for hydrocephalus. Mild chronic microvascular ischemic change in the white matter. Chronic infarcts in the cerebellum bilaterally. Negative for hemorrhage or mass. Vascular: Dilated and tortuous basilar artery. Normal arterial flow voids in the circle-of-Willis. Skull and upper cervical spine: Negative Sinuses/Orbits: Mild mucosal edema paranasal sinuses. Bilateral cataract removal. Other: None MR CIRCLE OF WILLIS FINDINGS Dilatation of the distal left vertebral artery and the basilar which are tortuous compatible with hypertension and atherosclerotic disease. No significant basilar stenosis. Both vertebral arteries contribute to the basilar with left vertebral dominant. Superior cerebellar and posterior cerebral arteries patent bilaterally. PICA patent bilaterally. Internal carotid artery patent bilaterally without stenosis. Anterior and middle cerebral arteries patent without stenosis or large vessel occlusion. Negative for cerebral aneurysm. MRA NECK FINDINGS Antegrade flow in the carotid and vertebral arteries bilaterally Atherosclerotic plaque at the left carotid bifurcation with approximately 60% diameter stenosis left internal carotid artery. Filling defect in the proximal left internal carotid artery compatible with thrombus. This is likely a cause of embolic infarcts on the left. Right carotid bifurcation widely patent without stenosis Dominant left vertebral artery. No significant vertebral stenosis bilaterally.  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 Irregular plaque proximal left internal carotid artery with 60% diameter narrowing. Filling defect in the left carotid bulb compatible with thrombus. This is likely a cause of embolic infarctions in the left MCA territory. Atrophy and chronic microvascular ischemia. No intracranial large vessel occlusion. Electronically Signed: By: Franchot Gallo M.D. On: 06/01/2017 13:28    Assessment/Plan 1. 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  The family is undecided on whether they want to proceed with this procedure at this time.  The patient was "traumatized" by the MRI yesterday because it was so loud and gave him a headache.  They are concerned about a procedure that requires sedation as well as the possibility of another procedure (TEE) that make also require moderate sedation.  They are just not sure if he can handle all of this.  I have gotten the patient ready to proceed on 2/18 if we are able to scheduling wise and IF the patient and family are agreeable to proceed.  His heparin drip will need to be held 30 minutes prior to the procedure if they are agreeable.  Thank you for this interesting consult.  I greatly enjoyed meeting AYMEN WIDRIG and look forward to participating in their care.  A copy of this report was sent to the requesting provider on this date.  Electronically Signed: Henreitta Cea 06/02/2017, 2:06 PM   I spent a total of 40 Minutes    in face to face in clinical consultation, greater than 50% of which was counseling/coordinating care for CVA

## 2017-06-02 NOTE — Progress Notes (Signed)
Gregory Rodgers for heparin Indication:   Carotid bulb artery thrombosis   Allergies  Allergen Reactions  . Tape Other (See Comments)    "PLASTIC" TAPE PULLS OFF THE SKIN AND THE REMOVAL OF ALL TAPES BRUISE HIM!!!    Patient Measurements: Height: 6' (182.9 cm) Weight: 177 lb 11.1 oz (80.6 kg) IBW/kg (Calculated) : 77.6 Heparin Dosing Weight: 80.6kg  Vital Signs: Temp: 97.8 F (36.6 C) (02/17 1017) Temp Source: Oral (02/17 1017) BP: 120/57 (02/17 1017) Pulse Rate: 86 (02/17 1017)  Labs: Recent Labs    06/01/17 0452 06/01/17 2246 06/02/17 0954  HGB 10.4*  --  11.5*  HCT 32.5*  --  36.0*  PLT 142*  --  145*  LABPROT 15.0  --   --   INR 1.19  --   --   HEPARINUNFRC  --  0.19* 0.55  CREATININE 1.03  --  1.06    Estimated Creatinine Clearance: 53.9 mL/min (by C-G formula based on SCr of 1.06 mg/dL).   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 Jules Husbands contacted and may do angiogram on Monday. Initial heparin level low, rate was increased and this morning's heparin level resulted at 0.55units/mL which is just above goal. Hgb and plts stable, no bleeding noted.   Goal of Therapy:  Heparin level 0.3-0.5 units/ml Monitor platelets by anticoagulation protocol: Yes   Plan:  Decrease Heparin slightly to 1150 units/hr Daily heparin level and CBC Follow for s/s bleeding Follow interventional plans  Moira Umholtz D. Bayani Renteria, PharmD, BCPS Clinical Pharmacist Clinical Phone for 06/02/2017 until 3:30pm: [phone] If after 3:30pm, please call main pharmacy at [phone] 06/02/2017 11:41 AM

## 2017-06-02 NOTE — Progress Notes (Signed)
[facility]                                                                              Patient Demographics  Gregory Rodgers, is a 84 y.o. male, DOB - [birth year], UXL:[id number]  Admit date - 06/01/2017   Admitting Physician Gregory Bulls, MD  Outpatient Primary MD for the patient is Gregory Overly, MD  Outpatient specialists:   LOS - 0  days   Medical records reviewed and are as summarized below:    No chief complaint on file.      Brief summary   Patient is a 84 year old male with hypertension, CAD, chronic combined systolic and diastolic CHF, with ICD pacemaker presented to ED with intermittent slurring of speech, transient right eye blindness. Patient reported occasional problems with finding words for more than a year but has been more frequent over the past week and acutely worse a day before the admission. Also experienced transient blindness in the right eye initially a couple of years ago then again a day before the admission. CT head negative. Patient was admitted for further workup.   Assessment & Plan    Principal Problem:   Transient speech disturbance - Presented with transient slurred speech and difficulty word finding, visual disturbance in the right eye - CT head negative for acute stroke - MRI/MRA of the brain showed multiple small acute infarcts in the left MCA territory, irregular plaque proximal left ICA with 60% diameter narrowing. Filling defect in the left carotid bulb compatible with thrombus, likely cause of embolic infarctions in the left. No intracranial large vessel occlusion Discussed with neurology and patient was started on IV heparin drip -  Dr.  Estanislado Pandy consulted by neurology, awaiting recommendations and will need possible cerebral angiogram - does not need carotid Dopplers with MRA neck - 2-D echo showed EF of 40-45%, normal wall motion, grade 1 diastolic dysfunction. Compared to prior echo in 53/66, systolic function  has improved, ascending aortic aneurysm is unchanged.  - LDL 81, started on Lipitor. 40 mg daily - Continue aspirin per neuro recommendations  Active Problems:   CLL (chronic lymphocytic leukemia) (HCC) -  CBC pending  - No infectious signs or symptoms.  UA negative for UTI. Chest x-ray showed no superimposed acute pathology per - Blood cultures negative so far     Essential hypertension - Currently stable, - Beta blocker, ACE inhibitor held due to acute stroke.      CAD S/P percutaneous coronary angioplasty,  Ischemic cardiomyopathy: EF 40-45%  now 30% - Continue aspirin, statin2-D echo showed EF of 40-45%, normal wall motion, grade 1 diastolic dysfunction. Compared to prior echo in 44/03, systolic function has improved, ascending aortic aneurysm is unchanged.    CODE STATUS: Full  DVT Prophylaxis:  Lovenox  Family Communication: Discussed in detail with the patient, all imaging results, lab results explained to the patient, wife and 2 daughters  at the bedside    Disposition Plan: Once the workup is complete  Time Spent in minutes: 25 minute   Procedures:  CT head MRI/MRA brain   2-D echo  Consultants:   neuro  Antimicrobials:  Medications  Scheduled Meds: . aspirin  300 mg Rectal Daily   Or  . aspirin  325 mg Oral Daily  . atorvastatin  40 mg Oral q1800  . multivitamin with minerals  1 tablet Oral Daily  . omega-3 acid ethyl esters  1 g Oral Daily   Continuous Infusions: . heparin 1,200 Units/hr (06/01/17 2341)   PRN Meds:.acetaminophen **OR** acetaminophen (TYLENOL) oral liquid 160 mg/5 mL **OR** acetaminophen, alum & mag hydroxide-simeth, senna-docusate   Antibiotics   Anti-infectives (From admission, onward)   None        Subjective:   Gregory Rodgers was seen and examined today. feels better today, speech is improving. Family at the bedside, confirmed. No chest pain or shortness of breath. No new weakness numbness or tingling. Patient  denies dizziness, chest pain, shortness of breath, abdominal pain, N/V/D/C. No acute events overnight.    Objective:   Vitals:   06/01/17 2138 06/02/17 0107 06/02/17 0529 06/02/17 1017  BP: (!) 153/55 (!) 126/51 136/87 (!) 120/57  Pulse: (!) 59 (!) 55 (!) 54 86  Resp: 18 18 20 16   Temp: 98.7 F (37.1 C) 98.6 F (37 C) 98.4 F (36.9 C) 97.8 F (36.6 C)  TempSrc: Oral Oral Oral Oral  SpO2: 95% 96% 97% 97%  Weight:      Height:        Intake/Output Summary (Last 24 hours) at 06/02/2017 1025 Last data filed at 06/02/2017 0530 Gross per 24 hour  Intake 120 ml  Output 1200 ml  Net -1080 ml     Wt Readings from Last 3 Encounters:  06/01/17 80.6 kg (177 lb 11.1 oz)  04/10/17 80.2 kg (176 lb 12.8 oz)  01/10/17 77.1 kg (169 lb 15.6 oz)     Exam   General: Alert and oriented x 3, NAD speech is slow (per wife this is baseline, as dysarthria has improved)  Eyes:   HEENT:  Atraumatic, normocephalic  Cardiovascular: S1 S2 clear, RRR  No pedal edema b/l  Respiratory: Clear to auscultation bilaterally, no wheezing, rales or rhonchi  Gastrointestinal: Soft, nontender, nondistended, + bowel sounds  Ext: no pedal edema bilaterally  Neuro: AAOx3, Cr N's II- XII. Strength 5/5 upper and lower extremities bilaterally  Musculoskeletal: No digital cyanosis, clubbing  Skin: No rashes  Psych: Normal affect and demeanor, alert and oriented x3    Data Reviewed:  I have personally reviewed following labs and imaging studies  Micro Results Recent Results (from the past 240 hour(s))  MRSA PCR Screening     Status: None   Collection Time: 06/01/17  6:18 AM  Result Value Ref Range Status   MRSA by PCR NEGATIVE NEGATIVE Final    Comment:        The GeneXpert MRSA Assay (FDA approved for NASAL specimens only), is one component of a comprehensive MRSA colonization surveillance program. It is not intended to diagnose MRSA infection nor to guide or monitor treatment for MRSA  infections. Performed at [facility], [place] [address], [place], [place] [address]     Radiology Reports Dg Chest 2 View  Result Date: 06/01/2017 CLINICAL DATA:  Elevated white count EXAM: CHEST  2 VIEW COMPARISON:  05/31/2017 FINDINGS: Left subclavian pacemaker/AICD noted with 3 leads as before. Lower lung volumes accentuating the vascularity centrally. Mild cardiac enlargement. No current CHF or edema. No significant focal airspace process, collapse or consolidation. Negative for effusion or pneumothorax. Aorta is atherosclerotic and tortuous. Degenerative changes noted spine. IMPRESSION: Lower lung  volumes otherwise stable exam. No superimposed acute chest process. Electronically Signed   By: Jerilynn Mages.  Shick M.D.   On: 06/01/2017 10:48   Mr [name] Neck W Wo Contrast  Addendum Date: 06/01/2017   ADDENDUM REPORT: 06/01/2017 13:51 ADDENDUM: These results were called by telephone at the time of interpretation on 06/01/2017 at 1:51 pm to Dr. Nira Conn Kasidee Voisin , who verbally acknowledged these results. Electronically Signed   By: Franchot Gallo M.D.   On: 06/01/2017 13:51   Result Date: 06/01/2017 CLINICAL DATA:  Stroke EXAM: MR HEAD WITHOUT CONTRAST MR CIRCLE OF WILLIS WITHOUT CONTRAST MRA OF THE NECK WITHOUT AND WITH CONTRAST TECHNIQUE: Multiplanar, multiecho pulse sequences of the brain, circle of Willis and surrounding structures were obtained without intravenous contrast. Angiographic images of the neck were obtained using MRA technique without and with intravenous contrast. CONTRAST:  < 15 mL > MULTIHANCE GADOBENATE DIMEGLUMINE 529 MG/ML IV SOLN COMPARISON:  CT head 05/31/2017 FINDINGS: MR HEAD FINDINGS Brain: Multiple small areas of acute infarct in the left MCA territory. These involve the frontal and parietal white matter. Small acute areas of acute infarct in the left medial temporal lobe and in the left basal ganglia. No acute infarct on the right. Generalized atrophy. Negative for hydrocephalus. Mild  chronic microvascular ischemic change in the white matter. Chronic infarcts in the cerebellum bilaterally. Negative for hemorrhage or mass. Vascular: Dilated and tortuous basilar artery. Normal arterial flow voids in the circle-of-Willis. Skull and upper cervical spine: Negative Sinuses/Orbits: Mild mucosal edema paranasal sinuses. Bilateral cataract removal. Other: None MR CIRCLE OF WILLIS FINDINGS Dilatation of the distal left vertebral artery and the basilar which are tortuous compatible with hypertension and atherosclerotic disease. No significant basilar stenosis. Both vertebral arteries contribute to the basilar with left vertebral dominant. Superior cerebellar and posterior cerebral arteries patent bilaterally. PICA patent bilaterally. Internal carotid artery patent bilaterally without stenosis. Anterior and middle cerebral arteries patent without stenosis or large vessel occlusion. Negative for cerebral aneurysm. MRA NECK FINDINGS Antegrade flow in the carotid and vertebral arteries bilaterally Atherosclerotic plaque at the left carotid bifurcation with approximately 60% diameter stenosis left internal carotid artery. Filling defect in the proximal left internal carotid artery compatible with thrombus. This is likely a cause of embolic infarcts on the left. Right carotid bifurcation widely patent without stenosis Dominant left vertebral artery. No significant vertebral stenosis bilaterally. 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 Irregular plaque proximal left internal carotid artery with 60% diameter narrowing. Filling defect in the left carotid bulb compatible with thrombus. This is likely a cause of embolic infarctions in the left MCA territory. Atrophy and chronic microvascular ischemia. No intracranial large vessel occlusion. Electronically Signed: By: Franchot Gallo M.D. On: 06/01/2017 13:28   Mr Brain Wo Contrast  Addendum Date: 06/01/2017   ADDENDUM REPORT: 06/01/2017  13:51 ADDENDUM: These results were called by telephone at the time of interpretation on 06/01/2017 at 1:51 pm to Dr. Nira Conn Holleigh Crihfield , who verbally acknowledged these results. Electronically Signed   By: Franchot Gallo M.D.   On: 06/01/2017 13:51   Result Date: 06/01/2017 CLINICAL DATA:  Stroke EXAM: MR HEAD WITHOUT CONTRAST MR CIRCLE OF WILLIS WITHOUT CONTRAST MRA OF THE NECK WITHOUT AND WITH CONTRAST TECHNIQUE: Multiplanar, multiecho pulse sequences of the brain, circle of Willis and surrounding structures were obtained without intravenous contrast. Angiographic images of the neck were obtained using MRA technique without and with intravenous contrast. CONTRAST:  < 15 mL > MULTIHANCE GADOBENATE DIMEGLUMINE 529  MG/ML IV SOLN COMPARISON:  CT head 05/31/2017 FINDINGS: MR HEAD FINDINGS Brain: Multiple small areas of acute infarct in the left MCA territory. These involve the frontal and parietal white matter. Small acute areas of acute infarct in the left medial temporal lobe and in the left basal ganglia. No acute infarct on the right. Generalized atrophy. Negative for hydrocephalus. Mild chronic microvascular ischemic change in the white matter. Chronic infarcts in the cerebellum bilaterally. Negative for hemorrhage or mass. Vascular: Dilated and tortuous basilar artery. Normal arterial flow voids in the circle-of-Willis. Skull and upper cervical spine: Negative Sinuses/Orbits: Mild mucosal edema paranasal sinuses. Bilateral cataract removal. Other: None MR CIRCLE OF WILLIS FINDINGS Dilatation of the distal left vertebral artery and the basilar which are tortuous compatible with hypertension and atherosclerotic disease. No significant basilar stenosis. Both vertebral arteries contribute to the basilar with left vertebral dominant. Superior cerebellar and posterior cerebral arteries patent bilaterally. PICA patent bilaterally. Internal carotid artery patent bilaterally without stenosis. Anterior and middle cerebral  arteries patent without stenosis or large vessel occlusion. Negative for cerebral aneurysm. MRA NECK FINDINGS Antegrade flow in the carotid and vertebral arteries bilaterally Atherosclerotic plaque at the left carotid bifurcation with approximately 60% diameter stenosis left internal carotid artery. Filling defect in the proximal left internal carotid artery compatible with thrombus. This is likely a cause of embolic infarcts on the left. Right carotid bifurcation widely patent without stenosis Dominant left vertebral artery. No significant vertebral stenosis bilaterally. 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 Irregular plaque proximal left internal carotid artery with 60% diameter narrowing. Filling defect in the left carotid bulb compatible with thrombus. This is likely a cause of embolic infarctions in the left MCA territory. Atrophy and chronic microvascular ischemia. No intracranial large vessel occlusion. Electronically Signed: By: Franchot Gallo M.D. On: 06/01/2017 13:28   Mr [name] Head Wo Contrast  Addendum Date: 06/01/2017   ADDENDUM REPORT: 06/01/2017 13:51 ADDENDUM: These results were called by telephone at the time of interpretation on 06/01/2017 at 1:51 pm to Dr. Nira Conn Florena Kozma , who verbally acknowledged these results. Electronically Signed   By: Franchot Gallo M.D.   On: 06/01/2017 13:51   Result Date: 06/01/2017 CLINICAL DATA:  Stroke EXAM: MR HEAD WITHOUT CONTRAST MR CIRCLE OF WILLIS WITHOUT CONTRAST MRA OF THE NECK WITHOUT AND WITH CONTRAST TECHNIQUE: Multiplanar, multiecho pulse sequences of the brain, circle of Willis and surrounding structures were obtained without intravenous contrast. Angiographic images of the neck were obtained using MRA technique without and with intravenous contrast. CONTRAST:  < 15 mL > MULTIHANCE GADOBENATE DIMEGLUMINE 529 MG/ML IV SOLN COMPARISON:  CT head 05/31/2017 FINDINGS: MR HEAD FINDINGS Brain: Multiple small areas of acute infarct  in the left MCA territory. These involve the frontal and parietal white matter. Small acute areas of acute infarct in the left medial temporal lobe and in the left basal ganglia. No acute infarct on the right. Generalized atrophy. Negative for hydrocephalus. Mild chronic microvascular ischemic change in the white matter. Chronic infarcts in the cerebellum bilaterally. Negative for hemorrhage or mass. Vascular: Dilated and tortuous basilar artery. Normal arterial flow voids in the circle-of-Willis. Skull and upper cervical spine: Negative Sinuses/Orbits: Mild mucosal edema paranasal sinuses. Bilateral cataract removal. Other: None MR CIRCLE OF WILLIS FINDINGS Dilatation of the distal left vertebral artery and the basilar which are tortuous compatible with hypertension and atherosclerotic disease. No significant basilar stenosis. Both vertebral arteries contribute to the basilar with left vertebral dominant. Superior cerebellar and  posterior cerebral arteries patent bilaterally. PICA patent bilaterally. Internal carotid artery patent bilaterally without stenosis. Anterior and middle cerebral arteries patent without stenosis or large vessel occlusion. Negative for cerebral aneurysm. MRA NECK FINDINGS Antegrade flow in the carotid and vertebral arteries bilaterally Atherosclerotic plaque at the left carotid bifurcation with approximately 60% diameter stenosis left internal carotid artery. Filling defect in the proximal left internal carotid artery compatible with thrombus. This is likely a cause of embolic infarcts on the left. Right carotid bifurcation widely patent without stenosis Dominant left vertebral artery. No significant vertebral stenosis bilaterally. 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 Irregular plaque proximal left internal carotid artery with 60% diameter narrowing. Filling defect in the left carotid bulb compatible with thrombus. This is likely a cause of embolic  infarctions in the left MCA territory. Atrophy and chronic microvascular ischemia. No intracranial large vessel occlusion. Electronically Signed: By: Franchot Gallo M.D. On: 06/01/2017 13:28    Lab Data:  CBC: Recent Labs  Lab 06/01/17 0452  WBC 40.9*  NEUTROABS 2.0  HGB 10.4*  HCT 32.5*  MCV 104.8*  PLT 659*   Basic Metabolic Panel: Recent Labs  Lab 06/01/17 0452  NA 140  K 4.1  CL 109  CO2 22  GLUCOSE 106*  BUN 20  CREATININE 1.03  CALCIUM 8.4*   GFR: Estimated Creatinine Clearance: 55.5 mL/min (by C-G formula based on SCr of 1.03 mg/dL). Liver Function Tests: Recent Labs  Lab 06/01/17 0452  AST 18  ALT 12*  ALKPHOS 59  BILITOT 0.7  PROT 4.9*  ALBUMIN 3.2*   No results for input(s): LIPASE, AMYLASE in the last 168 hours. Recent Labs  Lab 06/01/17 0452  AMMONIA 31   Coagulation Profile: Recent Labs  Lab 06/01/17 0452  INR 1.19   Cardiac Enzymes: No results for input(s): CKTOTAL, CKMB, CKMBINDEX, TROPONINI in the last 168 hours. BNP (last 3 results) No results for input(s): PROBNP in the last 8760 hours. HbA1C: Recent Labs    06/01/17 0452  HGBA1C 6.0*   CBG: Recent Labs  Lab 06/01/17 2124  GLUCAP 97   Lipid Profile: Recent Labs    06/01/17 0452  CHOL 134  HDL 37*  LDLCALC 81  TRIG 80  CHOLHDL 3.6   Thyroid Function Tests: Recent Labs    06/01/17 0452  TSH 2.095   Anemia Panel: Recent Labs    06/01/17 0452  VITAMINB12 369   Urine analysis:    Component Value Date/Time   COLORURINE STRAW (A) 06/01/2017 0747   APPEARANCEUR CLEAR 06/01/2017 0747   LABSPEC 1.008 06/01/2017 [place] 7.0 06/01/2017 0747   GLUCOSEU NEGATIVE 06/01/2017 0747   HGBUR SMALL (A) 06/01/2017 0747   BILIRUBINUR NEGATIVE 06/01/2017 [place] 06/01/2017 0747   PROTEINUR NEGATIVE 06/01/2017 0747   NITRITE NEGATIVE 06/01/2017 0747   LEUKOCYTESUR NEGATIVE 06/01/2017 0747     Winta Barcelo M.D. [facility] 06/02/2017,  10:25 AM  Pager: [phone] Between 7am to 7pm - call Pager - [phone]  After 7pm go to [email or web address] - password [email or web address]  Call night coverage person covering after 7pm

## 2017-06-02 NOTE — Care Management Obs Status (Signed)
[place] NOTIFICATION   Patient Details  Name: Gregory Rodgers MRN: [id number] Date of Birth: Mar 05, 1928   Medicare Observation Status Notification Given:  Yes    Claudie [name], RN 06/02/2017, 11:33 AM

## 2017-06-03 ENCOUNTER — Inpatient Hospital Stay (HOSPITAL_COMMUNITY): Payer: PPO

## 2017-06-03 ENCOUNTER — Encounter (HOSPITAL_COMMUNITY): Payer: Self-pay | Admitting: *Deleted

## 2017-06-03 ENCOUNTER — Encounter (HOSPITAL_COMMUNITY)
Admission: AD | Disposition: A | Discharge status: Home Health | Payer: Self-pay | Source: Other Acute Inpatient Hospital | Attending: Internal Medicine

## 2017-06-03 DIAGNOSIS — Q211 Atrial septal defect: Secondary | ICD-10-CM

## 2017-06-03 DIAGNOSIS — I6389 Other cerebral infarction: Secondary | ICD-10-CM

## 2017-06-03 HISTORY — PX: TRANSTHORACIC ECHOCARDIOGRAM: SHX275

## 2017-06-03 HISTORY — PX: TEE WITHOUT CARDIOVERSION: SHX5443

## 2017-06-03 LAB — HEPARIN LEVEL (UNFRACTIONATED)
HEPARIN UNFRACTIONATED: 0.7 [IU]/mL (ref 0.30–0.70)
Heparin Unfractionated: 0.58 IU/mL (ref 0.30–0.70)

## 2017-06-03 LAB — FOLATE RBC
FOLATE, HEMOLYSATE: 556 ng/mL
FOLATE, RBC: 1799 ng/mL (ref >498)
HEMATOCRIT: 30.9 % — AB (ref 37.5–51.0)

## 2017-06-03 LAB — BASIC METABOLIC PANEL
ANION GAP: 8 (ref 5–15)
BUN: 18 mg/dL (ref 6–20)
CALCIUM: 8.4 mg/dL — AB (ref 8.9–10.3)
CO2: 23 mmol/L (ref 22–32)
Chloride: 109 mmol/L (ref 101–111)
Creatinine, Ser: 1.02 mg/dL (ref 0.61–1.24)
Glucose, Bld: 104 mg/dL — ABNORMAL HIGH (ref 65–99)
POTASSIUM: 4.5 mmol/L (ref 3.5–5.1)
SODIUM: 140 mmol/L (ref 135–145)

## 2017-06-03 LAB — CBC
HCT: 34.2 % — ABNORMAL LOW (ref 39.0–52.0)
HEMOGLOBIN: 10.7 g/dL — AB (ref 13.0–17.0)
MCH: 32.5 pg (ref 26.0–34.0)
MCHC: 31.3 g/dL (ref 30.0–36.0)
MCV: 104 fL — ABNORMAL HIGH (ref 78.0–100.0)
PLATELETS: 144 10*3/uL — AB (ref 150–400)
RBC: 3.29 MIL/uL — AB (ref 4.22–5.81)
RDW: 13.5 % (ref 11.5–15.5)
WBC: 37.1 10*3/uL — AB (ref 4.0–10.5)

## 2017-06-03 LAB — PATHOLOGIST SMEAR REVIEW

## 2017-06-03 SURGERY — ECHOCARDIOGRAM, TRANSESOPHAGEAL
Anesthesia: Moderate Sedation

## 2017-06-03 MED ORDER — FENTANYL CITRATE (PF) 100 MCG/2ML IJ SOLN
INTRAMUSCULAR | Status: AC
  Filled 2017-06-03: qty 2

## 2017-06-03 MED ORDER — LISINOPRIL 10 MG PO TABS
10.0000 mg | ORAL_TABLET | Freq: Every day | ORAL | Status: DC
  Administered 2017-06-04: 10 mg via ORAL
  Filled 2017-06-03: qty 1

## 2017-06-03 MED ORDER — BUTAMBEN-TETRACAINE-BENZOCAINE 2-2-14 % EX AERO
INHALATION_SPRAY | CUTANEOUS | Status: DC | PRN
  Administered 2017-06-03: 2 via TOPICAL

## 2017-06-03 MED ORDER — MIDAZOLAM HCL 10 MG/2ML IJ SOLN
INTRAMUSCULAR | Status: DC | PRN
  Administered 2017-06-03 (×2): 2 mg via INTRAVENOUS

## 2017-06-03 MED ORDER — MIDAZOLAM HCL 5 MG/ML IJ SOLN
INTRAMUSCULAR | Status: AC
  Filled 2017-06-03: qty 2

## 2017-06-03 MED ORDER — SODIUM CHLORIDE 0.9 % IV SOLN
INTRAVENOUS | Status: DC
  Administered 2017-06-03: 14:00:00 via INTRAVENOUS

## 2017-06-03 MED ORDER — FENTANYL CITRATE (PF) 100 MCG/2ML IJ SOLN
INTRAMUSCULAR | Status: DC | PRN
  Administered 2017-06-03: 25 ug via INTRAVENOUS

## 2017-06-03 NOTE — CV Procedure (Addendum)
Procedure: TEE  Sedation: Versed 3 mg IV, Fentanyl 25 mcg IV  Indication: CVA  Findings: Please see echo section for full report.  Normal LV size with moderate focal basal septal hypertrophy.  EF 45-50%, diffuse hypokinesis. Normal RV size and systolic function.  Pacemaker wire in RV.  Mild left atrial enlargement, no LA appendage thrombus.  Nomal right atrium.  There was a large PFO, bubble study markedly positive.  Trivial tricuspid regurgitation.  Trivial mitral regurgitation.  Trileaflet aortic valve with no stenosis, moderate regurgitation.  Normal caliber but tortuous thoracic aorta with grade III plaque in the descending thoracic aorta.   Source of embolus: PFO.   Gregory Rodgers 06/03/2017 3:15 PM

## 2017-06-03 NOTE — Progress Notes (Signed)
  Echocardiogram Echocardiogram Transesophageal has been performed.  Bobbye Charleston 06/03/2017, 3:36 PM

## 2017-06-03 NOTE — Progress Notes (Signed)
    [facility] has been requested to perform a transesophageal echocardiogram on Gregory Rodgers for stroke.  After careful review of history and examination, the risks and benefits of transesophageal echocardiogram have been explained including risks of esophageal damage, perforation (1:10,000 risk), bleeding, pharyngeal hematoma as well as other potential complications associated with conscious sedation including aspiration, arrhythmia, respiratory failure and death. Alternatives to treatment were discussed, questions were answered. Patient is willing to proceed.  TEE - Dr. Aundra Dubin @  1500. Keep NPO.  Meds with sips.   Leanor Kail, PA-C 06/03/2017 11:15 AM

## 2017-06-03 NOTE — Progress Notes (Signed)
OT Cancellation Note  Patient Details Name: Gregory Rodgers MRN: [id number] DOB: Nov 14, 1927   Cancelled Treatment:    Reason Eval/Treat Not Completed: Patient at procedure or test/ unavailable; will follow up as schedule allows and as Pt is available.   Lou Cal, OT Pager [phone] 06/03/2017   Gregory Rodgers 06/03/2017, 3:22 PM

## 2017-06-03 NOTE — H&P (View-Only) (Signed)
[facility]                                                                              Patient Demographics  Gregory Rodgers, is a 84 y.o. male, DOB - January 27, 1928, WER:[id number]  Admit date - 06/01/2017   Admitting Physician Vianne Bulls, MD  Outpatient Primary MD for the patient is Melony Overly, MD  Outpatient specialists:   LOS - 1  days   Medical records reviewed and are as summarized below:    No chief complaint on file.      Brief summary   Patient is a 84 year old male with hypertension, CAD, chronic combined systolic and diastolic CHF, with ICD pacemaker presented to ED with intermittent slurring of speech, transient right eye blindness. Patient reported occasional problems with finding words for more than a year but has been more frequent over the past week and acutely worse a day before the admission. Also experienced transient blindness in the right eye initially a couple of years ago then again a day before the admission. CT head negative. Patient was admitted for further workup.   Assessment & Plan    Principal Problem:   Transient speech disturbance - Presented with transient slurred speech and difficulty word finding, visual disturbance in the right eye - CT head negative for acute stroke - MRI/MRA of the brain showed multiple small acute infarcts in the left MCA territory, irregular plaque proximal left ICA with 60% diameter narrowing. Filling defect in the left carotid bulb compatible with thrombus, likely cause of embolic infarctions in the left. No intracranial large vessel occlusion Discussed with neurology and patient was started on IV heparin drip - does not need carotid Dopplers with MRA neck - 2-D echo showed EF of 40-45%, normal wall motion, grade 1 diastolic dysfunction. Compared to prior echo in 19/50, systolic function has improved, ascending aortic aneurysm is unchanged.  - LDL 81, started on Lipitor 40 mg daily - Continue  aspirin per neuro recommendations -TEE planned today, per cards PA, Bhavinkumar Bhagat, the device was interrogated and no atrial fibrillation noted. -  Dr.  Estanislado Pandy consulted, plan for cerebral angiogram today  Addendum: 2:50PM D/w Dr Leonie Man, doesn't does not need cerebral angiogram or loop recorder, he canceled the 2 procedures. Recommended to continue with the TEE.   Active Problems:   CLL (chronic lymphocytic leukemia) (HCC) - WBC count improving, close to baseline - No infectious signs or symptoms.  UA negative for UTI. Chest x-ray showed no superimposed acute pathology per - Blood cultures negative so far     Essential hypertension - Currently stable, -Restart ACE inhibitor, beta-blocker still on hold    CAD S/P percutaneous coronary angioplasty,  Ischemic cardiomyopathy: EF 40-45%  now 30% - Continue aspirin, statin - 2-D echo showed EF of 40-45%, normal wall motion, grade 1 diastolic dysfunction. Compared to prior echo in 93/26, systolic function has improved, ascending aortic aneurysm is unchanged.    CODE STATUS: Full  DVT Prophylaxis:  Lovenox  Family Communication: Discussed in detail with the patient, all imaging results, lab results explained to the patient, wife and 2 daughters  at  the bedside    Disposition Plan: Once the workup is complete  Time Spent in minutes: 25 minute   Procedures:  CT head MRI/MRA brain   2-D echo  Consultants:   Neuro Cardiology Interventional radiology  Antimicrobials:      Medications  Scheduled Meds: . aspirin  300 mg Rectal Daily   Or  . aspirin  325 mg Oral Daily  . atorvastatin  40 mg Oral q1800  . loratadine  10 mg Oral Daily  . multivitamin with minerals  1 tablet Oral Daily  . omega-3 acid ethyl esters  1 g Oral Daily  . sodium chloride  1 spray Each Nare BID   Continuous Infusions: . heparin 1,050 Units/hr (06/03/17 1139)   PRN Meds:.acetaminophen **OR** acetaminophen (TYLENOL) oral liquid 160 mg/5 mL  **OR** acetaminophen, alum & mag hydroxide-simeth, senna-docusate   Antibiotics   Anti-infectives (From admission, onward)   None        Subjective:   Gregory Rodgers was seen and examined today. No complaints today, back to baseline, confirmed with family members at the bedside.  Speech improved, no facial drooping.  No new FND's.   Patient denies dizziness, chest pain, shortness of breath, abdominal pain, N/V/D/C. No acute events overnight.    Objective:   Vitals:   06/03/17 0131 06/03/17 0511 06/03/17 0800 06/03/17 1200  BP: (!) 158/69 (!) 146/69 (!) 147/67 (!) 172/63  Pulse: 66 (!) 58 61 70  Resp: 18 18 18 18   Temp: 98.4 F (36.9 C) 97.6 F (36.4 C) 98.4 F (36.9 C) 97.7 F (36.5 C)  TempSrc: Oral Oral Oral Oral  SpO2: 96% 98% 99% 100%  Weight:      Height:        Intake/Output Summary (Last 24 hours) at 06/03/2017 1322 Last data filed at 06/03/2017 1200 Gross per 24 hour  Intake 120 ml  Output -  Net 120 ml     Wt Readings from Last 3 Encounters:  06/01/17 80.6 kg (177 lb 11.1 oz)  04/10/17 80.2 kg (176 lb 12.8 oz)  01/10/17 77.1 kg (169 lb 15.6 oz)     Exam   General: Alert and oriented x 3, NAD, speech much clear today  Eyes:  HEENT:    Cardiovascular: S1 S2 auscultated,  Regular rate and rhythm. No pedal edema b/l  Respiratory: Clear to auscultation bilaterally, no wheezing, rales or rhonchi  Gastrointestinal: Soft, nontender, nondistended, + bowel sounds  Ext: no pedal edema bilaterally  Neuro: AAOx3, Cr N's II- XII. Strength 5/5 upper and lower extremities bilaterally, speech clear  Musculoskeletal: No digital cyanosis, clubbing  Skin: No rashes  Psych: Normal affect and demeanor, alert and oriented x3    Data Reviewed:  I have personally reviewed following labs and imaging studies  Micro Results Recent Results (from the past 240 hour(s))  MRSA PCR Screening     Status: None   Collection Time: 06/01/17  6:18 AM  Result Value  Ref Range Status   MRSA by PCR NEGATIVE NEGATIVE Final    Comment:        The GeneXpert MRSA Assay (FDA approved for NASAL specimens only), is one component of a comprehensive MRSA colonization surveillance program. It is not intended to diagnose MRSA infection nor to guide or monitor treatment for MRSA infections. Performed at [facility], [place] [address] [place], [place] [address]   Culture, blood (routine x 2)     Status: None (Preliminary result)   Collection Time: 06/01/17  8:58 AM  Result Value Ref Range Status   Specimen Description BLOOD RIGHT ARM  Final   Special Requests   Final    BOTTLES DRAWN AEROBIC AND ANAEROBIC Blood Culture adequate volume   Culture   Final    NO GROWTH 1 DAY Performed at [facility], [address], [place], [place] [address]    Report Status PENDING  Incomplete  Culture, blood (routine x 2)     Status: None (Preliminary result)   Collection Time: 06/01/17  9:07 AM  Result Value Ref Range Status   Specimen Description BLOOD RIGHT ARM  Final   Special Requests   Final    BOTTLES DRAWN AEROBIC AND ANAEROBIC Blood Culture adequate volume   Culture   Final    NO GROWTH 1 DAY Performed at [facility], [place] [address] [place], [place] [address]    Report Status PENDING  Incomplete    Radiology Reports Dg Chest 2 View  Result Date: 06/01/2017 CLINICAL DATA:  Elevated white count EXAM: CHEST  2 VIEW COMPARISON:  05/31/2017 FINDINGS: Left subclavian pacemaker/AICD noted with 3 leads as before. Lower lung volumes accentuating the vascularity centrally. Mild cardiac enlargement. No current CHF or edema. No significant focal airspace process, collapse or consolidation. Negative for effusion or pneumothorax. Aorta is atherosclerotic and tortuous. Degenerative changes noted spine. IMPRESSION: Lower lung volumes otherwise stable exam. No superimposed acute chest process. Electronically Signed   By: Jerilynn Mages.  Shick M.D.   On: 06/01/2017 10:48    Mr [name] Neck W Wo Contrast  Addendum Date: 06/01/2017   ADDENDUM REPORT: 06/01/2017 13:51 ADDENDUM: These results were called by telephone at the time of interpretation on 06/01/2017 at 1:51 pm to Dr. Nira Conn Evrett Hakim , who verbally acknowledged these results. Electronically Signed   By: Franchot Gallo M.D.   On: 06/01/2017 13:51   Result Date: 06/01/2017 CLINICAL DATA:  Stroke EXAM: MR HEAD WITHOUT CONTRAST MR CIRCLE OF WILLIS WITHOUT CONTRAST MRA OF THE NECK WITHOUT AND WITH CONTRAST TECHNIQUE: Multiplanar, multiecho pulse sequences of the brain, circle of Willis and surrounding structures were obtained without intravenous contrast. Angiographic images of the neck were obtained using MRA technique without and with intravenous contrast. CONTRAST:  < 15 mL > MULTIHANCE GADOBENATE DIMEGLUMINE 529 MG/ML IV SOLN COMPARISON:  CT head 05/31/2017 FINDINGS: MR HEAD FINDINGS Brain: Multiple small areas of acute infarct in the left MCA territory. These involve the frontal and parietal white matter. Small acute areas of acute infarct in the left medial temporal lobe and in the left basal ganglia. No acute infarct on the right. Generalized atrophy. Negative for hydrocephalus. Mild chronic microvascular ischemic change in the white matter. Chronic infarcts in the cerebellum bilaterally. Negative for hemorrhage or mass. Vascular: Dilated and tortuous basilar artery. Normal arterial flow voids in the circle-of-Willis. Skull and upper cervical spine: Negative Sinuses/Orbits: Mild mucosal edema paranasal sinuses. Bilateral cataract removal. Other: None MR CIRCLE OF WILLIS FINDINGS Dilatation of the distal left vertebral artery and the basilar which are tortuous compatible with hypertension and atherosclerotic disease. No significant basilar stenosis. Both vertebral arteries contribute to the basilar with left vertebral dominant. Superior cerebellar and posterior cerebral arteries patent bilaterally. PICA patent bilaterally.  Internal carotid artery patent bilaterally without stenosis. Anterior and middle cerebral arteries patent without stenosis or large vessel occlusion. Negative for cerebral aneurysm. MRA NECK FINDINGS Antegrade flow in the carotid and vertebral arteries bilaterally Atherosclerotic plaque at the left carotid bifurcation with approximately 60% diameter  stenosis left internal carotid artery. Filling defect in the proximal left internal carotid artery compatible with thrombus. This is likely a cause of embolic infarcts on the left. Right carotid bifurcation widely patent without stenosis Dominant left vertebral artery. No significant vertebral stenosis bilaterally. 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 Irregular plaque proximal left internal carotid artery with 60% diameter narrowing. Filling defect in the left carotid bulb compatible with thrombus. This is likely a cause of embolic infarctions in the left MCA territory. Atrophy and chronic microvascular ischemia. No intracranial large vessel occlusion. Electronically Signed: By: Franchot Gallo M.D. On: 06/01/2017 13:28   Mr Brain Wo Contrast  Addendum Date: 06/01/2017   ADDENDUM REPORT: 06/01/2017 13:51 ADDENDUM: These results were called by telephone at the time of interpretation on 06/01/2017 at 1:51 pm to Dr. Nira Conn Hassan [place] , who verbally acknowledged these results. Electronically Signed   By: Franchot Gallo M.D.   On: 06/01/2017 13:51   Result Date: 06/01/2017 CLINICAL DATA:  Stroke EXAM: MR HEAD WITHOUT CONTRAST MR CIRCLE OF WILLIS WITHOUT CONTRAST MRA OF THE NECK WITHOUT AND WITH CONTRAST TECHNIQUE: Multiplanar, multiecho pulse sequences of the brain, circle of Willis and surrounding structures were obtained without intravenous contrast. Angiographic images of the neck were obtained using MRA technique without and with intravenous contrast. CONTRAST:  < 15 mL > MULTIHANCE GADOBENATE DIMEGLUMINE 529 MG/ML IV SOLN COMPARISON:  CT  head 05/31/2017 FINDINGS: MR HEAD FINDINGS Brain: Multiple small areas of acute infarct in the left MCA territory. These involve the frontal and parietal white matter. Small acute areas of acute infarct in the left medial temporal lobe and in the left basal ganglia. No acute infarct on the right. Generalized atrophy. Negative for hydrocephalus. Mild chronic microvascular ischemic change in the white matter. Chronic infarcts in the cerebellum bilaterally. Negative for hemorrhage or mass. Vascular: Dilated and tortuous basilar artery. Normal arterial flow voids in the circle-of-Willis. Skull and upper cervical spine: Negative Sinuses/Orbits: Mild mucosal edema paranasal sinuses. Bilateral cataract removal. Other: None MR CIRCLE OF WILLIS FINDINGS Dilatation of the distal left vertebral artery and the basilar which are tortuous compatible with hypertension and atherosclerotic disease. No significant basilar stenosis. Both vertebral arteries contribute to the basilar with left vertebral dominant. Superior cerebellar and posterior cerebral arteries patent bilaterally. PICA patent bilaterally. Internal carotid artery patent bilaterally without stenosis. Anterior and middle cerebral arteries patent without stenosis or large vessel occlusion. Negative for cerebral aneurysm. MRA NECK FINDINGS Antegrade flow in the carotid and vertebral arteries bilaterally Atherosclerotic plaque at the left carotid bifurcation with approximately 60% diameter stenosis left internal carotid artery. Filling defect in the proximal left internal carotid artery compatible with thrombus. This is likely a cause of embolic infarcts on the left. Right carotid bifurcation widely patent without stenosis Dominant left vertebral artery. No significant vertebral stenosis bilaterally. 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 Irregular plaque proximal left internal carotid artery with 60% diameter narrowing. Filling defect in the  left carotid bulb compatible with thrombus. This is likely a cause of embolic infarctions in the left MCA territory. Atrophy and chronic microvascular ischemia. No intracranial large vessel occlusion. Electronically Signed: By: Franchot Gallo M.D. On: 06/01/2017 13:28   Mr [name] Head Wo Contrast  Addendum Date: 06/01/2017   ADDENDUM REPORT: 06/01/2017 13:51 ADDENDUM: These results were called by telephone at the time of interpretation on 06/01/2017 at 1:51 pm to Dr. Nira Conn Shebra Muldrow , who verbally acknowledged these results. Electronically Signed  By: Franchot Gallo M.D.   On: 06/01/2017 13:51   Result Date: 06/01/2017 CLINICAL DATA:  Stroke EXAM: MR HEAD WITHOUT CONTRAST MR CIRCLE OF WILLIS WITHOUT CONTRAST MRA OF THE NECK WITHOUT AND WITH CONTRAST TECHNIQUE: Multiplanar, multiecho pulse sequences of the brain, circle of Willis and surrounding structures were obtained without intravenous contrast. Angiographic images of the neck were obtained using MRA technique without and with intravenous contrast. CONTRAST:  < 15 mL > MULTIHANCE GADOBENATE DIMEGLUMINE 529 MG/ML IV SOLN COMPARISON:  CT head 05/31/2017 FINDINGS: MR HEAD FINDINGS Brain: Multiple small areas of acute infarct in the left MCA territory. These involve the frontal and parietal white matter. Small acute areas of acute infarct in the left medial temporal lobe and in the left basal ganglia. No acute infarct on the right. Generalized atrophy. Negative for hydrocephalus. Mild chronic microvascular ischemic change in the white matter. Chronic infarcts in the cerebellum bilaterally. Negative for hemorrhage or mass. Vascular: Dilated and tortuous basilar artery. Normal arterial flow voids in the circle-of-Willis. Skull and upper cervical spine: Negative Sinuses/Orbits: Mild mucosal edema paranasal sinuses. Bilateral cataract removal. Other: None MR CIRCLE OF WILLIS FINDINGS Dilatation of the distal left vertebral artery and the basilar which are tortuous  compatible with hypertension and atherosclerotic disease. No significant basilar stenosis. Both vertebral arteries contribute to the basilar with left vertebral dominant. Superior cerebellar and posterior cerebral arteries patent bilaterally. PICA patent bilaterally. Internal carotid artery patent bilaterally without stenosis. Anterior and middle cerebral arteries patent without stenosis or large vessel occlusion. Negative for cerebral aneurysm. MRA NECK FINDINGS Antegrade flow in the carotid and vertebral arteries bilaterally Atherosclerotic plaque at the left carotid bifurcation with approximately 60% diameter stenosis left internal carotid artery. Filling defect in the proximal left internal carotid artery compatible with thrombus. This is likely a cause of embolic infarcts on the left. Right carotid bifurcation widely patent without stenosis Dominant left vertebral artery. No significant vertebral stenosis bilaterally. 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 Irregular plaque proximal left internal carotid artery with 60% diameter narrowing. Filling defect in the left carotid bulb compatible with thrombus. This is likely a cause of embolic infarctions in the left MCA territory. Atrophy and chronic microvascular ischemia. No intracranial large vessel occlusion. Electronically Signed: By: Franchot Gallo M.D. On: 06/01/2017 13:28    Lab Data:  CBC: Recent Labs  Lab 06/01/17 0452 06/02/17 0954 06/03/17 0232  WBC 40.9* 38.9* 37.1*  NEUTROABS 2.0  --   --   HGB 10.4* 11.5* 10.7*  HCT 32.5* 36.0* 34.2*  MCV 104.8* 105.6* 104.0*  PLT 142* 145* 607*   Basic Metabolic Panel: Recent Labs  Lab 06/01/17 0452 06/02/17 0954 06/03/17 0232  NA 140 142 140  K 4.1 4.1 4.5  CL 109 111 109  CO2 22 21* 23  GLUCOSE 106* 159* 104*  BUN 20 16 18   CREATININE 1.03 1.06 1.02  CALCIUM 8.4* 8.7* 8.4*   GFR: Estimated Creatinine Clearance: 56 mL/min (by C-G formula based on SCr of  1.02 mg/dL). Liver Function Tests: Recent Labs  Lab 06/01/17 0452  AST 18  ALT 12*  ALKPHOS 59  BILITOT 0.7  PROT 4.9*  ALBUMIN 3.2*   No results for input(s): LIPASE, AMYLASE in the last 168 hours. Recent Labs  Lab 06/01/17 0452  AMMONIA 31   Coagulation Profile: Recent Labs  Lab 06/01/17 0452  INR 1.19   Cardiac Enzymes: No results for input(s): CKTOTAL, CKMB, CKMBINDEX, TROPONINI in the last 168  hours. BNP (last 3 results) No results for input(s): PROBNP in the last 8760 hours. HbA1C: Recent Labs    06/01/17 0452  HGBA1C 6.0*   CBG: Recent Labs  Lab 06/01/17 2124  GLUCAP 97   Lipid Profile: Recent Labs    06/01/17 0452  CHOL 134  HDL 37*  LDLCALC 81  TRIG 80  CHOLHDL 3.6   Thyroid Function Tests: Recent Labs    06/01/17 0452  TSH 2.095   Anemia Panel: Recent Labs    06/01/17 0452  VITAMINB12 369   Urine analysis:    Component Value Date/Time   COLORURINE STRAW (A) 06/01/2017 0747   APPEARANCEUR CLEAR 06/01/2017 0747   LABSPEC 1.008 06/01/2017 [place] 7.0 06/01/2017 0747   GLUCOSEU NEGATIVE 06/01/2017 0747   HGBUR SMALL (A) 06/01/2017 0747   BILIRUBINUR NEGATIVE 06/01/2017 [place] 06/01/2017 0747   PROTEINUR NEGATIVE 06/01/2017 0747   NITRITE NEGATIVE 06/01/2017 0747   LEUKOCYTESUR NEGATIVE 06/01/2017 0747     Beanca Kiester M.D. [facility] 06/03/2017, 1:22 PM  Pager: [phone] Between 7am to 7pm - call Pager - [phone]  After 7pm go to [email or web address] - password [email or web address]  Call night coverage person covering after 7pm

## 2017-06-03 NOTE — Progress Notes (Signed)
STROKE TEAM PROGRESS NOTE   HISTORY OF PRESENT ILLNESS (per record) Gregory Rodgers is an 84 y.o. 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 He also had transient left eye vision loss the day prior to arrival.  Patient is accompanied by his wife and daughters who assist with the history. They report that he has had occasional problems "finding words" for more than a year, but has been more frequent over the past week. Wife states that even before yesterday while at church patient had sudden onset left eye vision loss which he complained only after the got in the car. The patient is a poor historian but states that he remembers losing vision in the left eye is slightly painful. He denies headache, jaw claudication, joint pains. He has similar episode of vision loss in his left eye that lasted for a few days over a year ago. Today patient was wife are getting out of the car he seemed confused. He tried to talk with the words makes no sense. She got him into the house and noticed that he was dragging his right leg more than usual. Otherwise no facial droop was seen, it did not appear his right any extremity was weaker than normal. She called her daughter will try to talk to him on the phone but he was confused and not making sense and it was decided to call EMS.  He had a pacemaker placed in September 2018. St[employer] Jude (serial # [id number] ) right atrial lead and a [employer]. Jude (serial number [id number]) right ventricular defibrillator lead St. Jude(serial number [id number] [employer]. Jude (serial Number [id number]) biventricular ICD placed after he had symptomatic NSVT.    Date last known well: 2.14.19 tPA Given: no, outside window NIHSS: 1 Baseline MRS 0   SUBJECTIVE (INTERVAL HISTORY) His wife and daughters are at the  bedside.he is having interrogation of the defibrillator in the room and A. fib or atrial flutter is not found. He will be scheduled for TEE later today I believe there is no need for diagnostic catheter angiogram as his CT angiogram shows a nonocclusive thrombus in the left proximal carotid which will not be treated with acute revascularization. Discussed with Dr. Estanislado Pandy who was in agreement.    OBJECTIVE Temp:  [97.6 F (36.4 C)-98.4 F (36.9 C)] 98 F (36.7 C) (02/18 1337) Pulse Rate:  [55-70] 55 (02/18 1440) Cardiac Rhythm: Ventricular paced;Heart block (02/18 0700) Resp:  [16-18] 18 (02/18 1440) BP: (134-201)/(52-91) 201/62 (02/18 1440) SpO2:  [95 %-100 %] 96 % (02/18 1440) Weight:  [177 lb (80.3 kg)] 177 lb (80.3 kg) (02/18 1337)  CBC:  Recent Labs  Lab 06/01/17 0452 06/02/17 0954 06/03/17 0232  WBC 40.9* 38.9* 37.1*  NEUTROABS 2.0  --   --   HGB 10.4* 11.5* 10.7*  HCT 32.5*  30.9* 36.0* 34.2*  MCV 104.8* 105.6* 104.0*  PLT 142* 145* 144*    Basic Metabolic Panel:  Recent Labs  Lab 06/02/17 0954 06/03/17 0232  NA 142 140  K 4.1 4.5  CL 111 109  CO2 21* 23  GLUCOSE 159* 104*  BUN 16 18  CREATININE 1.06 1.02  CALCIUM 8.7* 8.4*    Lipid Panel:     Component Value Date/Time   CHOL 134 06/01/2017 0452   TRIG 80 06/01/2017 0452   HDL 37 (L) 06/01/2017 2353  CHOLHDL 3.6 06/01/2017 0452   VLDL 16 06/01/2017 0452   LDLCALC 81 06/01/2017 0452   HgbA1c:  Lab Results  Component Value Date   HGBA1C 6.0 (H) 06/01/2017   Urine Drug Screen: No results found for: LABOPIA, COCAINSCRNUR, LABBENZ, AMPHETMU, THCU, LABBARB  Alcohol Level No results found for: [facility]  IMAGING   Dg Chest 2 View 06/01/2017 IMPRESSION:  Lower lung volumes otherwise stable exam. No superimposed acute chest process.    Mr [name] Neck W Wo Contrast Mr [name] Wo Contrast 06/01/2017   IMPRESSION:  Multiple small acute infarcts in the left MCA territory.  Irregular plaque proximal  left internal carotid artery with 60% diameter narrowing.  Filling defect in the left carotid bulb compatible with thrombus.  This is likely a cause of embolic infarctions in the left MCA territory.  Atrophy and chronic microvascular ischemia.  No intracranial large vessel occlusion.     Transthoracic Echocardiogram  06/01/2017 Study Conclusions - Left ventricle: The cavity size was normal. There was severe   focal basal hypertrophy of the septum with otherwise mild   concentric hypertrophy. Systolic function was mildly to   moderately reduced. The estimated ejection fraction was in the   range of 40% to 45%. Wall motion was normal; there were no   regional wall motion abnormalities. Doppler parameters are   consistent with abnormal left ventricular relaxation (grade 1   diastolic dysfunction). - Aortic valve: Transvalvular velocity was within the normal range.   There was no stenosis. There was moderate regurgitation.   Regurgitation pressure half-time: 348 ms. - Aorta: Ascending aortic diameter: 42 mm (S). - Ascending aorta: The ascending aorta was mildly dilated. - Mitral valve: Transvalvular velocity was within the normal range.   There was no evidence for stenosis. There was mild regurgitation. - Left atrium: The atrium was mildly dilated. - Right ventricle: The cavity size was normal. Wall thickness was   normal. Systolic function was normal. - Tricuspid valve: There was trivial regurgitation. Impressions: - Compared with the prior echo 24/0973, systolic function has   improved. The ascending aortic aneurysm is unchanged.    PHYSICAL EXAM Vitals:   06/03/17 0800 06/03/17 1200 06/03/17 1337 06/03/17 1440  BP: (!) 147/67 (!) 172/63 (!) 169/91 (!) 201/62  Pulse: 61 70  (!) 55  Resp: 18 18 18 18   Temp: 98.4 F (36.9 C) 97.7 F (36.5 C) 98 F (36.7 C)   TempSrc: Oral Oral Oral   SpO2: 99% 100% 95% 96%  Weight:   177 lb (80.3 kg)   Height:   6' (1.829 m)    PHYSICAL  EXAM Physical exam: Exam: Gen: NAD Eyes: anicteric sclerae, moist conjunctivae                    CV: no MRG, no carotid bruits, no peripheral edema Mental Status: Alert, follows commands  Neuro: Detailed Neurologic Exam  Speech:    No aphasia, no dysarthria  Cranial Nerves:    The pupils are equal, round, and reactive to light.. Attempted, Fundi not visualized.  EOMI. No gaze preference. Visual fields full. Face symmetric, Tongue midline. Hearing intact to voice. Shoulder shrug intact  Motor Observation:    no involuntary movements noted. Tone appears normal.     Strength:    5/5     Sensation:  Intact to LT  Plantars equiv .      ASSESSMENT/PLAN Gregory Rodgers is a 84 y.o. male with history of coronary  artery disease with previous MI, left bundle branch block, borderline diabetes, hyperlipidemia, history of renal calculi, hypertension, chronic lymphocytic leukemia, CHF, cardiomyopathy with symptomatic nonsustained V. tach, and status post placement of a biventricular pacemaker/AICD presenting with transient speech problems, confusion, transient visual loss, and right lower extremity weakness. He did not receive IV t-PA due to late presentation.  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  Resultant  Resolution of symptoms  CT head - not performed  MRI head - Multiple small acute infarcts in the left MCA territory.  MRA head - No intracranial large vessel occlusion.  MRA neck - non-occlusive Filling defect in the left carotid bulb compatible with thrombus.  Carotid Doppler - MRA neck  2D Echo - EF 40-45%. No cardiac source of emboli identified.  LDL - 81  HgbA1c - 6.0  VTE prophylaxis - intravenous heparin Fall precautions Diet NPO time specified  aspirin 81 mg daily prior to admission, now on aspirin 325 mg daily and Heparin drip due to thrombus  Patient counseled to be compliant with his  antithrombotic medications  Ongoing aggressive stroke risk factor management  Therapy recommendations:  pending  Disposition:  Pending  Hypertension  Stable  Permissive hypertension (OK if < 220/120) but gradually normalize in 5-7 days  Long-term BP goal normotensive  Hyperlipidemia  Home meds: No lipid lowering medications prior to admission  LDL 81, goal < 70  Now on Lipitor 40 mg daily  Continue statin at discharge  Diabetes (borderline)  HgbA1c 6.0, goal < 7.0  Controlled  Other Stroke Risk Factors  Advanced age  Possible TIA history  Coronary artery disease   Other Active Problems  Cardiomyopathy / congestive heart failure / status post permanent pacemaker AICD  Blood cultures and urine culture pending  Anemia - chronic lymphocytic leukemia - WBCs - 40.9 - afebrile   Plan / Recommendations   Stroke workup Lt ICA thrombus -> IV heparin per pharmacy ->  TEE today and if no thrombus found we will switch IV heparin to aspirin and Plavix at discharge tomorrow. Discussed with Dr. Tana Coast and Dr. Estanislado Pandy and patient's family Hospital day # 1  Personally examined patient and images, and have participated in and made any corrections needed to history, physical, neuro exam,assessment and plan as stated above.  I have personally obtained the history, evaluated lab date, reviewed imaging studies and agree with radiology interpretations. Greater than 50% time during this 35 minute visit was spent on counseling and coordination of care about his embolic stroke, left carotid thrombus and answering questions   Antony Contras, MD Stroke Neurology  To contact Stroke Continuity provider, please refer to [email or web address] After hours, contact General Neurology

## 2017-06-03 NOTE — Interval H&P Note (Signed)
History and Physical Interval Note:  06/03/2017 3:01 PM  Gregory Rodgers  has presented today for surgery, with the diagnosis of stroke  The various methods of treatment have been discussed with the patient and family. After consideration of risks, benefits and other options for treatment, the patient has consented to  Procedure(s): TRANSESOPHAGEAL ECHOCARDIOGRAM (TEE) (N/A) as a surgical intervention .  The patient's history has been reviewed, patient examined, no change in status, stable for surgery.  I have reviewed the patient's chart and labs.  Questions were answered to the patient's satisfaction.     Amiley Shishido [employer]

## 2017-06-03 NOTE — Progress Notes (Signed)
Patient ID: Gregory Rodgers, male   DOB: [birth year], 84 y.o.   MRN: [id number]  Risks and benefits of cerebral arteriogram were discussed with the patient including, but not limited to bleeding, infection, vascular injury, contrast induced renal failure, stroke or even death.  This interventional procedure involves the use of X-rays and because of the nature of the planned procedure, it is possible that we will have prolonged use of X-ray fluoroscopy.  Potential radiation risks to you include (but are not limited to) the following: - A slightly elevated risk for cancer  several years later in life. This risk is typically less than 0.5% percent. This risk is low in comparison to the normal incidence of human cancer, which is 33% for women and 50% for men according to the [employer]. - Radiation induced injury can include skin redness, resembling a rash, tissue breakdown / ulcers and hair loss (which can be temporary or permanent).   The likelihood of either of these occurring depends on the difficulty of the procedure and whether you are sensitive to radiation due to previous procedures, disease, or genetic conditions.   IF your procedure requires a prolonged use of radiation, you will be notified and given written instructions for further action.  It is your responsibility to monitor the irradiated area for the 2 weeks following the procedure and to notify your physician if you are concerned that you have suffered a radiation induced injury.    All of the patient's questions were answered, patient is agreeable to proceed.  Consent signed and in chart.

## 2017-06-03 NOTE — Progress Notes (Signed)
[place] for heparin Indication:   Carotid bulb artery thrombosis   Allergies  Allergen Reactions  . Tape Other (See Comments)    "PLASTIC" TAPE PULLS OFF THE SKIN AND THE REMOVAL OF ALL TAPES BRUISE HIM!!!    Patient Measurements: Height: 6' (182.9 cm) Weight: 177 lb 11.1 oz (80.6 kg) IBW/kg (Calculated) : 77.6 Heparin Dosing Weight: 80.6kg  Vital Signs: Temp: 97.6 F (36.4 C) (02/18 0511) Temp Source: Oral (02/18 0511) BP: 146/69 (02/18 0511) Pulse Rate: 58 (02/18 0511)  Labs: Recent Labs    06/01/17 0452 06/01/17 2246 06/02/17 0954 06/03/17 0232  HGB 10.4*  --  11.5* 10.7*  HCT 32.5*  --  36.0* 34.2*  PLT 142*  --  145* 144*  LABPROT 15.0  --   --   --   INR 1.19  --   --   --   HEPARINUNFRC  --  0.19* 0.55 0.58  CREATININE 1.03  --  1.06 1.02    Estimated Creatinine Clearance: 56 mL/min (by C-G formula based on SCr of 1.02 mg/dL).   Assessment: 36 YOM found to have L internal carotid artery bulb filling defect consistent with thrombosis- likely cause of embolic infarcts- Dr. Estanislado Pandy contacted and may do angiogram on Monday.  Heparin level low, rate was increased and this morning's heparin level resulted at 0.58, which is just above goal. Hgb and plts stable, no bleeding noted.   Goal of Therapy:  Heparin level 0.3-0.5 units/ml Monitor platelets by anticoagulation protocol: Yes   Plan:  Decrease heparin slightly to 1050 units/hr 8h heparin level, daily heparin level and CBC Follow for s/s bleeding Follow interventional plans  Elicia Lamp, PharmD, BCPS Clinical Pharmacist Clinical phone for 06/03/2017 until 3:30pm: [phone] If after 3:30pm, please call main pharmacy at: [phone] 06/03/2017 8:22 AM

## 2017-06-03 NOTE — Progress Notes (Signed)
PT Cancellation Note  Patient Details Name: Gregory Rodgers MRN: [id number] DOB: Jul 30, 1927   Cancelled Treatment:    Reason Eval/Treat Not Completed: (P) Patient at procedure or test/unavailable Pt is currently undergoing TEE procedure. PT will follow back this afternoon as able.   Gloria Ricardo B. Migdalia Dk PT, DPT Acute Rehabilitation  [phone] Pager [phone]     [place] 06/03/2017, 12:02 PM

## 2017-06-03 NOTE — Progress Notes (Signed)
SLP Cancellation Note  Patient Details Name: Gregory Rodgers MRN: [id number] DOB: [birth year]   Cancelled treatment:       Reason Eval/Treat Not Completed: Patient at procedure or test/unavailable. Will f/u for cognitive linguisitic assessment after procedure complete.    Arbutus Nelligan, Katherene Ponto 06/03/2017, 8:47 AM

## 2017-06-03 NOTE — Plan of Care (Signed)
  Progressing Education: Knowledge of disease or condition will improve 06/03/2017 2327 - Progressing by Mikey College, RN Health Behavior/Discharge Planning: Ability to manage health-related needs will improve 06/03/2017 2327 - Progressing by Mikey College, RN Ischemic Stroke/TIA Tissue Perfusion: Complications of ischemic stroke/TIA will be minimized 06/03/2017 2327 - Progressing by Mikey College, RN

## 2017-06-03 NOTE — Progress Notes (Signed)
[place] for heparin Indication:   Carotid bulb artery thrombosis   Allergies  Allergen Reactions  . Tape Other (See Comments)    "PLASTIC" TAPE PULLS OFF THE SKIN AND THE REMOVAL OF ALL TAPES BRUISE HIM!!!    Patient Measurements: Height: 6' (182.9 cm) Weight: 177 lb (80.3 kg) IBW/kg (Calculated) : 77.6 Heparin Dosing Weight: 80.6kg  Vital Signs: Temp: 97.4 F (36.3 C) (02/18 1621) Temp Source: Oral (02/18 1621) BP: 138/68 (02/18 1621) Pulse Rate: 57 (02/18 1530)  Labs: Recent Labs    06/01/17 0452  06/02/17 0954 06/03/17 0232 06/03/17 1706  HGB 10.4*  --  11.5* 10.7*  --   HCT 32.5*  30.9*  --  36.0* 34.2*  --   PLT 142*  --  145* 144*  --   LABPROT 15.0  --   --   --   --   INR 1.19  --   --   --   --   HEPARINUNFRC  --    < > 0.55 0.58 0.70  CREATININE 1.03  --  1.06 1.02  --    < > = values in this interval not displayed.    Estimated Creatinine Clearance: 56 mL/min (by C-G formula based on SCr of 1.02 mg/dL).   Assessment: 64 YOM found to have L internal carotid artery bulb filling defect consistent with thrombosis- likely cause of embolic infarcts- Dr. Estanislado Pandy contacted and may do angiogram today.  Heparin level is supratherapeutic at 0.7 despite previous rate decrease. Hgb and plts stable, no bleeding noted. TEE pending for today - if neg, plan to switch to aspirin + clopidogrel at discharge tomorrow.  Goal of Therapy:  Heparin level 0.3-0.5 units/ml Monitor platelets by anticoagulation protocol: Yes   Plan:  Decrease heparin drip to 900 units/hr 8h heparin level, daily heparin level and CBC Follow for s/s bleeding Follow interventional plans   Renold Genta, PharmD, BCPS Clinical Pharmacist Phone for today - [place] - [phone] 06/03/2017 6:00 PM

## 2017-06-03 NOTE — Progress Notes (Addendum)
[facility]                                                                              Patient Demographics  Gregory Rodgers, is a 84 y.o. male, DOB - 07-24-1927, PYK:[id number]  Admit date - 06/01/2017   Admitting Physician Vianne Bulls, MD  Outpatient Primary MD for the patient is Melony Overly, MD  Outpatient specialists:   LOS - 1  days   Medical records reviewed and are as summarized below:    No chief complaint on file.      Brief summary   Patient is a 84 year old male with hypertension, CAD, chronic combined systolic and diastolic CHF, with ICD pacemaker presented to ED with intermittent slurring of speech, transient right eye blindness. Patient reported occasional problems with finding words for more than a year but has been more frequent over the past week and acutely worse a day before the admission. Also experienced transient blindness in the right eye initially a couple of years ago then again a day before the admission. CT head negative. Patient was admitted for further workup.   Assessment & Plan    Principal Problem:   Transient speech disturbance - Presented with transient slurred speech and difficulty word finding, visual disturbance in the right eye - CT head negative for acute stroke - MRI/MRA of the brain showed multiple small acute infarcts in the left MCA territory, irregular plaque proximal left ICA with 60% diameter narrowing. Filling defect in the left carotid bulb compatible with thrombus, likely cause of embolic infarctions in the left. No intracranial large vessel occlusion Discussed with neurology and patient was started on IV heparin drip - does not need carotid Dopplers with MRA neck - 2-D echo showed EF of 40-45%, normal wall motion, grade 1 diastolic dysfunction. Compared to prior echo in 53/97, systolic function has improved, ascending aortic aneurysm is unchanged.  - LDL 81, started on Lipitor 40 mg daily - Continue  aspirin per neuro recommendations -TEE planned today, per cards PA, Bhavinkumar Bhagat, the device was interrogated and no atrial fibrillation noted. -  Dr.  Estanislado Pandy consulted, plan for cerebral angiogram today  Addendum: 2:50PM D/w Dr Leonie Man, doesn't does not need cerebral angiogram or loop recorder, he canceled the 2 procedures. Recommended to continue with the TEE.   Active Problems:   CLL (chronic lymphocytic leukemia) (HCC) - WBC count improving, close to baseline - No infectious signs or symptoms.  UA negative for UTI. Chest x-ray showed no superimposed acute pathology per - Blood cultures negative so far     Essential hypertension - Currently stable, -Restart ACE inhibitor, beta-blocker still on hold    CAD S/P percutaneous coronary angioplasty,  Ischemic cardiomyopathy: EF 40-45%  now 30% - Continue aspirin, statin - 2-D echo showed EF of 40-45%, normal wall motion, grade 1 diastolic dysfunction. Compared to prior echo in 67/34, systolic function has improved, ascending aortic aneurysm is unchanged.    CODE STATUS: Full  DVT Prophylaxis:  Lovenox  Family Communication: Discussed in detail with the patient, all imaging results, lab results explained to the patient, wife and 2 daughters  at  the bedside    Disposition Plan: Once the workup is complete  Time Spent in minutes: 25 minute   Procedures:  CT head MRI/MRA brain   2-D echo  Consultants:   Neuro Cardiology Interventional radiology  Antimicrobials:      Medications  Scheduled Meds: . aspirin  300 mg Rectal Daily   Or  . aspirin  325 mg Oral Daily  . atorvastatin  40 mg Oral q1800  . loratadine  10 mg Oral Daily  . multivitamin with minerals  1 tablet Oral Daily  . omega-3 acid ethyl esters  1 g Oral Daily  . sodium chloride  1 spray Each Nare BID   Continuous Infusions: . heparin 1,050 Units/hr (06/03/17 1139)   PRN Meds:.acetaminophen **OR** acetaminophen (TYLENOL) oral liquid 160 mg/5 mL  **OR** acetaminophen, alum & mag hydroxide-simeth, senna-docusate   Antibiotics   Anti-infectives (From admission, onward)   None        Subjective:   Gregory Rodgers was seen and examined today. No complaints today, back to baseline, confirmed with family members at the bedside.  Speech improved, no facial drooping.  No new FND's.   Patient denies dizziness, chest pain, shortness of breath, abdominal pain, N/V/D/C. No acute events overnight.    Objective:   Vitals:   06/03/17 0131 06/03/17 0511 06/03/17 0800 06/03/17 1200  BP: (!) 158/69 (!) 146/69 (!) 147/67 (!) 172/63  Pulse: 66 (!) 58 61 70  Resp: 18 18 18 18   Temp: 98.4 F (36.9 C) 97.6 F (36.4 C) 98.4 F (36.9 C) 97.7 F (36.5 C)  TempSrc: Oral Oral Oral Oral  SpO2: 96% 98% 99% 100%  Weight:      Height:        Intake/Output Summary (Last 24 hours) at 06/03/2017 1322 Last data filed at 06/03/2017 1200 Gross per 24 hour  Intake 120 ml  Output -  Net 120 ml     Wt Readings from Last 3 Encounters:  06/01/17 80.6 kg (177 lb 11.1 oz)  04/10/17 80.2 kg (176 lb 12.8 oz)  01/10/17 77.1 kg (169 lb 15.6 oz)     Exam   General: Alert and oriented x 3, NAD, speech much clear today  Eyes:  HEENT:    Cardiovascular: S1 S2 auscultated,  Regular rate and rhythm. No pedal edema b/l  Respiratory: Clear to auscultation bilaterally, no wheezing, rales or rhonchi  Gastrointestinal: Soft, nontender, nondistended, + bowel sounds  Ext: no pedal edema bilaterally  Neuro: AAOx3, Cr N's II- XII. Strength 5/5 upper and lower extremities bilaterally, speech clear  Musculoskeletal: No digital cyanosis, clubbing  Skin: No rashes  Psych: Normal affect and demeanor, alert and oriented x3    Data Reviewed:  I have personally reviewed following labs and imaging studies  Micro Results Recent Results (from the past 240 hour(s))  MRSA PCR Screening     Status: None   Collection Time: 06/01/17  6:18 AM  Result Value  Ref Range Status   MRSA by PCR NEGATIVE NEGATIVE Final    Comment:        The GeneXpert MRSA Assay (FDA approved for NASAL specimens only), is one component of a comprehensive MRSA colonization surveillance program. It is not intended to diagnose MRSA infection nor to guide or monitor treatment for MRSA infections. Performed at [facility], [place] [address] [place], [place] [address]   Culture, blood (routine x 2)     Status: None (Preliminary result)   Collection Time: 06/01/17  8:58 AM  Result Value Ref Range Status   Specimen Description BLOOD RIGHT ARM  Final   Special Requests   Final    BOTTLES DRAWN AEROBIC AND ANAEROBIC Blood Culture adequate volume   Culture   Final    NO GROWTH 1 DAY Performed at [facility], [address], [place], [place] [address]    Report Status PENDING  Incomplete  Culture, blood (routine x 2)     Status: None (Preliminary result)   Collection Time: 06/01/17  9:07 AM  Result Value Ref Range Status   Specimen Description BLOOD RIGHT ARM  Final   Special Requests   Final    BOTTLES DRAWN AEROBIC AND ANAEROBIC Blood Culture adequate volume   Culture   Final    NO GROWTH 1 DAY Performed at [facility], [place] [address] [place], [place] [address]    Report Status PENDING  Incomplete    Radiology Reports Dg Chest 2 View  Result Date: 06/01/2017 CLINICAL DATA:  Elevated white count EXAM: CHEST  2 VIEW COMPARISON:  05/31/2017 FINDINGS: Left subclavian pacemaker/AICD noted with 3 leads as before. Lower lung volumes accentuating the vascularity centrally. Mild cardiac enlargement. No current CHF or edema. No significant focal airspace process, collapse or consolidation. Negative for effusion or pneumothorax. Aorta is atherosclerotic and tortuous. Degenerative changes noted spine. IMPRESSION: Lower lung volumes otherwise stable exam. No superimposed acute chest process. Electronically Signed   By: Jerilynn Mages.  Shick M.D.   On: 06/01/2017 10:48    Mr [name] Neck W Wo Contrast  Addendum Date: 06/01/2017   ADDENDUM REPORT: 06/01/2017 13:51 ADDENDUM: These results were called by telephone at the time of interpretation on 06/01/2017 at 1:51 pm to Dr. Nira Conn RAI , who verbally acknowledged these results. Electronically Signed   By: Franchot Gallo M.D.   On: 06/01/2017 13:51   Result Date: 06/01/2017 CLINICAL DATA:  Stroke EXAM: MR HEAD WITHOUT CONTRAST MR CIRCLE OF WILLIS WITHOUT CONTRAST MRA OF THE NECK WITHOUT AND WITH CONTRAST TECHNIQUE: Multiplanar, multiecho pulse sequences of the brain, circle of Willis and surrounding structures were obtained without intravenous contrast. Angiographic images of the neck were obtained using MRA technique without and with intravenous contrast. CONTRAST:  < 15 mL > MULTIHANCE GADOBENATE DIMEGLUMINE 529 MG/ML IV SOLN COMPARISON:  CT head 05/31/2017 FINDINGS: MR HEAD FINDINGS Brain: Multiple small areas of acute infarct in the left MCA territory. These involve the frontal and parietal white matter. Small acute areas of acute infarct in the left medial temporal lobe and in the left basal ganglia. No acute infarct on the right. Generalized atrophy. Negative for hydrocephalus. Mild chronic microvascular ischemic change in the white matter. Chronic infarcts in the cerebellum bilaterally. Negative for hemorrhage or mass. Vascular: Dilated and tortuous basilar artery. Normal arterial flow voids in the circle-of-Willis. Skull and upper cervical spine: Negative Sinuses/Orbits: Mild mucosal edema paranasal sinuses. Bilateral cataract removal. Other: None MR CIRCLE OF WILLIS FINDINGS Dilatation of the distal left vertebral artery and the basilar which are tortuous compatible with hypertension and atherosclerotic disease. No significant basilar stenosis. Both vertebral arteries contribute to the basilar with left vertebral dominant. Superior cerebellar and posterior cerebral arteries patent bilaterally. PICA patent bilaterally.  Internal carotid artery patent bilaterally without stenosis. Anterior and middle cerebral arteries patent without stenosis or large vessel occlusion. Negative for cerebral aneurysm. MRA NECK FINDINGS Antegrade flow in the carotid and vertebral arteries bilaterally Atherosclerotic plaque at the left carotid bifurcation with approximately 60% diameter  stenosis left internal carotid artery. Filling defect in the proximal left internal carotid artery compatible with thrombus. This is likely a cause of embolic infarcts on the left. Right carotid bifurcation widely patent without stenosis Dominant left vertebral artery. No significant vertebral stenosis bilaterally. 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 Irregular plaque proximal left internal carotid artery with 60% diameter narrowing. Filling defect in the left carotid bulb compatible with thrombus. This is likely a cause of embolic infarctions in the left MCA territory. Atrophy and chronic microvascular ischemia. No intracranial large vessel occlusion. Electronically Signed: By: Franchot Gallo M.D. On: 06/01/2017 13:28   Mr Brain Wo Contrast  Addendum Date: 06/01/2017   ADDENDUM REPORT: 06/01/2017 13:51 ADDENDUM: These results were called by telephone at the time of interpretation on 06/01/2017 at 1:51 pm to Dr. Nira Conn RAI , who verbally acknowledged these results. Electronically Signed   By: Franchot Gallo M.D.   On: 06/01/2017 13:51   Result Date: 06/01/2017 CLINICAL DATA:  Stroke EXAM: MR HEAD WITHOUT CONTRAST MR CIRCLE OF WILLIS WITHOUT CONTRAST MRA OF THE NECK WITHOUT AND WITH CONTRAST TECHNIQUE: Multiplanar, multiecho pulse sequences of the brain, circle of Willis and surrounding structures were obtained without intravenous contrast. Angiographic images of the neck were obtained using MRA technique without and with intravenous contrast. CONTRAST:  < 15 mL > MULTIHANCE GADOBENATE DIMEGLUMINE 529 MG/ML IV SOLN COMPARISON:  CT  head 05/31/2017 FINDINGS: MR HEAD FINDINGS Brain: Multiple small areas of acute infarct in the left MCA territory. These involve the frontal and parietal white matter. Small acute areas of acute infarct in the left medial temporal lobe and in the left basal ganglia. No acute infarct on the right. Generalized atrophy. Negative for hydrocephalus. Mild chronic microvascular ischemic change in the white matter. Chronic infarcts in the cerebellum bilaterally. Negative for hemorrhage or mass. Vascular: Dilated and tortuous basilar artery. Normal arterial flow voids in the circle-of-Willis. Skull and upper cervical spine: Negative Sinuses/Orbits: Mild mucosal edema paranasal sinuses. Bilateral cataract removal. Other: None MR CIRCLE OF WILLIS FINDINGS Dilatation of the distal left vertebral artery and the basilar which are tortuous compatible with hypertension and atherosclerotic disease. No significant basilar stenosis. Both vertebral arteries contribute to the basilar with left vertebral dominant. Superior cerebellar and posterior cerebral arteries patent bilaterally. PICA patent bilaterally. Internal carotid artery patent bilaterally without stenosis. Anterior and middle cerebral arteries patent without stenosis or large vessel occlusion. Negative for cerebral aneurysm. MRA NECK FINDINGS Antegrade flow in the carotid and vertebral arteries bilaterally Atherosclerotic plaque at the left carotid bifurcation with approximately 60% diameter stenosis left internal carotid artery. Filling defect in the proximal left internal carotid artery compatible with thrombus. This is likely a cause of embolic infarcts on the left. Right carotid bifurcation widely patent without stenosis Dominant left vertebral artery. No significant vertebral stenosis bilaterally. 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 Irregular plaque proximal left internal carotid artery with 60% diameter narrowing. Filling defect in the  left carotid bulb compatible with thrombus. This is likely a cause of embolic infarctions in the left MCA territory. Atrophy and chronic microvascular ischemia. No intracranial large vessel occlusion. Electronically Signed: By: Franchot Gallo M.D. On: 06/01/2017 13:28   Mr [name] Head Wo Contrast  Addendum Date: 06/01/2017   ADDENDUM REPORT: 06/01/2017 13:51 ADDENDUM: These results were called by telephone at the time of interpretation on 06/01/2017 at 1:51 pm to Dr. Nira Conn RAI , who verbally acknowledged these results. Electronically Signed  By: Franchot Gallo M.D.   On: 06/01/2017 13:51   Result Date: 06/01/2017 CLINICAL DATA:  Stroke EXAM: MR HEAD WITHOUT CONTRAST MR CIRCLE OF WILLIS WITHOUT CONTRAST MRA OF THE NECK WITHOUT AND WITH CONTRAST TECHNIQUE: Multiplanar, multiecho pulse sequences of the brain, circle of Willis and surrounding structures were obtained without intravenous contrast. Angiographic images of the neck were obtained using MRA technique without and with intravenous contrast. CONTRAST:  < 15 mL > MULTIHANCE GADOBENATE DIMEGLUMINE 529 MG/ML IV SOLN COMPARISON:  CT head 05/31/2017 FINDINGS: MR HEAD FINDINGS Brain: Multiple small areas of acute infarct in the left MCA territory. These involve the frontal and parietal white matter. Small acute areas of acute infarct in the left medial temporal lobe and in the left basal ganglia. No acute infarct on the right. Generalized atrophy. Negative for hydrocephalus. Mild chronic microvascular ischemic change in the white matter. Chronic infarcts in the cerebellum bilaterally. Negative for hemorrhage or mass. Vascular: Dilated and tortuous basilar artery. Normal arterial flow voids in the circle-of-Willis. Skull and upper cervical spine: Negative Sinuses/Orbits: Mild mucosal edema paranasal sinuses. Bilateral cataract removal. Other: None MR CIRCLE OF WILLIS FINDINGS Dilatation of the distal left vertebral artery and the basilar which are tortuous  compatible with hypertension and atherosclerotic disease. No significant basilar stenosis. Both vertebral arteries contribute to the basilar with left vertebral dominant. Superior cerebellar and posterior cerebral arteries patent bilaterally. PICA patent bilaterally. Internal carotid artery patent bilaterally without stenosis. Anterior and middle cerebral arteries patent without stenosis or large vessel occlusion. Negative for cerebral aneurysm. MRA NECK FINDINGS Antegrade flow in the carotid and vertebral arteries bilaterally Atherosclerotic plaque at the left carotid bifurcation with approximately 60% diameter stenosis left internal carotid artery. Filling defect in the proximal left internal carotid artery compatible with thrombus. This is likely a cause of embolic infarcts on the left. Right carotid bifurcation widely patent without stenosis Dominant left vertebral artery. No significant vertebral stenosis bilaterally. 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 Irregular plaque proximal left internal carotid artery with 60% diameter narrowing. Filling defect in the left carotid bulb compatible with thrombus. This is likely a cause of embolic infarctions in the left MCA territory. Atrophy and chronic microvascular ischemia. No intracranial large vessel occlusion. Electronically Signed: By: Franchot Gallo M.D. On: 06/01/2017 13:28    Lab Data:  CBC: Recent Labs  Lab 06/01/17 0452 06/02/17 0954 06/03/17 0232  WBC 40.9* 38.9* 37.1*  NEUTROABS 2.0  --   --   HGB 10.4* 11.5* 10.7*  HCT 32.5* 36.0* 34.2*  MCV 104.8* 105.6* 104.0*  PLT 142* 145* 284*   Basic Metabolic Panel: Recent Labs  Lab 06/01/17 0452 06/02/17 0954 06/03/17 0232  NA 140 142 140  K 4.1 4.1 4.5  CL 109 111 109  CO2 22 21* 23  GLUCOSE 106* 159* 104*  BUN 20 16 18   CREATININE 1.03 1.06 1.02  CALCIUM 8.4* 8.7* 8.4*   GFR: Estimated Creatinine Clearance: 56 mL/min (by C-G formula based on SCr of  1.02 mg/dL). Liver Function Tests: Recent Labs  Lab 06/01/17 0452  AST 18  ALT 12*  ALKPHOS 59  BILITOT 0.7  PROT 4.9*  ALBUMIN 3.2*   No results for input(s): LIPASE, AMYLASE in the last 168 hours. Recent Labs  Lab 06/01/17 0452  AMMONIA 31   Coagulation Profile: Recent Labs  Lab 06/01/17 0452  INR 1.19   Cardiac Enzymes: No results for input(s): CKTOTAL, CKMB, CKMBINDEX, TROPONINI in the last 168  hours. BNP (last 3 results) No results for input(s): PROBNP in the last 8760 hours. HbA1C: Recent Labs    06/01/17 0452  HGBA1C 6.0*   CBG: Recent Labs  Lab 06/01/17 2124  GLUCAP 97   Lipid Profile: Recent Labs    06/01/17 0452  CHOL 134  HDL 37*  LDLCALC 81  TRIG 80  CHOLHDL 3.6   Thyroid Function Tests: Recent Labs    06/01/17 0452  TSH 2.095   Anemia Panel: Recent Labs    06/01/17 0452  VITAMINB12 369   Urine analysis:    Component Value Date/Time   COLORURINE STRAW (A) 06/01/2017 0747   APPEARANCEUR CLEAR 06/01/2017 0747   LABSPEC 1.008 06/01/2017 [place] 7.0 06/01/2017 0747   GLUCOSEU NEGATIVE 06/01/2017 0747   HGBUR SMALL (A) 06/01/2017 0747   BILIRUBINUR NEGATIVE 06/01/2017 [place] 06/01/2017 0747   PROTEINUR NEGATIVE 06/01/2017 0747   NITRITE NEGATIVE 06/01/2017 0747   LEUKOCYTESUR NEGATIVE 06/01/2017 0747     Ripudeep Rai M.D. [facility] 06/03/2017, 1:22 PM  Pager: [phone] Between 7am to 7pm - call Pager - [phone]  After 7pm go to [email or web address] - password [email or web address]  Call night coverage person covering after 7pm

## 2017-06-03 NOTE — Plan of Care (Signed)
  Progressing Education: Knowledge of General Education information will improve 06/03/2017 1510 - Progressing by Darletta Moll, RN Health Behavior/Discharge Planning: Ability to manage health-related needs will improve 06/03/2017 1510 - Progressing by Darletta Moll, RN Clinical Measurements: Ability to maintain clinical measurements within normal limits will improve 06/03/2017 1510 - Progressing by Darletta Moll, RN Will remain free from infection 06/03/2017 1510 - Progressing by Darletta Moll, RN Diagnostic test results will improve 06/03/2017 1510 - Progressing by Darletta Moll, RN Respiratory complications will improve 06/03/2017 1510 - Progressing by Darletta Moll, RN Cardiovascular complication will be avoided 06/03/2017 1510 - Progressing by Darletta Moll, RN Education: Knowledge of disease or condition will improve 06/03/2017 1510 - Progressing by Darletta Moll, RN Knowledge of secondary prevention will improve 06/03/2017 1510 - Progressing by Darletta Moll, RN Knowledge of patient specific risk factors addressed and post discharge goals established will improve 06/03/2017 1510 - Progressing by Darletta Moll, RN Health Behavior/Discharge Planning: Ability to manage health-related needs will improve 06/03/2017 1510 - Progressing by Darletta Moll, RN Ischemic Stroke/TIA Tissue Perfusion: Complications of ischemic stroke/TIA will be minimized 06/03/2017 1510 - Progressing by Darletta Moll, RN

## 2017-06-03 NOTE — Progress Notes (Signed)
Patient off floor to Endo for TEE. 

## 2017-06-03 NOTE — Progress Notes (Signed)
Patient back in room from TEE in NAD.

## 2017-06-04 ENCOUNTER — Inpatient Hospital Stay (HOSPITAL_COMMUNITY): Payer: PPO

## 2017-06-04 ENCOUNTER — Encounter (HOSPITAL_COMMUNITY)
Admission: AD | Disposition: A | Discharge status: Home Health | Payer: Self-pay | Source: Other Acute Inpatient Hospital | Attending: Internal Medicine

## 2017-06-04 ENCOUNTER — Encounter (HOSPITAL_COMMUNITY): Payer: Self-pay | Admitting: Cardiology

## 2017-06-04 LAB — CBC
HCT: 35 % — ABNORMAL LOW (ref 39.0–52.0)
Hemoglobin: 11.2 g/dL — ABNORMAL LOW (ref 13.0–17.0)
MCH: 33.4 pg (ref 26.0–34.0)
MCHC: 32 g/dL (ref 30.0–36.0)
MCV: 104.5 fL — AB (ref 78.0–100.0)
Platelets: 145 10*3/uL — ABNORMAL LOW (ref 150–400)
RBC: 3.35 MIL/uL — ABNORMAL LOW (ref 4.22–5.81)
RDW: 13.9 % (ref 11.5–15.5)
WBC: 39.3 10*3/uL — ABNORMAL HIGH (ref 4.0–10.5)

## 2017-06-04 LAB — HEPARIN LEVEL (UNFRACTIONATED): Heparin Unfractionated: 0.48 IU/mL (ref 0.30–0.70)

## 2017-06-04 SURGERY — ECHOCARDIOGRAM, TRANSESOPHAGEAL
Anesthesia: Moderate Sedation

## 2017-06-04 MED ORDER — ATORVASTATIN CALCIUM 40 MG PO TABS: 40.0000 mg | ORAL_TABLET | Freq: Every day | ORAL | 3 refills | Status: DC

## 2017-06-04 MED ORDER — CLOPIDOGREL BISULFATE 75 MG PO TABS: 75.0000 mg | ORAL_TABLET | Freq: Every day | ORAL | 3 refills | Status: DC

## 2017-06-04 MED ORDER — CLOPIDOGREL BISULFATE 75 MG PO TABS
75.0000 mg | ORAL_TABLET | Freq: Every day | ORAL | Status: DC
  Administered 2017-06-04: 75 mg via ORAL
  Filled 2017-06-04: qty 1

## 2017-06-04 NOTE — Care Management Note (Signed)
Case Management Note  Patient Details  Name: Gregory Rodgers MRN: [id number] Date of Birth: 1927/05/15  Subjective/Objective:                    Action/Plan: Pt discharged home with orders for [facility] services. CM provided choice and they selected [facility]. [facility] not able to accept the referral. Several other [place] agencies contacted but do not service the area or did not have the staff.  CM was able to find that [place] that area and takes [facility]. Adacia with [facility] notified and accepted the referral. CM left voice mail for the patient and family. No further needs.   Expected Discharge Date:  06/04/17               Expected Discharge Plan:  [place]  In-House Referral:  NA  Discharge planning Services  CM Consult  Post Acute Care Choice:  Durable Medical Equipment Choice offered to:  Patient, Spouse, Adult Children  DME Arranged:  N/A DME Agency:  NA  HH Arranged:  PT, OT, Speech Therapy [place] Agency:  [facility]  Status of Service:  Completed, signed off  If discussed at [place] of Stay Meetings, dates discussed:    Additional Comments:  Pollie Friar, RN 06/04/2017, 4:07 PM

## 2017-06-04 NOTE — Progress Notes (Signed)
STROKE TEAM PROGRESS NOTE   HISTORY OF PRESENT ILLNESS (per record) Gregory Rodgers is an 84 y.o. 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 He also had transient left eye vision loss the day prior to arrival.  Patient is accompanied by his wife and daughters who assist with the history. They report that he has had occasional problems "finding words" for more than a year, but has been more frequent over the past week. Wife states that even before yesterday while at church patient had sudden onset left eye vision loss which he complained only after the got in the car. The patient is a poor historian but states that he remembers losing vision in the left eye is slightly painful. He denies headache, jaw claudication, joint pains. He has similar episode of vision loss in his left eye that lasted for a few days over a year ago. Today patient was wife are getting out of the car he seemed confused. He tried to talk with the words makes no sense. She got him into the house and noticed that he was dragging his right leg more than usual. Otherwise no facial droop was seen, it did not appear his right any extremity was weaker than normal. She called her daughter will try to talk to him on the phone but he was confused and not making sense and it was decided to call EMS.  He had a pacemaker placed in September 2018. [employer] Jude (serial # [id number] ) right atrial lead and a [employer] Jude (serial number [id number]) right ventricular defibrillator lead St. Jude(serial number [id number] [employer]. Jude (serial Number [id number]) biventricular ICD placed after he had symptomatic NSVT.    Date last known well: 2.14.19 tPA Given: no, outside window NIHSS: 1 Baseline MRS 0   SUBJECTIVE (INTERVAL HISTORY) His wife and daughters are at the  bedside.he is doing well and wants to go home. TEE showed no clot but PFO. Lower extremity venous Dopplers negative for clot   OBJECTIVE Temp:  [97.4 F (36.3 C)-99.1 F (37.3 C)] 98.2 F (36.8 C) (02/19 1128) Pulse Rate:  [57-87] 78 (02/19 1128) Cardiac Rhythm: Normal sinus rhythm (02/19 0801) Resp:  [18-19] 18 (02/19 1128) BP: (121-138)/(47-68) 121/56 (02/19 1128) SpO2:  [91 %-98 %] 97 % (02/19 1128)  CBC:  Recent Labs  Lab 06/01/17 0452  06/03/17 0232 06/04/17 0043  WBC 40.9*   < > 37.1* 39.3*  NEUTROABS 2.0  --   --   --   HGB 10.4*   < > 10.7* 11.2*  HCT 32.5*  30.9*   < > 34.2* 35.0*  MCV 104.8*   < > 104.0* 104.5*  PLT 142*   < > 144* 145*   < > = values in this interval not displayed.    Basic Metabolic Panel:  Recent Labs  Lab 06/02/17 0954 06/03/17 0232  NA 142 140  K 4.1 4.5  CL 111 109  CO2 21* 23  GLUCOSE 159* 104*  BUN 16 18  CREATININE 1.06 1.02  CALCIUM 8.7* 8.4*    Lipid Panel:     Component Value Date/Time   CHOL 134 06/01/2017 0452   TRIG 80 06/01/2017 0452   HDL 37 (L) 06/01/2017 0452   CHOLHDL 3.6 06/01/2017 0452   VLDL 16 06/01/2017 0452   LDLCALC 81 06/01/2017 0452   HgbA1c:  Lab  Results  Component Value Date   HGBA1C 6.0 (H) 06/01/2017   Urine Drug Screen: No results found for: LABOPIA, COCAINSCRNUR, LABBENZ, AMPHETMU, THCU, LABBARB  Alcohol Level No results found for: [facility]  IMAGING   Dg Chest 2 View 06/01/2017 IMPRESSION:  Lower lung volumes otherwise stable exam. No superimposed acute chest process.    Mr [name] Neck W Wo Contrast Mr [name] Wo Contrast 06/01/2017   IMPRESSION:  Multiple small acute infarcts in the left MCA territory.  Irregular plaque proximal left internal carotid artery with 60% diameter narrowing.  Filling defect in the left carotid bulb compatible with thrombus.  This is likely a cause of embolic infarctions in the left MCA territory.  Atrophy and chronic microvascular ischemia.  No  intracranial large vessel occlusion.     Transthoracic Echocardiogram  06/01/2017 Study Conclusions - Left ventricle: The cavity size was normal. There was severe   focal basal hypertrophy of the septum with otherwise mild   concentric hypertrophy. Systolic function was mildly to   moderately reduced. The estimated ejection fraction was in the   range of 40% to 45%. Wall motion was normal; there were no   regional wall motion abnormalities. Doppler parameters are   consistent with abnormal left ventricular relaxation (grade 1   diastolic dysfunction). - Aortic valve: Transvalvular velocity was within the normal range.   There was no stenosis. There was moderate regurgitation.   Regurgitation pressure half-time: 348 ms. - Aorta: Ascending aortic diameter: 42 mm (S). - Ascending aorta: The ascending aorta was mildly dilated. - Mitral valve: Transvalvular velocity was within the normal range.   There was no evidence for stenosis. There was mild regurgitation. - Left atrium: The atrium was mildly dilated. - Right ventricle: The cavity size was normal. Wall thickness was   normal. Systolic function was normal. - Tricuspid valve: There was trivial regurgitation. Impressions: - Compared with the prior echo 17/4081, systolic function has   improved. The ascending aortic aneurysm is unchanged.    PHYSICAL EXAM Vitals:   06/04/17 0013 06/04/17 0338 06/04/17 0800 06/04/17 1128  BP: (!) 136/52 (!) 129/58 138/68 (!) 121/56  Pulse: 75 61 (!) 58 78  Resp: 18 18 18 18   Temp: 99.1 F (37.3 C) 98.7 F (37.1 C) 98.6 F (37 C) 98.2 F (36.8 C)  TempSrc: Oral Oral Oral Oral  SpO2: 95% 98% 94% 97%  Weight:      Height:       PHYSICAL EXAM Physical exam: Exam: Gen: NAD Eyes: anicteric sclerae, moist conjunctivae                    CV: no MRG, no carotid bruits, no peripheral edema Mental Status: Alert, follows commands  Neuro: Detailed Neurologic Exam  Speech:    No aphasia, no  dysarthria  Cranial Nerves:    The pupils are equal, round, and reactive to light.. Attempted, Fundi not visualized.  EOMI. No gaze preference. Visual fields full. Face symmetric, Tongue midline. Hearing intact to voice. Shoulder shrug intact  Motor Observation:    no involuntary movements noted. Tone appears normal.     Strength:    5/5     Sensation:  Intact to LT  Plantars equiv .      ASSESSMENT/PLAN Gregory Rodgers is a 84 y.o. male with history of coronary artery disease with previous MI, left bundle branch block, borderline diabetes, hyperlipidemia, history of renal calculi, hypertension, chronic lymphocytic leukemia, CHF, cardiomyopathy with symptomatic  nonsustained V. tach, and status post placement of a biventricular pacemaker/AICD presenting with transient speech problems, confusion, transient visual loss, and right lower extremity weakness. He did not receive IV t-PA due to late presentation.  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  Resultant  Resolution of symptoms  CT head - not performed  MRI head - Multiple small acute infarcts in the left MCA territory.  MRA head - No intracranial large vessel occlusion.  MRA neck - non-occlusive Filling defect in the left carotid bulb compatible with thrombus.  Carotid Doppler - MRA neck  2D Echo - EF 40-45%. No cardiac source of emboli identified.  LDL - 81  HgbA1c - 6.0  VTE prophylaxis - intravenous heparin Fall precautions Diet Heart Room service appropriate? Yes; Fluid consistency: Thin Diet - low sodium heart healthy  aspirin 81 mg daily prior to admission, now on aspirin 325 mg daily and Heparin drip due to thrombus  Patient counseled to be compliant with his antithrombotic medications  Ongoing aggressive stroke risk factor management  Therapy recommendations:  pending  Disposition:  Pending  Hypertension  Stable  Permissive hypertension  (OK if < 220/120) but gradually normalize in 5-7 days  Long-term BP goal normotensive  Hyperlipidemia  Home meds: No lipid lowering medications prior to admission  LDL 81, goal < 70  Now on Lipitor 40 mg daily  Continue statin at discharge  Diabetes (borderline)  HgbA1c 6.0, goal < 7.0  Controlled  Other Stroke Risk Factors  Advanced age  Possible TIA history  Coronary artery disease   Other Active Problems  Cardiomyopathy / congestive heart failure / status post permanent pacemaker AICD  Blood cultures and urine culture pending  Anemia - chronic lymphocytic leukemia - WBCs - 40.9 - afebrile   Plan / Recommendations   Stroke workup Lt ICA thrombus -> IV heparin per pharmacy ->    switch IV heparin to aspirin and Plavix at discharge today Discussed with Dr. Tana Coast and Dr. Estanislado Pandy and patient's family Hospital day # 2  Agree with discharge home on dual antiplatelet therapy. Follow-up as an outpatient in stroke clinic in 6 weeks.   Antony Contras, MD Stroke Neurology  To contact Stroke Continuity provider, please refer to [email or web address] After hours, contact General Neurology

## 2017-06-04 NOTE — Progress Notes (Signed)
Physical Therapy Treatment Patient Details Name: Gregory Rodgers MRN: [id number] DOB: 06-Jan-1928 Today's Date: 06/04/2017    History of Present Illness Pt is an 84 y.o. male with PMH significant for hypertension, coronary artery disease, and chronic combined systolic and diastolic CHF with ICD. Had pacemaker placed in September 2018. He presented to the ED with transient word finding deficits and intermittent sudden onset L eye vision loss. MRI/MRA of the brain showed multiple small acute infarcts in the left MCA territory, irregular plaque proximal left ICA with 60% diameter narrowing. Filling defect in the left carotid bulb compatible with thrombus, likely cause of embolic infarctions in the left. No intracranial large vessel occlusion    PT Comments    Pt continues to make progress towards his goals and is much stronger with gait today, however he continues to show decreased balance and safety awareness. Pt is currently minA for sit>stand, ambulation of 300 feet with RW and ascent/descent of 10 stairs with handrail on R. Pt and family educated on need for pt to always have someone with him when he is moving around in his home. D/c plan remains appropriate. PT will continue to follow acutely until d/c.     Follow Up Recommendations  Home health PT;Supervision/Assistance - 24 hour     Equipment Recommendations  None recommended by PT       Precautions / Restrictions Precautions Precautions: Fall;ICD/Pacemaker Restrictions Weight Bearing Restrictions: No    Mobility  Bed Mobility               General bed mobility comments: OOB in chair at entry  Transfers Overall transfer level: Needs assistance Equipment used: 1 person hand held assist;Rolling walker (2 wheeled) Transfers: Sit to/from Stand Sit to Stand: Min assist         General transfer comment: Min assist for power up, required 2 attempts to come to upright and steadying in RW  Ambulation/Gait Ambulation/Gait  assistance: Min assist Ambulation Distance (Feet): 300 Feet Assistive device: Rolling walker (2 wheeled) Gait Pattern/deviations: Narrow base of support;Step-through pattern;Shuffle Gait velocity: decreased Gait velocity interpretation: Below normal speed for age/gender General Gait Details: minA for steadying with RW, pt much steadier with gait today, pt still requires cuing for proximity to RW and increased BoS, pt with improved foot clearance bilaterally    Stairs     Stair Management: Forwards;Step to pattern;One rail Right Number of Stairs: 10 General stair comments: minA for steadying with stairs, vc for sequencing, foot clearance and firm planting on step before beginning ascent with other foot, ,   Wheelchair Mobility    Modified Rankin (Stroke Patients Only) Modified Rankin (Stroke Patients Only) Pre-Morbid Rankin Score: Slight disability Modified Rankin: Moderately severe disability     Balance Overall balance assessment: Needs assistance Sitting-balance support: No upper extremity supported;Feet supported Sitting balance-Leahy Scale: Fair     Standing balance support: No upper extremity supported;Single extremity supported;During functional activity Standing balance-Leahy Scale: Poor Standing balance comment: Relies on single UE support.                             Cognition Arousal/Alertness: Awake/alert Behavior During Therapy: WFL for tasks assessed/performed Overall Cognitive Status: Difficult to assess Area of Impairment: Safety/judgement                         Safety/Judgement: Decreased awareness of safety;Decreased awareness of deficits     General  Comments: Pt slow to respond at times but difficult to fully assess cognition due to expressive deficits.       Exercises      General Comments General comments (skin integrity, edema, etc.): Wife and daughter present throughout session       Pertinent Vitals/Pain Pain  Assessment: No/denies pain           PT Goals (current goals can now be found in the care plan section) Acute Rehab PT Goals Patient Stated Goal: go home and get back to exercising PT Goal Formulation: With patient/family Time For Goal Achievement: 06/08/17 Potential to Achieve Goals: Good Progress towards PT goals: Progressing toward goals    Frequency    Min 4X/week      PT Plan Current plan remains appropriate       AM-PAC PT "6 Clicks" Daily Activity  Outcome Measure  Difficulty turning over in bed (including adjusting bedclothes, sheets and blankets)?: A Little Difficulty moving from lying on back to sitting on the side of the bed? : A Little Difficulty sitting down on and standing up from a chair with arms (e.g., wheelchair, bedside commode, etc,.)?: Unable Help needed moving to and from a bed to chair (including a wheelchair)?: A Little Help needed walking in hospital room?: A Little Help needed climbing 3-5 steps with a railing? : A Little 6 Click Score: 16    End of Session Equipment Utilized During Treatment: Gait belt Activity Tolerance: Patient tolerated treatment well Patient left: in chair;with call bell/phone within reach;with family/visitor present Nurse Communication: Mobility status PT Visit Diagnosis: Unsteadiness on feet (R26.81);Other abnormalities of gait and mobility (R26.89);Other symptoms and signs involving the nervous system (R29.898);Muscle weakness (generalized) (M62.81);Difficulty in walking, not elsewhere classified (R26.2)     Time: 6333-5456 PT Time Calculation (min) (ACUTE ONLY): 19 min  Charges:  $Gait Training: 8-22 mins                    G Codes:       Mccade Sullenberger B. Migdalia Dk PT, DPT Acute Rehabilitation  [phone] Pager [phone]     [place] 06/04/2017, 1:49 PM

## 2017-06-04 NOTE — Discharge Summary (Signed)
Physician Discharge Summary   Patient ID: Gregory Rodgers MRN: [id number] DOB/AGE: 11-09-1927 84 y.o.  Admit date: 06/01/2017 Discharge date: 06/04/2017  Primary Care Physician:  Melony Overly, MD  Discharge Diagnoses:       Acute CVA, left MCA territory . CLL (chronic lymphocytic leukemia) ([place]) . Essential hypertension . Ischemic cardiomyopathy: EF 40-45%  now 30% . Transient speech disturbance . Transient monocular blindness, right   Consults: Neurology Cardiology for TEE  Recommendations for Outpatient Follow-up:  1. Please repeat CBC/BMET at next visit  DIET: Heart healthy diet    Allergies:   Allergies  Allergen Reactions  . Tape Other (See Comments)    "PLASTIC" TAPE PULLS OFF THE SKIN AND THE REMOVAL OF ALL TAPES BRUISE HIM!!!     DISCHARGE MEDICATIONS: Allergies as of 06/04/2017      Reactions   Tape Other (See Comments)   "PLASTIC" TAPE PULLS OFF THE SKIN AND THE REMOVAL OF ALL TAPES BRUISE HIM!!!      Medication List    TAKE these medications   aspirin 81 MG tablet Take 81 mg by mouth daily.   atorvastatin 40 MG tablet Commonly known as:  LIPITOR Take 1 tablet (40 mg total) by mouth at bedtime.   clopidogrel 75 MG tablet Commonly known as:  PLAVIX Take 1 tablet (75 mg total) by mouth daily. Start taking on:  06/05/2017   co-enzyme Q-10 30 MG capsule Take 30 mg by mouth daily.   ibuprofen 200 MG tablet Commonly known as:  ADVIL,MOTRIN Take 200 mg by mouth every 6 (six) hours as needed for mild pain.   lisinopril 10 MG tablet Commonly known as:  PRINIVIL,ZESTRIL Take 1 tablet (10 mg total) by mouth daily. PLEASE CONTACT OFFICE FOR ADDITIONAL REFILLS What changed:  additional instructions   MAALOX MAX PO Take 5 mLs by mouth daily as needed (for an upset stomach).   MEGARED OMEGA-3 KRILL OIL 500 MG Caps Take 1 capsule by mouth daily.   metoprolol succinate 25 MG 24 hr tablet Commonly known as:  TOPROL XL Take 1 tablet (25 mg  total) by mouth daily.   multivitamin tablet Take 1 tablet by mouth daily.   Probiotic Caps Take 1 capsule by mouth every other day.        Brief H and P: For complete details please refer to admission H and P, but in brief Patient is a 84 year old male with hypertension, CAD, chronic combined systolic and diastolic CHF, with ICD pacemaker presented to ED with intermittent slurring of speech, transient right eye blindness. Patient reported occasional problems with finding words for more than a year but has been more frequent over the past week and acutely worse a day before the admission. Also experienced transient blindness in the right eye initially a couple of years ago then again a day before the admission. CT head negative. Patient was admitted for further workup.  Hospital Course:    Transient speech disturbance/acute CVA - Presented with transient slurred speech and difficulty word finding, visual disturbance in the right eye - CT head negative for acute stroke - MRI/MRA of the brain showed multiple small acute infarcts in the left MCA territory, irregular plaque proximal left ICA with 60% diameter narrowing. Filling defect in the left carotid bulb compatible with thrombus, likely cause of embolic infarctions in the left. No intracranial large vessel occlusion.  Patient was started on IV heparin drip by neurology. - does not need carotid Dopplers with MRA neck - 2-D  echo showed EF of 40-45%, normal wall motion, grade 1 diastolic dysfunction. Compared to prior echo in 66/06, systolic function has improved, ascending aortic aneurysm is unchanged.  - LDL 81, started on Lipitor 40 mg daily - Continue aspirin per neuro recommendations - Device was interrogated and no atrial fibrillation noted. -Per stroke service, Dr. Leonie Man, doesn't need cerebral angiogram -TEE showed normal right atrium, large PFO, bubble study markedly positive, trivial TR -Neurology recommended lower extremity  Dopplers, which were negative -Patient was discharged home on aspirin and Plavix    CLL (chronic lymphocytic leukemia) ([facility]) - WBC count improving, close to baseline - No infectious signs or symptoms.  UA negative for UTI. Chest x-ray showed no superimposed acute pathology per - Blood cultures negative so far     Essential hypertension - Currently stable, -Restart ACE inhibitor, beta-blocker still on hold    CAD S/P percutaneous coronary angioplasty,  Ischemic cardiomyopathy: EF 40-45%  now 30% - Continue aspirin, statin - 2-D echo showed EF of 40-45%, normal wall motion, grade 1 diastolic dysfunction. Compared to prior echo in 30/16, systolic function has improved, ascending aortic aneurysm is unchanged.     Day of Discharge BP (!) 121/56 (BP Location: Right Arm)   Pulse 78   Temp 98.2 F (36.8 C) (Oral)   Resp 18   Ht 6' (1.829 m)   Wt 80.3 kg (177 lb)   SpO2 97%   BMI 24.01 kg/m   Physical Exam: General: Alert and awake oriented x3 not in any acute distress. HEENT: anicteric sclera, pupils reactive to light and accommodation CVS: S1-S2 clear no murmur rubs or gallops Chest: clear to auscultation bilaterally, no wheezing rales or rhonchi Abdomen: soft nontender, nondistended, normal bowel sounds Extremities: no cyanosis, clubbing or edema noted bilaterally Neuro: Cranial nerves II-XII intact, no focal neurological deficits   The results of significant diagnostics from this hospitalization (including imaging, microbiology, ancillary and laboratory) are listed below for reference.    LAB RESULTS: Basic Metabolic Panel: Recent Labs  Lab 06/02/17 0954 06/03/17 0232  NA 142 140  K 4.1 4.5  CL 111 109  CO2 21* 23  GLUCOSE 159* 104*  BUN 16 18  CREATININE 1.06 1.02  CALCIUM 8.7* 8.4*   Liver Function Tests: Recent Labs  Lab 06/01/17 0452  AST 18  ALT 12*  ALKPHOS 59  BILITOT 0.7  PROT 4.9*  ALBUMIN 3.2*   No results for input(s): LIPASE, AMYLASE in  the last 168 hours. Recent Labs  Lab 06/01/17 0452  AMMONIA 31   CBC: Recent Labs  Lab 06/01/17 0452  06/03/17 0232 06/04/17 0043  WBC 40.9*   < > 37.1* 39.3*  NEUTROABS 2.0  --   --   --   HGB 10.4*   < > 10.7* 11.2*  HCT 32.5*  30.9*   < > 34.2* 35.0*  MCV 104.8*   < > 104.0* 104.5*  PLT 142*   < > 144* 145*   < > = values in this interval not displayed.   Cardiac Enzymes: No results for input(s): CKTOTAL, CKMB, CKMBINDEX, TROPONINI in the last 168 hours. BNP: Invalid input(s): POCBNP CBG: Recent Labs  Lab 06/01/17 2124  GLUCAP 97    Significant Diagnostic Studies:  Dg Chest 2 View  Result Date: 06/01/2017 CLINICAL DATA:  Elevated white count EXAM: CHEST  2 VIEW COMPARISON:  05/31/2017 FINDINGS: Left subclavian pacemaker/AICD noted with 3 leads as before. Lower lung volumes accentuating the vascularity centrally. Mild cardiac enlargement. No  current CHF or edema. No significant focal airspace process, collapse or consolidation. Negative for effusion or pneumothorax. Aorta is atherosclerotic and tortuous. Degenerative changes noted spine. IMPRESSION: Lower lung volumes otherwise stable exam. No superimposed acute chest process. Electronically Signed   By: Jerilynn Mages.  Shick M.D.   On: 06/01/2017 10:48   Mr [name] Neck W Wo Contrast  Addendum Date: 06/01/2017   ADDENDUM REPORT: 06/01/2017 13:51 ADDENDUM: These results were called by telephone at the time of interpretation on 06/01/2017 at 1:51 pm to Dr. Nira Conn Beckham Capistran , who verbally acknowledged these results. Electronically Signed   By: Franchot Gallo M.D.   On: 06/01/2017 13:51   Result Date: 06/01/2017 CLINICAL DATA:  Stroke EXAM: MR HEAD WITHOUT CONTRAST MR CIRCLE OF WILLIS WITHOUT CONTRAST MRA OF THE NECK WITHOUT AND WITH CONTRAST TECHNIQUE: Multiplanar, multiecho pulse sequences of the brain, circle of Willis and surrounding structures were obtained without intravenous contrast. Angiographic images of the neck were obtained using MRA  technique without and with intravenous contrast. CONTRAST:  < 15 mL > MULTIHANCE GADOBENATE DIMEGLUMINE 529 MG/ML IV SOLN COMPARISON:  CT head 05/31/2017 FINDINGS: MR HEAD FINDINGS Brain: Multiple small areas of acute infarct in the left MCA territory. These involve the frontal and parietal white matter. Small acute areas of acute infarct in the left medial temporal lobe and in the left basal ganglia. No acute infarct on the right. Generalized atrophy. Negative for hydrocephalus. Mild chronic microvascular ischemic change in the white matter. Chronic infarcts in the cerebellum bilaterally. Negative for hemorrhage or mass. Vascular: Dilated and tortuous basilar artery. Normal arterial flow voids in the circle-of-Willis. Skull and upper cervical spine: Negative Sinuses/Orbits: Mild mucosal edema paranasal sinuses. Bilateral cataract removal. Other: None MR CIRCLE OF WILLIS FINDINGS Dilatation of the distal left vertebral artery and the basilar which are tortuous compatible with hypertension and atherosclerotic disease. No significant basilar stenosis. Both vertebral arteries contribute to the basilar with left vertebral dominant. Superior cerebellar and posterior cerebral arteries patent bilaterally. PICA patent bilaterally. Internal carotid artery patent bilaterally without stenosis. Anterior and middle cerebral arteries patent without stenosis or large vessel occlusion. Negative for cerebral aneurysm. MRA NECK FINDINGS Antegrade flow in the carotid and vertebral arteries bilaterally Atherosclerotic plaque at the left carotid bifurcation with approximately 60% diameter stenosis left internal carotid artery. Filling defect in the proximal left internal carotid artery compatible with thrombus. This is likely a cause of embolic infarcts on the left. Right carotid bifurcation widely patent without stenosis Dominant left vertebral artery. No significant vertebral stenosis bilaterally. IMPRESSION: Multiple small acute  infarcts in the left MCA territory. Irregular plaque proximal left internal carotid artery with 60% diameter narrowing. Filling defect in the left carotid bulb compatible with thrombus. This is likely a cause of embolic infarctions in the left MCA territory. Atrophy and chronic microvascular ischemia. No intracranial large vessel occlusion. Electronically Signed: By: Franchot Gallo M.D. On: 06/01/2017 13:28   Mr Brain Wo Contrast  Addendum Date: 06/01/2017   ADDENDUM REPORT: 06/01/2017 13:51 ADDENDUM: These results were called by telephone at the time of interpretation on 06/01/2017 at 1:51 pm to Dr. Nira Conn Madylyn Insco , who verbally acknowledged these results. Electronically Signed   By: Franchot Gallo M.D.   On: 06/01/2017 13:51   Result Date: 06/01/2017 CLINICAL DATA:  Stroke EXAM: MR HEAD WITHOUT CONTRAST MR CIRCLE OF WILLIS WITHOUT CONTRAST MRA OF THE NECK WITHOUT AND WITH CONTRAST TECHNIQUE: Multiplanar, multiecho pulse sequences of the brain, circle of Willis and surrounding structures were  obtained without intravenous contrast. Angiographic images of the neck were obtained using MRA technique without and with intravenous contrast. CONTRAST:  < 15 mL > MULTIHANCE GADOBENATE DIMEGLUMINE 529 MG/ML IV SOLN COMPARISON:  CT head 05/31/2017 FINDINGS: MR HEAD FINDINGS Brain: Multiple small areas of acute infarct in the left MCA territory. These involve the frontal and parietal white matter. Small acute areas of acute infarct in the left medial temporal lobe and in the left basal ganglia. No acute infarct on the right. Generalized atrophy. Negative for hydrocephalus. Mild chronic microvascular ischemic change in the white matter. Chronic infarcts in the cerebellum bilaterally. Negative for hemorrhage or mass. Vascular: Dilated and tortuous basilar artery. Normal arterial flow voids in the circle-of-Willis. Skull and upper cervical spine: Negative Sinuses/Orbits: Mild mucosal edema paranasal sinuses. Bilateral cataract  removal. Other: None MR CIRCLE OF WILLIS FINDINGS Dilatation of the distal left vertebral artery and the basilar which are tortuous compatible with hypertension and atherosclerotic disease. No significant basilar stenosis. Both vertebral arteries contribute to the basilar with left vertebral dominant. Superior cerebellar and posterior cerebral arteries patent bilaterally. PICA patent bilaterally. Internal carotid artery patent bilaterally without stenosis. Anterior and middle cerebral arteries patent without stenosis or large vessel occlusion. Negative for cerebral aneurysm. MRA NECK FINDINGS Antegrade flow in the carotid and vertebral arteries bilaterally Atherosclerotic plaque at the left carotid bifurcation with approximately 60% diameter stenosis left internal carotid artery. Filling defect in the proximal left internal carotid artery compatible with thrombus. This is likely a cause of embolic infarcts on the left. Right carotid bifurcation widely patent without stenosis Dominant left vertebral artery. No significant vertebral stenosis bilaterally. 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 Irregular plaque proximal left internal carotid artery with 60% diameter narrowing. Filling defect in the left carotid bulb compatible with thrombus. This is likely a cause of embolic infarctions in the left MCA territory. Atrophy and chronic microvascular ischemia. No intracranial large vessel occlusion. Electronically Signed: By: Franchot Gallo M.D. On: 06/01/2017 13:28   Mr [name] Head Wo Contrast  Addendum Date: 06/01/2017   ADDENDUM REPORT: 06/01/2017 13:51 ADDENDUM: These results were called by telephone at the time of interpretation on 06/01/2017 at 1:51 pm to Dr. Nira Conn Jacari Kirsten , who verbally acknowledged these results. Electronically Signed   By: Franchot Gallo M.D.   On: 06/01/2017 13:51   Result Date: 06/01/2017 CLINICAL DATA:  Stroke EXAM: MR HEAD WITHOUT CONTRAST MR CIRCLE OF WILLIS  WITHOUT CONTRAST MRA OF THE NECK WITHOUT AND WITH CONTRAST TECHNIQUE: Multiplanar, multiecho pulse sequences of the brain, circle of Willis and surrounding structures were obtained without intravenous contrast. Angiographic images of the neck were obtained using MRA technique without and with intravenous contrast. CONTRAST:  < 15 mL > MULTIHANCE GADOBENATE DIMEGLUMINE 529 MG/ML IV SOLN COMPARISON:  CT head 05/31/2017 FINDINGS: MR HEAD FINDINGS Brain: Multiple small areas of acute infarct in the left MCA territory. These involve the frontal and parietal white matter. Small acute areas of acute infarct in the left medial temporal lobe and in the left basal ganglia. No acute infarct on the right. Generalized atrophy. Negative for hydrocephalus. Mild chronic microvascular ischemic change in the white matter. Chronic infarcts in the cerebellum bilaterally. Negative for hemorrhage or mass. Vascular: Dilated and tortuous basilar artery. Normal arterial flow voids in the circle-of-Willis. Skull and upper cervical spine: Negative Sinuses/Orbits: Mild mucosal edema paranasal sinuses. Bilateral cataract removal. Other: None MR CIRCLE OF WILLIS FINDINGS Dilatation of the distal left vertebral artery and  the basilar which are tortuous compatible with hypertension and atherosclerotic disease. No significant basilar stenosis. Both vertebral arteries contribute to the basilar with left vertebral dominant. Superior cerebellar and posterior cerebral arteries patent bilaterally. PICA patent bilaterally. Internal carotid artery patent bilaterally without stenosis. Anterior and middle cerebral arteries patent without stenosis or large vessel occlusion. Negative for cerebral aneurysm. MRA NECK FINDINGS Antegrade flow in the carotid and vertebral arteries bilaterally Atherosclerotic plaque at the left carotid bifurcation with approximately 60% diameter stenosis left internal carotid artery. Filling defect in the proximal left internal  carotid artery compatible with thrombus. This is likely a cause of embolic infarcts on the left. Right carotid bifurcation widely patent without stenosis Dominant left vertebral artery. No significant vertebral stenosis bilaterally. 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 Irregular plaque proximal left internal carotid artery with 60% diameter narrowing. Filling defect in the left carotid bulb compatible with thrombus. This is likely a cause of embolic infarctions in the left MCA territory. Atrophy and chronic microvascular ischemia. No intracranial large vessel occlusion. Electronically Signed: By: Franchot Gallo M.D. On: 06/01/2017 13:28    TEE  Indication: CVA  Findings: Please see echo section for full report.  Normal LV size with moderate focal basal septal hypertrophy.  EF 45-50%, diffuse hypokinesis. Normal RV size and systolic function.  Pacemaker wire in RV.  Mild left atrial enlargement, no LA appendage thrombus.  Nomal right atrium.  There was a large PFO, bubble study markedly positive.  Trivial tricuspid regurgitation.  Trivial mitral regurgitation.  Trileaflet aortic valve with no stenosis, moderate regurgitation.  Normal caliber but tortuous thoracic aorta with grade III plaque in the descending thoracic aorta.   Source of embolus: PFO.     Disposition and Follow-up: Discharge Instructions    Diet - low sodium heart healthy   Complete by:  As directed    Increase activity slowly   Complete by:  As directed        DISPOSITION: Home   DISCHARGE FOLLOW-UP Follow-up Information    Garvin Fila, MD. Schedule an appointment as soon as possible for a visit in 6 week(s).   Specialties:  Neurology, Radiology Contact information: [address] [place] [address] [phone]        Melony Overly, MD. Schedule an appointment as soon as possible for a visit in 2 week(s).   Specialty:  Family Medicine Contact  information: [place] [phone]            Time spent on Discharge: 35 minutes  Signed:   Estill Cotta M.D. [facility] 06/04/2017, 2:52 PM Pager: [phone]

## 2017-06-04 NOTE — Progress Notes (Signed)
Bilateral lower extremity venous duplex completed. No evidence of DVT, superficial thrombosis, or Baker's cyst. Toma Copier, RVS 06/04/2017, 10:50 AM

## 2017-06-04 NOTE — Evaluation (Addendum)
Speech Language Pathology Evaluation Patient Details Name: Gregory Rodgers MRN: [id number] DOB: February 27, 1928 Today's Date: 06/04/2017 Time: 9242-6834 SLP Time Calculation (min) (ACUTE ONLY): 18 min  Problem List:  Patient Active Problem List   Diagnosis Date Noted  . Transient speech disturbance 06/01/2017  . 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 Cerebral embolism with cerebral infarction 06/01/2017  . Polymorphic ventricular tachycardia ([place]) 01/09/2017  . NSVT (nonsustained ventricular tachycardia) ([place]) 05/01/2016  . Left bundle branch block (LBBB) on electrocardiogram 02/27/2016  . Dizziness of unknown cause 02/27/2016  . Bradycardia with 41-50 beats per minute 02/27/2016  . Ischemic cardiomyopathy: EF 40-45%  now 30% 02/27/2016  . Pancytopenia, acquired ([place]) 09/30/2015  . Thrombocytopenia ([place]) 10/01/2014  . 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 CLL (chronic lymphocytic leukemia) ([place]) 06/20/2011  . Hyperlipidemia 06/20/2011  . Essential hypertension 06/20/2011  . Borderline diabetes 06/20/2011  . CAD S/P percutaneous coronary angioplasty 02/26/1985   Past Medical History:  Past Medical History:  Diagnosis Date  . Basal cell carcinoma    "numerous places"  . Borderline diabetes   . Cardiomyopathy, dilated ([place]) 03/2016   Unclear etiology  . CLL (chronic lymphocytic leukemia) ([place]) 09/2009   stage 0; on observation  . Coronary artery disease involving native heart without angina pectoris    PTCA of RCA in setting of an MI in 1986-87; no follow-up study since available.  . Essential hypertension   . History of kidney stones   . Hyperlipidemia with target LDL less than 70    With known CAD  . Left bundle branch block (LBBB) on electrocardiogram    Noted as far back as 2008.  . 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 BASAL CELL  CARCINOMA EXCISION     "numerous"  . BIV ICD INSERTION CRT-D N/A 01/09/2017   Procedure: BIV ICD INSERTION CRT-D;  Surgeon: Evans Lance, MD;  Location: [facility];  Service: Cardiovascular;  Laterality: N/A;  . CATARACT EXTRACTION W/ INTRAOCULAR LENS  IMPLANT, BILATERAL Bilateral   . CORONARY ANGIOPLASTY  1986/1987  . EP IMPLANTABLE DEVICE N/A 05/10/2016   Procedure: Loop Recorder Insertion;  Surgeon: Evans Lance, MD;  Location: [facility];  Service: Cardiovascular;  Laterality: N/A;  slight blood drop on dressing  . JOINT REPLACEMENT    . MOHS SURGERY Left 01/02/2017   ear  . REPLACEMENT TOTAL KNEE Left   . TRANSTHORACIC ECHOCARDIOGRAM  03/2016   Normal LV size with mild LV hypertrophy. EF 30%. Diffuse  hypokinesis wtih septal-lateral dyssynchrony. Normal RV size and systolic function. Mild MR and mild AI.   HPI:  Pt is an 84 y.o. male with PMH significant for hypertension, coronary artery disease, MI and chronic combined systolic and diastolic CHF with ICD. Had pacemaker placed in September 2018. Pt presented to the ED with transient word finding deficits and intermittent sudden onset L eye vision loss. MRI Multiple small acute infarcts in the left MCA territory. Irregular plaque proximal left internal carotid artery with 60% diameter narrowing. Filling defect in the left carotid bulb compatible with thrombus   Assessment / Plan / Recommendation Clinical Impression  Pt has intermittent dysnomia and minimal working memory impairments prior to admission. Lives with supportive wife and able to assist in financial decision making and medication management but is not sole responsibility. He is active around  and outside the house. Mild dysnomia present during assessment which he compensates for by using synonyms observed this session. Comprehension for basic information and mildy complex within functional limits. Suspect may have difficulty with moderately or complex problem  solving without support. Unable to formally assess working memory as transport arrived. Pt, wife/daughters and ST in agreement for additional Speech therapy with home health. Briefly educated pt/family re: strategies to facilitate word finding impairments and memory. Recommend ST to continue in home setting.       SLP Assessment  SLP Recommendation/Assessment: All further Speech Lanaguage Pathology  needs can be addressed in the next venue of care SLP Visit Diagnosis: Cognitive communication deficit (R41.841)    Follow Up Recommendations  Home health SLP    Frequency and Duration           SLP Evaluation Cognition  Overall Cognitive Status: History of cognitive impairments - at baseline(min memory) Arousal/Alertness: Awake/alert Orientation Level: Oriented to person;Oriented to place;Oriented to situation(did not assess time) Attention: Focused Focused Attention: Appears intact Memory: (not formally assessed. leaving for xray) Awareness: Appears intact Problem Solving: Impaired Problem Solving Impairment: (suspect functional moderately complex) Safety/Judgment: Other (comment)(questionable)       Comprehension  Auditory Comprehension Overall Auditory Comprehension: Appears within functional limits for tasks assessed Commands: Within Functional Limits Visual Recognition/Discrimination Discrimination: Not tested Reading Comprehension Reading Status: Not tested    Expression Expression Primary Mode of Expression: Verbal Verbal Expression Overall Verbal Expression: Impaired Initiation: No impairment Level of Generative/Spontaneous Verbalization: Sentence Repetition: (NT) Naming: No impairment Pragmatics: No impairment Written Expression Dominant Hand: Right Written Expression: Not tested   Oral / Motor  Oral Motor/Sensory Function Overall Oral Motor/Sensory Function: Within functional limits Motor Speech Overall Motor Speech: Appears within functional limits for tasks  assessed Respiration: Within functional limits Phonation: Normal Resonance: Within functional limits Articulation: Within functional limitis Intelligibility: Intelligible Motor Planning: Witnin functional limits   GO                    Houston Siren 06/04/2017, 10:07 AM   Orbie Pyo Colvin Caroli.Ed [employer] [phone]

## 2017-06-04 NOTE — Progress Notes (Signed)
[place] for heparin Indication:   Carotid bulb artery thrombosis   Allergies  Allergen Reactions  . Tape Other (See Comments)    "PLASTIC" TAPE PULLS OFF THE SKIN AND THE REMOVAL OF ALL TAPES BRUISE HIM!!!    Patient Measurements: Height: 6' (182.9 cm) Weight: 177 lb (80.3 kg) IBW/kg (Calculated) : 77.6 Heparin Dosing Weight: 80.6kg  Vital Signs: Temp: 99.1 F (37.3 C) (02/19 0013) Temp Source: Oral (02/19 0013) BP: 136/52 (02/19 0013) Pulse Rate: 75 (02/19 0013)  Labs: Recent Labs    06/01/17 0452  06/02/17 0954 06/03/17 0232 06/03/17 1706 06/04/17 0043  HGB 10.4*  --  11.5* 10.7*  --  11.2*  HCT 32.5*  30.9*  --  36.0* 34.2*  --  35.0*  PLT 142*  --  145* 144*  --  145*  LABPROT 15.0  --   --   --   --   --   INR 1.19  --   --   --   --   --   HEPARINUNFRC  --    < > 0.55 0.58 0.70 0.48  CREATININE 1.03  --  1.06 1.02  --   --    < > = values in this interval not displayed.    Estimated Creatinine Clearance: 56 mL/min (by C-G formula based on SCr of 1.02 mg/dL).   Assessment: 84 YO Male with LICA thrombosis for heparin  Goal of Therapy:  Heparin level 0.3-0.5 units/ml Monitor platelets by anticoagulation protocol: Yes   Plan:  Continue Heparin at current rate   Phillis Knack, PharmD, BCPS  06/04/2017 1:26 AM

## 2017-06-04 NOTE — Plan of Care (Signed)
Ambulating with min assist walker

## 2017-06-04 NOTE — Consult Note (Signed)
            [facility] CM Primary Care Navigator  06/04/2017  VANE YAPP 01-22-1928 [id number]   Wentto seepatient at the bedsideto identify possible discharge needs but he was alreadydischargedper staff report.  Patient was discharged home today with home health services.  Patient has a discharge instruction to follow-up with primary care provider as soon as possible for a visit in2 weeks and follow-up with neurology in 6 weeks.  Noted that order for EMMI Stroke calls to follow-up recovery at home is already in place.   For additional questions please contact:  Edwena Felty A. Jovon Streetman, BSN, RN-BC [facility] PRIMARY CARE Navigator Cell: [phone]

## 2017-06-06 LAB — CULTURE, BLOOD (ROUTINE X 2)
CULTURE: NO GROWTH
Culture: NO GROWTH
Special Requests: ADEQUATE
Special Requests: ADEQUATE

## 2017-06-06 NOTE — Care Management (Signed)
06/06/2017 at 1611: Pts family called CM and they are not happy with their [facility]. They have not received a visit yet and would like to change agencies. They would like to use [facility]. CM spoke to [facility] and they cant see the patient until Monday. Family is in agreement with this. Orders faxed to [facility]. CM then called [facility] and canceled their services.

## 2017-06-10 DIAGNOSIS — I69322 Dysarthria following cerebral infarction: Secondary | ICD-10-CM | POA: Diagnosis not present

## 2017-06-10 DIAGNOSIS — N182 Chronic kidney disease, stage 2 (mild): Secondary | ICD-10-CM | POA: Diagnosis not present

## 2017-06-10 DIAGNOSIS — C9110 Chronic lymphocytic leukemia of B-cell type not having achieved remission: Secondary | ICD-10-CM | POA: Diagnosis not present

## 2017-06-10 DIAGNOSIS — Z7982 Long term (current) use of aspirin: Secondary | ICD-10-CM | POA: Diagnosis not present

## 2017-06-10 DIAGNOSIS — I639 Cerebral infarction, unspecified: Secondary | ICD-10-CM | POA: Diagnosis not present

## 2017-06-10 DIAGNOSIS — E785 Hyperlipidemia, unspecified: Secondary | ICD-10-CM | POA: Diagnosis not present

## 2017-06-10 DIAGNOSIS — H547 Unspecified visual loss: Secondary | ICD-10-CM | POA: Diagnosis not present

## 2017-06-10 DIAGNOSIS — Z9189 Other specified personal risk factors, not elsewhere classified: Secondary | ICD-10-CM | POA: Diagnosis not present

## 2017-06-10 DIAGNOSIS — I447 Left bundle-branch block, unspecified: Secondary | ICD-10-CM | POA: Diagnosis not present

## 2017-06-10 DIAGNOSIS — I69320 Aphasia following cerebral infarction: Secondary | ICD-10-CM | POA: Diagnosis not present

## 2017-06-10 DIAGNOSIS — I110 Hypertensive heart disease with heart failure: Secondary | ICD-10-CM | POA: Diagnosis not present

## 2017-06-10 DIAGNOSIS — I255 Ischemic cardiomyopathy: Secondary | ICD-10-CM | POA: Diagnosis not present

## 2017-06-10 DIAGNOSIS — Z79899 Other long term (current) drug therapy: Secondary | ICD-10-CM | POA: Diagnosis not present

## 2017-06-10 DIAGNOSIS — I2510 Atherosclerotic heart disease of native coronary artery without angina pectoris: Secondary | ICD-10-CM | POA: Diagnosis not present

## 2017-06-10 DIAGNOSIS — I1310 Hypertensive heart and chronic kidney disease without heart failure, with stage 1 through stage 4 chronic kidney disease, or unspecified chronic kidney disease: Secondary | ICD-10-CM | POA: Diagnosis not present

## 2017-06-10 DIAGNOSIS — I5042 Chronic combined systolic (congestive) and diastolic (congestive) heart failure: Secondary | ICD-10-CM | POA: Diagnosis not present

## 2017-06-10 DIAGNOSIS — D630 Anemia in neoplastic disease: Secondary | ICD-10-CM | POA: Diagnosis not present

## 2017-06-10 DIAGNOSIS — I69398 Other sequelae of cerebral infarction: Secondary | ICD-10-CM | POA: Diagnosis not present

## 2017-06-10 DIAGNOSIS — C9190 Lymphoid leukemia, unspecified not having achieved remission: Secondary | ICD-10-CM | POA: Diagnosis not present

## 2017-06-10 DIAGNOSIS — M6281 Muscle weakness (generalized): Secondary | ICD-10-CM | POA: Diagnosis not present

## 2017-06-11 ENCOUNTER — Telehealth: Payer: Self-pay | Admitting: Neurology

## 2017-06-11 NOTE — Telephone Encounter (Signed)
[place] request PT 3 x 2, 2 x 2 for gait training, balance, strength, exercising. Please call to advise

## 2017-06-11 NOTE — Telephone Encounter (Signed)
Rn spoke with Laveda Abbe with [facility] at [phone]. Rn stated per Dr. Leonie Man orders can be given for PT 3x2, and 2x2 for gait training, balance, strength, and exercising. Laveda Abbe verbalized understanding.

## 2017-06-18 ENCOUNTER — Telehealth: Payer: Self-pay | Admitting: Adult Health

## 2017-06-18 NOTE — Telephone Encounter (Signed)
Gregory Rodgers. Pt called in to sched a f/u from the hospital-there's no referral in the system, but Dr. Clydene Fake notes says for him to be seen in 6 weeks. Would it be ok to schedule them w/ you?

## 2017-06-18 NOTE — Telephone Encounter (Signed)
If Dr. Leonie Man has an open appointment please schedule with him. If not, you can schedule with me in 6 weeks on a week that Dr. Leonie Man will be here. Thank you!

## 2017-07-04 DIAGNOSIS — S46111A Strain of muscle, fascia and tendon of long head of biceps, right arm, initial encounter: Secondary | ICD-10-CM | POA: Diagnosis not present

## 2017-07-08 DIAGNOSIS — C9190 Lymphoid leukemia, unspecified not having achieved remission: Secondary | ICD-10-CM | POA: Diagnosis not present

## 2017-07-08 DIAGNOSIS — N182 Chronic kidney disease, stage 2 (mild): Secondary | ICD-10-CM | POA: Diagnosis not present

## 2017-07-08 DIAGNOSIS — E785 Hyperlipidemia, unspecified: Secondary | ICD-10-CM | POA: Diagnosis not present

## 2017-07-08 DIAGNOSIS — I1310 Hypertensive heart and chronic kidney disease without heart failure, with stage 1 through stage 4 chronic kidney disease, or unspecified chronic kidney disease: Secondary | ICD-10-CM | POA: Diagnosis not present

## 2017-07-08 DIAGNOSIS — Z79899 Other long term (current) drug therapy: Secondary | ICD-10-CM | POA: Diagnosis not present

## 2017-07-08 DIAGNOSIS — Q72812 Congenital shortening of left lower limb: Secondary | ICD-10-CM | POA: Diagnosis not present

## 2017-07-08 DIAGNOSIS — Z9189 Other specified personal risk factors, not elsewhere classified: Secondary | ICD-10-CM | POA: Diagnosis not present

## 2017-07-08 DIAGNOSIS — I639 Cerebral infarction, unspecified: Secondary | ICD-10-CM | POA: Diagnosis not present

## 2017-07-08 NOTE — Telephone Encounter (Signed)
PT Gregory Rodgers called stating that due to the pts fall on 3/18 he would like to continue physical therapy to help improve balance and gait training twice a week for 3 weeks. Please call at [phone]

## 2017-07-08 NOTE — Telephone Encounter (Signed)
Rn call Laveda Abbe to give orders per Dr. Leonie Man for balancing, gait ,and training twice a week for 3 weeks. Laveda Abbe verbalized understanding.

## 2017-07-08 NOTE — Telephone Encounter (Signed)
Left vm for Gregory Rodgers PT with [facility].

## 2017-07-10 ENCOUNTER — Telehealth: Payer: Self-pay | Admitting: Cardiology

## 2017-07-10 ENCOUNTER — Ambulatory Visit (INDEPENDENT_AMBULATORY_CARE_PROVIDER_SITE_OTHER): Payer: PPO | Admitting: *Deleted

## 2017-07-10 DIAGNOSIS — I255 Ischemic cardiomyopathy: Secondary | ICD-10-CM

## 2017-07-10 NOTE — Telephone Encounter (Signed)
Confirmed remote transmission w/ pt wife.   

## 2017-07-10 NOTE — Progress Notes (Signed)
Remote ICD transmission.   

## 2017-07-12 ENCOUNTER — Encounter: Payer: Self-pay | Admitting: Cardiology

## 2017-07-18 ENCOUNTER — Telehealth: Payer: Self-pay | Admitting: Cardiology

## 2017-07-18 NOTE — Telephone Encounter (Signed)
Spoke with pts daughter informed that I had reviewed the transmission and pt had not had any episodes and that the lead measurements were within normal limits, pts daughter appreciative of call back.

## 2017-07-18 NOTE — Telephone Encounter (Signed)
Patient daughter called and stated that pt had an episode where he became very dizzy, and hot and clamy. Blood pressure was 168/64 (right arm) and fifteen laters the blood pressure 150/62 (left arm). Instructed pt daughter to send a manual transmission w/ pt home monitor. Informed pt daughter that once the transmission is received a Chief Operating Officer will review transmission and call her back. Pt daughter verbalized understanding.

## 2017-07-20 LAB — CUP PACEART REMOTE DEVICE CHECK
Battery Voltage: 3.05 V
Brady Statistic AP VP Percent: 7.9 %
Brady Statistic AP VS Percent: 1 %
Brady Statistic AS VP Percent: 91 %
Brady Statistic RA Percent Paced: 7.1 %
Date Time Interrogation Session: 20190327181753
HighPow Impedance: 66 Ohm
HighPow Impedance: 66 Ohm
Implantable Lead Location: 753858
Implantable Lead Location: 753860
Implantable Pulse Generator Implant Date: 20180926
Lead Channel Impedance Value: 440 Ohm
Lead Channel Impedance Value: 750 Ohm
Lead Channel Pacing Threshold Amplitude: 0.5 V
Lead Channel Pacing Threshold Amplitude: 1.25 V
Lead Channel Pacing Threshold Pulse Width: 0.5 ms
Lead Channel Pacing Threshold Pulse Width: 0.5 ms
Lead Channel Pacing Threshold Pulse Width: 0.8 ms
Lead Channel Sensing Intrinsic Amplitude: 11.7 mV
Lead Channel Sensing Intrinsic Amplitude: 2.7 mV
Lead Channel Setting Pacing Amplitude: 2 V
Lead Channel Setting Pacing Pulse Width: 0.5 ms
Lead Channel Setting Sensing Sensitivity: 0.5 mV
MDC IDC LEAD IMPLANT DT: 20180926
MDC IDC LEAD IMPLANT DT: 20180926
MDC IDC LEAD IMPLANT DT: 20180926
MDC IDC LEAD LOCATION: 753859
MDC IDC MSMT BATTERY REMAINING LONGEVITY: 75 mo
MDC IDC MSMT BATTERY REMAINING PERCENTAGE: 88 %
MDC IDC MSMT LEADCHNL RA PACING THRESHOLD AMPLITUDE: 0.75 V
MDC IDC MSMT LEADCHNL RV IMPEDANCE VALUE: 480 Ohm
MDC IDC SET LEADCHNL LV PACING AMPLITUDE: 2.5 V
MDC IDC SET LEADCHNL LV PACING PULSEWIDTH: 0.8 ms
MDC IDC SET LEADCHNL RV PACING AMPLITUDE: 2.5 V
MDC IDC STAT BRADY AS VS PERCENT: 1 %
Pulse Gen Serial Number: 9764296

## 2017-07-23 ENCOUNTER — Ambulatory Visit (INDEPENDENT_AMBULATORY_CARE_PROVIDER_SITE_OTHER): Payer: PPO | Admitting: Adult Health

## 2017-07-23 ENCOUNTER — Encounter: Payer: Self-pay | Admitting: Adult Health

## 2017-07-23 ENCOUNTER — Telehealth: Payer: Self-pay

## 2017-07-23 VITALS — BP 151/64 | HR 59 | Ht 70.0 in | Wt 177.4 lb

## 2017-07-23 DIAGNOSIS — E785 Hyperlipidemia, unspecified: Secondary | ICD-10-CM

## 2017-07-23 DIAGNOSIS — I63312 Cerebral infarction due to thrombosis of left middle cerebral artery: Secondary | ICD-10-CM

## 2017-07-23 DIAGNOSIS — I10 Essential (primary) hypertension: Secondary | ICD-10-CM | POA: Diagnosis not present

## 2017-07-23 NOTE — Progress Notes (Signed)
[name] [address] [place]. [place] [address] [phone]       OFFICE FOLLOW UP NOTE  [name] Date of Birth:  02/21/1928 Medical Record Number:  [id number]   Reason for Referral:  hospital stroke follow up  CHIEF COMPLAINT:  Chief Complaint  Patient presents with  . Follow-up    Stroke follow up from hospital, Pt was seen by Dr. Leonie Man in hospital     HPI: Gregory Rodgers is being seen today for initial visit in the office for left MCA stroke on 06/01/17. History obtained from patient and chart review. Reviewed all radiology images and labs personally.  [name] is a 84 y.o. male with history of coronary artery disease with previous MI, left bundle branch block, borderline diabetes, hyperlipidemia, history of renal calculi, hypertension, chronic lymphocytic leukemia, CHF, cardiomyopathy with symptomatic nonsustained V. tach, and status post placement of a biventricular pacemaker/AICD presenting with transient speech problems, confusion, transient visual loss, and right lower extremity weakness. He did not receive IV t-PA due to late presentation.  MRI brain reviewed and showed multiple left MCA territory infarcts.  MRA head/neck showed nonocclusive filling defect in the left carotid bulb compatible with thrombus.  2D echo showed EF of 40-55% but no cardiac source of emboli was identified.  TEE did show a large PFO. LDL 81 and A1c 6.0.  Patient was previously on aspirin 81 mg PTA and was recommended to take aspirin 81 mg and Plavix 75 mg daily..  Recommended for patient to start Lipitor 40 mg daily.  Due to left ICA thrombus, he was started on IV heparin in the hospital but switch to aspirin and Plavix at discharge by Dr. Estanislado Pandy.  Patient discharged home with PT and speech therapy.  Since discharge, patient has been doing well and is accompanied today's visit by wife and daughter.  He continues to do physical therapy where he does have 1 more  session but will continue speech therapy for 2 more weeks.  He did fall recently and fell on right arm and was seen at an urgent care.   he was complaining of right shoulder pain/right arm pain but this has been improving over time.  Blood pressure slightly elevated at 151/60 but they do check blood pressure at home and typically runs SBP 130-140.  Wife is requesting orthotic  for leg length discrepancy as recommended by PT.  Patient's PCP recently closed down in the under process of finding a new PCP.  No further complaints at this time.  No new or worsening stroke/TIA symptoms.   ROS:   14 system review of systems performed and negative with exception of hearing loss, snoring, frequency of urination, bruise/bleed easily, and dizziness  PMH:  Past Medical History:  Diagnosis Date  . Basal cell carcinoma    "numerous places"  . Borderline diabetes   . Cardiomyopathy, dilated ([place]) 03/2016   Unclear etiology  . CLL (chronic lymphocytic leukemia) ([place]) 09/2009   stage 0; on observation  . Coronary artery disease involving native heart without angina pectoris    PTCA of RCA in setting of an MI in 1986-87; no follow-up study since available.  . Essential hypertension   . History of kidney stones   . Hyperlipidemia with target LDL less than 70    With known CAD  . Left bundle branch block (LBBB) on electrocardiogram    Noted as far back as 2008.  . Old inferior wall myocardial infarction  1601/09   Had PTCA most likely of the RCA  . Stroke [facility])     PSH:  Past Surgical History:  Procedure Laterality Date  . BASAL CELL CARCINOMA EXCISION     "numerous"  . BIV ICD INSERTION CRT-D N/A 01/09/2017   Procedure: BIV ICD INSERTION CRT-D;  Surgeon: Evans Lance, MD;  Location: [place] CV LAB;  Service: Cardiovascular;  Laterality: N/A;  . CATARACT EXTRACTION W/ INTRAOCULAR LENS  IMPLANT, BILATERAL Bilateral   . CORONARY ANGIOPLASTY  1986/1987  . defibllator    . dual pacemaker    . EP  IMPLANTABLE DEVICE N/A 05/10/2016   Procedure: Loop Recorder Insertion;  Surgeon: Evans Lance, MD;  Location: [place] CV LAB;  Service: Cardiovascular;  Laterality: N/A;  slight blood drop on dressing  . JOINT REPLACEMENT    . MOHS SURGERY Left 01/02/2017   ear  . REPLACEMENT TOTAL KNEE Left   . TEE WITHOUT CARDIOVERSION N/A 06/03/2017   Procedure: TRANSESOPHAGEAL ECHOCARDIOGRAM (TEE);  Surgeon: Larey Dresser, MD;  Location: [facility] ENDOSCOPY;  Service: Cardiovascular;  Laterality: N/A;  . TRANSTHORACIC ECHOCARDIOGRAM  03/2016   Normal LV size with mild LV hypertrophy. EF 30%. Diffuse  hypokinesis wtih septal-lateral dyssynchrony. Normal RV size and systolic function. Mild MR and mild AI.    Social History:  Social History   Socioeconomic History  . Marital status: Married    Spouse name: Not on file  . Number of children: Not on file  . Years of education: Not on file  . Highest education level: Not on file  Occupational History  . Not on file  Social Needs  . Financial resource strain: Not on file  . Food insecurity:    Worry: Not on file    Inability: Not on file  . Transportation needs:    Medical: Not on file    Non-medical: Not on file  Tobacco Use  . Smoking status: Never Smoker  . Smokeless tobacco: Never Used  Substance and Sexual Activity  . Alcohol use: No  . Drug use: No  . Sexual activity: Not on file  Lifestyle  . Physical activity:    Days per week: Not on file    Minutes per session: Not on file  . Stress: Not on file  Relationships  . Social connections:    Talks on phone: Not on file    Gets together: Not on file    Attends religious service: Not on file    Active member of club or organization: Not on file    Attends meetings of clubs or organizations: Not on file    Relationship status: Not on file  . Intimate partner violence:    Fear of current or ex partner: Not on file    Emotionally abused: Not on file    Physically abused: Not on file     Forced sexual activity: Not on file  Other Topics Concern  . Not on file  Social History Narrative  . Not on file    Family History:  Family History  Problem Relation Age of Onset  . Sudden Cardiac Death Neg Hx     Medications:   Current Outpatient Medications on File Prior to Visit  Medication Sig Dispense Refill  . aspirin 81 MG tablet Take 81 mg by mouth daily.    Marland Kitchen atorvastatin (LIPITOR) 40 MG tablet Take 1 tablet (40 mg total) by mouth at bedtime. 30 tablet 3  . Calcium Carbonate-Simethicone (MAALOX MAX  PO) Take 5 mLs by mouth daily as needed (for an upset stomach).     . clopidogrel (PLAVIX) 75 MG tablet Take 1 tablet (75 mg total) by mouth daily. 30 tablet 3  . co-enzyme Q-10 30 MG capsule Take 30 mg by mouth daily.    Marland Kitchen lisinopril (PRINIVIL,ZESTRIL) 10 MG tablet Take 1 tablet (10 mg total) by mouth daily. PLEASE CONTACT OFFICE FOR ADDITIONAL REFILLS (Patient taking differently: Take 10 mg by mouth daily. ) 90 tablet 0  . MEGARED OMEGA-3 KRILL OIL 500 MG CAPS Take 1 capsule by mouth daily.    . metoprolol succinate (TOPROL XL) 25 MG 24 hr tablet Take 1 tablet (25 mg total) by mouth daily. 30 tablet 6  . Multiple Vitamin (MULTIVITAMIN) tablet Take 1 tablet by mouth daily.    . Probiotic CAPS Take 1 capsule by mouth every other day.      No current facility-administered medications on file prior to visit.     Allergies:   Allergies  Allergen Reactions  . Tape Other (See Comments)    "PLASTIC" TAPE PULLS OFF THE SKIN AND THE REMOVAL OF ALL TAPES BRUISE HIM!!!     Physical Exam  Vitals:   07/23/17 1113  BP: (!) 151/64  Pulse: (!) 59  Weight: 177 lb 6.4 oz (80.5 kg)  Height: 5\' 10"  (1.778 m)   Body mass index is 25.45 kg/m. No exam data present  General: well developed, pleasant elderly Caucasian male, well nourished, seated, in no evident distress Head: head normocephalic and atraumatic.   Neck: supple with no carotid or supraclavicular  bruits Cardiovascular: regular rate and rhythm, no murmurs Musculoskeletal: no deformity Skin:  no rash/petichiae Vascular:  Normal pulses all extremities  Neurologic Exam Mental Status: Awake and fully alert.  Mild expressive aphasia.  Oriented to place and time. Recent and remote memory intact. Attention span, concentration and fund of knowledge appropriate. Mood and affect appropriate.  Cranial Nerves: Fundoscopic exam reveals sharp disc margins. Pupils equal, briskly reactive to light. Extraocular movements full without nystagmus. Visual fields full to confrontation. Hearing intact. Facial sensation intact. Face, tongue, palate moves normally and symmetrically.  Motor: Normal bulk and tone. Normal strength in all tested extremity muscles. Sensory.: intact to touch , pinprick , position and vibratory sensation.  Coordination: Rapid alternating movements normal in all extremities. Finger-to-nose and heel-to-shin performed accurately bilaterally. Gait and Station: Arises from chair without difficulty. Stance is normal. Gait demonstrates normal stride length and balance . Able to heel, toe and tandem walk without difficulty.  Reflexes: 1+ and symmetric. Toes downgoing.    NIHSS  1 Modified Rankin  1    Diagnostic Data (Labs, Imaging, Testing)   Mr Gregory Rodgers Neck W Wo Contrast Mr Gregory Rodgers Wo Contrast 06/01/2017   IMPRESSION:  Multiple small acute infarcts in the left MCA territory.  Irregular plaque proximal left internal carotid artery with 60% diameter narrowing.  Filling defect in the left carotid bulb compatible with thrombus.  This is likely a cause of embolic infarctions in the left MCA territory.  Atrophy and chronic microvascular ischemia.  No intracranial large vessel occlusion.    Transthoracic Echocardiogram  06/01/2017 Study Conclusions - Left ventricle: The cavity size was normal. There was severe focal basal hypertrophy of the septum with otherwise mild concentric  hypertrophy. Systolic function was mildly to moderately reduced. The estimated ejection fraction was in the range of 40% to 45%. Wall motion was normal; there were no regional wall motion abnormalities. Doppler  parameters are consistent with abnormal left ventricular relaxation (grade 1 diastolic dysfunction). - Aortic valve: Transvalvular velocity was within the normal range. There was no stenosis. There was moderate regurgitation. Regurgitation pressure half-time: 348 ms. - Aorta: Ascending aortic diameter: 42 mm (S). - Ascending aorta: The ascending aorta was mildly dilated. - Mitral valve: Transvalvular velocity was within the normal range. There was no evidence for stenosis. There was mild regurgitation. - Left atrium: The atrium was mildly dilated. - Right ventricle: The cavity size was normal. Wall thickness was normal. Systolic function was normal. - Tricuspid valve: There was trivial regurgitation. Impressions: - Compared with the prior echo 93/7902, systolic function has improved. The ascending aortic aneurysm is unchanged.  Echo TEE Result date: 06/04/2017 Study Conclusions  is- Left ventricle: Normal LV size with moderate focal basal septal hypertrophy. EF 45-50%, diffuse hypokinesis. - Aortic valve: There was no stenosis. There was moderate regurgitation. - Aorta: Normal caliber but tortuous thoracic aorta with grade III plaque in the descending thoracic aorta. - Mitral valve: There was trivial regurgitation. - Left atrium: The atrium was mildly dilated. No evidence of thrombus in the atrial cavity or appendage. - Right ventricle: The cavity size was normal. Pacer wire or catheter noted in right ventricle. Systolic function was normal. - Right atrium: No evidence of thrombus in the atrial cavity or appendage. - Atrial septum: There was a large PFO, bubble study markedly positive. Impressions: - PFO is possible source of  embolus. Left MCA   ASSESSMENT: Gregory Rodgers is a 84 y.o. year old male here with stroke on 06/01/2017 secondary to left ICA thrombus. Vascular risk factors include HTN, HLD and CAD.    PLAN: -Continue aspirin 81 mg daily and clopidogrel 75 mg daily  and Lipitor for secondary stroke prevention -f/u with cardiologist in regards to continuing aspirin 81 mg -  from neurology standpoint, patient can continue Plavix after mid May ( 52-month DAPT therapy) for secondary stroke prevention will follow up with cardiologist in regards to continuing aspirin 81 mg for CAD -F/u with PCP regarding your HLD, HTN and CAD management -continue to monitor BP at home -Okay to drive from a neurology standpoint - ride with family for the first few times and then after that only drive local, avoid main roads, and drive to places you know -Maintain strict control of hypertension with blood pressure goal below 130/90, diabetes with hemoglobin A1c goal below 6.5% and cholesterol with LDL cholesterol (bad cholesterol) goal below 70 mg/dL. I also advised the patient to eat a healthy diet with plenty of whole grains, cereals, fruits and vegetables, exercise regularly and maintain ideal body weight.  Follow up in 4 months or call earlier if needed In left does not do anything for you just Greater than 50% time during this 25 minute consultation visit was spent on counseling and coordination of care about HLD, and HTN, discussion about risk benefit of anticoagulation and answering questions.    Venancio Poisson, AGNP-BC  [facility] [address] [place], [place] [address]  Phone [phone] Fax [phone]

## 2017-07-23 NOTE — Patient Instructions (Signed)
Continue aspirin 81 mg daily and clopidogrel 75 mg daily  and lipitor  for secondary stroke prevention  Follow up with cardiologist regarding need for aspirin 81mg  after Mid May - please stay on plavix from neurology perspective  Continue to check his blood pressure at home  Okay to drive from a neurology standpoint - ride with family for the first few times and then after that only drive local, avoid main roads, and drive to places you know  Maintain strict control of hypertension with blood pressure goal below 130/90, diabetes with hemoglobin A1c goal below 6.5% and cholesterol with LDL cholesterol (bad cholesterol) goal below 70 mg/dL. I also advised the patient to eat a healthy diet with plenty of whole grains, cereals, fruits and vegetables, exercise regularly and maintain ideal body weight.  Followup in the future with me in 4 months or call earlier if needed

## 2017-07-23 NOTE — Telephone Encounter (Signed)
Patient is being seen today for hospital stroke appt for the first time.PT was seen by Dr.Sethi in the hospital. The wife stated patients therapist recommend a biotech for the patients leg. Janett Billow NP we call [facility] patient therapy services to see what kind of brace or DME equipment.  RN tried [facility] and he was not a patient there.  Rn call [facility] outpatient therapy at [phone]. Arbie Cookey stated pt was not seen by a therapist. He was only seen by  Fritz Pickerel the DME staff for a leg length descrepancy. Fritz Pickerel number is [phone].  Per Janett Billow NP pt is getting therapy at home. The wife will have the therapist call our office to determine what kind of brace pt needs. The wife and daughter are aware and will have the therapist notify us.

## 2017-07-24 ENCOUNTER — Telehealth: Payer: Self-pay | Admitting: Internal Medicine

## 2017-07-24 NOTE — Telephone Encounter (Signed)
New message    Pt c/o medication issue:  1. Name of Medication: aspirin and plavix  2. How are you currently taking this medication (dosage and times per day)? As prescribed    3. Are you having a reaction (difficulty breathing--STAT)?  no  4. What is your medication issue?  When is the patient suppose to stop the aspirin ?

## 2017-07-26 ENCOUNTER — Other Ambulatory Visit: Payer: Self-pay

## 2017-07-26 DIAGNOSIS — I639 Cerebral infarction, unspecified: Secondary | ICD-10-CM

## 2017-07-26 NOTE — Telephone Encounter (Signed)
RN receive call from [facility] with home care who is the therapist. He was at patients home and is evaluating him. He recommends a right AFO brace with a a shoe build up. DME order will be put in.

## 2017-07-26 NOTE — Telephone Encounter (Signed)
DME order sign by Janett Billow NP and fax to [phone]. The number was provided by family at last office visit.

## 2017-08-01 ENCOUNTER — Telehealth: Payer: Self-pay

## 2017-08-01 NOTE — Telephone Encounter (Signed)
Call returned to Pt, spoke with wife per DPR.  Notified per Dr. Lovena Le -ok to stop ASA- Wife indicates understanding.

## 2017-08-02 NOTE — Progress Notes (Signed)
I agree with the above plan 

## 2017-08-08 ENCOUNTER — Other Ambulatory Visit: Payer: Self-pay | Admitting: Physician Assistant

## 2017-08-08 ENCOUNTER — Telehealth: Payer: Self-pay

## 2017-08-08 NOTE — Telephone Encounter (Signed)
Rn fax home health orders to [phone]. Rn fax x3 and it was confirmed three times.

## 2017-08-09 NOTE — Telephone Encounter (Signed)
Office notes fax to [facility] [place] at [phone]. Medical necessity needed for DME equipment. Office notes fax twice and confirmed to [phone].

## 2017-08-20 DIAGNOSIS — D649 Anemia, unspecified: Secondary | ICD-10-CM | POA: Diagnosis not present

## 2017-08-20 DIAGNOSIS — R0602 Shortness of breath: Secondary | ICD-10-CM | POA: Diagnosis not present

## 2017-08-20 DIAGNOSIS — R531 Weakness: Secondary | ICD-10-CM | POA: Diagnosis not present

## 2017-08-20 DIAGNOSIS — Z6825 Body mass index (BMI) 25.0-25.9, adult: Secondary | ICD-10-CM | POA: Diagnosis not present

## 2017-08-20 DIAGNOSIS — I700 Atherosclerosis of aorta: Secondary | ICD-10-CM | POA: Diagnosis not present

## 2017-08-20 DIAGNOSIS — R05 Cough: Secondary | ICD-10-CM | POA: Diagnosis not present

## 2017-08-21 DIAGNOSIS — M21371 Foot drop, right foot: Secondary | ICD-10-CM | POA: Diagnosis not present

## 2017-08-26 DIAGNOSIS — R748 Abnormal levels of other serum enzymes: Secondary | ICD-10-CM | POA: Diagnosis not present

## 2017-08-26 DIAGNOSIS — I700 Atherosclerosis of aorta: Secondary | ICD-10-CM | POA: Diagnosis not present

## 2017-08-26 DIAGNOSIS — K8020 Calculus of gallbladder without cholecystitis without obstruction: Secondary | ICD-10-CM | POA: Diagnosis not present

## 2017-08-26 DIAGNOSIS — I2510 Atherosclerotic heart disease of native coronary artery without angina pectoris: Secondary | ICD-10-CM | POA: Diagnosis not present

## 2017-08-26 DIAGNOSIS — N289 Disorder of kidney and ureter, unspecified: Secondary | ICD-10-CM | POA: Diagnosis not present

## 2017-09-03 DIAGNOSIS — R7303 Prediabetes: Secondary | ICD-10-CM | POA: Diagnosis not present

## 2017-09-03 DIAGNOSIS — R748 Abnormal levels of other serum enzymes: Secondary | ICD-10-CM | POA: Diagnosis not present

## 2017-09-03 DIAGNOSIS — Z6825 Body mass index (BMI) 25.0-25.9, adult: Secondary | ICD-10-CM | POA: Diagnosis not present

## 2017-09-03 DIAGNOSIS — M1990 Unspecified osteoarthritis, unspecified site: Secondary | ICD-10-CM | POA: Diagnosis not present

## 2017-09-03 DIAGNOSIS — C9190 Lymphoid leukemia, unspecified not having achieved remission: Secondary | ICD-10-CM | POA: Diagnosis not present

## 2017-09-03 DIAGNOSIS — E559 Vitamin D deficiency, unspecified: Secondary | ICD-10-CM | POA: Diagnosis not present

## 2017-09-10 DIAGNOSIS — R7989 Other specified abnormal findings of blood chemistry: Secondary | ICD-10-CM | POA: Diagnosis not present

## 2017-09-10 DIAGNOSIS — R748 Abnormal levels of other serum enzymes: Secondary | ICD-10-CM | POA: Diagnosis not present

## 2017-09-11 ENCOUNTER — Other Ambulatory Visit: Payer: Self-pay | Admitting: Cardiology

## 2017-09-11 DIAGNOSIS — C9110 Chronic lymphocytic leukemia of B-cell type not having achieved remission: Secondary | ICD-10-CM

## 2017-09-12 NOTE — Telephone Encounter (Signed)
Rx(s) sent to pharmacy electronically.  

## 2017-09-17 ENCOUNTER — Telehealth: Payer: Self-pay | Admitting: Hematology and Oncology

## 2017-09-17 NOTE — Telephone Encounter (Signed)
Patients daughter called to r/s upcoming June appointment due to vacation .

## 2017-09-18 ENCOUNTER — Encounter: Payer: Self-pay | Admitting: Sports Medicine

## 2017-09-18 ENCOUNTER — Ambulatory Visit (INDEPENDENT_AMBULATORY_CARE_PROVIDER_SITE_OTHER): Payer: PPO | Admitting: Sports Medicine

## 2017-09-18 DIAGNOSIS — M79674 Pain in right toe(s): Secondary | ICD-10-CM | POA: Diagnosis not present

## 2017-09-18 DIAGNOSIS — M79675 Pain in left toe(s): Secondary | ICD-10-CM

## 2017-09-18 DIAGNOSIS — M21371 Foot drop, right foot: Secondary | ICD-10-CM

## 2017-09-18 DIAGNOSIS — I739 Peripheral vascular disease, unspecified: Secondary | ICD-10-CM | POA: Diagnosis not present

## 2017-09-18 DIAGNOSIS — B351 Tinea unguium: Secondary | ICD-10-CM | POA: Diagnosis not present

## 2017-09-18 NOTE — Progress Notes (Signed)
Subjective: Gregory Rodgers is a 84 y.o. male patient seen today in office with complaint of mildly painful thickened and elongated toenails; unable to trim. Tried to trim nails and cut self 3 days ago. Patient denies history of Diabetes. Admits to history of stroke with foot drop and Vascular disease. Uses a brace on right foot foot drop. Patient has no other pedal complaints at this time.   Review of Systems  Musculoskeletal: Positive for myalgias.  Neurological: Positive for weakness.  All other systems reviewed and are negative.    Patient Active Problem List   Diagnosis Date Noted  . Transient speech disturbance 06/01/2017  . 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 Cerebral embolism with cerebral infarction 06/01/2017  . Polymorphic ventricular tachycardia ([place]) 01/09/2017  . NSVT (nonsustained ventricular tachycardia) ([place]) 05/01/2016  . Left bundle branch block (LBBB) on electrocardiogram 02/27/2016  . Dizziness of unknown cause 02/27/2016  . Bradycardia with 41-50 beats per minute 02/27/2016  . Ischemic cardiomyopathy: EF 40-45%  now 30% 02/27/2016  . Pancytopenia, acquired ([place]) 09/30/2015  . Thrombocytopenia ([place]) 10/01/2014  . 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 CLL (chronic lymphocytic leukemia) ([place]) 06/20/2011  . Hyperlipidemia 06/20/2011  . Essential hypertension 06/20/2011  . Borderline diabetes 06/20/2011  . CAD S/P percutaneous coronary angioplasty 02/26/1985    Current Outpatient Medications on File Prior to Visit  Medication Sig Dispense Refill  . clopidogrel (PLAVIX) 75 MG tablet Take 1 tablet (75 mg total) by mouth daily. 30 tablet 3  . co-enzyme Q-10 30 MG capsule Take 30 mg by mouth daily.    Marland Kitchen lisinopril (PRINIVIL,ZESTRIL) 10 MG tablet Take 1 tablet (10 mg total) by mouth daily. PLEASE CONTACT OFFICE FOR ADDITIONAL REFILLS 15 tablet 0  . MEGARED OMEGA-3 KRILL OIL 500 MG CAPS Take 1 capsule by mouth daily.    . metoprolol succinate  (TOPROL-XL) 25 MG 24 hr tablet TAKE ONE TABLET BY MOUTH ONCE DAILY 30 tablet 9  . Multiple Vitamin (MULTIVITAMIN) tablet Take 1 tablet by mouth daily.    . Probiotic CAPS Take 1 capsule by mouth every other day.      No current facility-administered medications on file prior to visit.     Allergies  Allergen Reactions  . Tape Other (See Comments)    "PLASTIC" TAPE PULLS OFF THE SKIN AND THE REMOVAL OF ALL TAPES BRUISE HIM!!!    Objective: Physical Exam  General: Well developed, nourished, no acute distress, awake, alert and oriented x 3  Vascular: Dorsalis pedis artery 1/4 bilateral, Posterior tibial artery 1/4 bilateral, skin temperature warm to warm proximal to distal bilateral lower extremities, ++ varicosities, pedal hair present bilateral.  Neurological: Gross sensation present via light touch bilateral.   Dermatological: Skin is warm, dry, and supple bilateral, Nails 1-10 are tender, long, thick, and discolored with mild subungal debris, Dry blood to toes with no signs of infection, no webspace macerations present bilateral, no open lesions present bilateral, no callus/corns/hyperkeratotic tissue present bilateral. No signs of infection bilateral.  Musculoskeletal: + foot drop and weakness on right.  Assessment and Plan:  Problem List Items Addressed This Visit    None    Visit Diagnoses    Pain due to onychomycosis of toenails of both feet    -  Primary   PVD (peripheral vascular disease) ([place])       Foot drop, right         -Examined patient.  -Discussed treatment options for painful mycotic nails. -Mechanically debrided  and reduced mycotic nails with sterile nail nipper and dremel nail file without incident. -Advised patient against trimming nails himself  -Continue with AFO on right -Patient to return in 3 months for follow up evaluation or sooner if symptoms worsen.  Landis Martins, DPM

## 2017-09-20 ENCOUNTER — Other Ambulatory Visit: Payer: Self-pay | Admitting: Hematology and Oncology

## 2017-09-20 DIAGNOSIS — C9110 Chronic lymphocytic leukemia of B-cell type not having achieved remission: Secondary | ICD-10-CM

## 2017-09-23 ENCOUNTER — Inpatient Hospital Stay: Payer: PPO | Attending: Hematology and Oncology

## 2017-09-23 ENCOUNTER — Inpatient Hospital Stay (HOSPITAL_BASED_OUTPATIENT_CLINIC_OR_DEPARTMENT_OTHER): Payer: PPO | Admitting: Hematology and Oncology

## 2017-09-23 ENCOUNTER — Telehealth: Payer: Self-pay | Admitting: Hematology and Oncology

## 2017-09-23 ENCOUNTER — Encounter: Payer: Self-pay | Admitting: Hematology and Oncology

## 2017-09-23 DIAGNOSIS — Z85828 Personal history of other malignant neoplasm of skin: Secondary | ICD-10-CM | POA: Diagnosis not present

## 2017-09-23 DIAGNOSIS — D630 Anemia in neoplastic disease: Secondary | ICD-10-CM | POA: Diagnosis not present

## 2017-09-23 DIAGNOSIS — C9110 Chronic lymphocytic leukemia of B-cell type not having achieved remission: Secondary | ICD-10-CM | POA: Diagnosis not present

## 2017-09-23 LAB — CBC WITH DIFFERENTIAL/PLATELET
Basophils Absolute: 0 10*3/uL (ref 0.0–0.1)
Basophils Relative: 0 %
Eosinophils Absolute: 0.1 10*3/uL (ref 0.0–0.5)
Eosinophils Relative: 0 %
HEMATOCRIT: 33.8 % — AB (ref 38.4–49.9)
HEMOGLOBIN: 10.8 g/dL — AB (ref 13.0–17.1)
LYMPHS ABS: 34.8 10*3/uL — AB (ref 0.9–3.3)
LYMPHS PCT: 91 %
MCH: 33.4 pg (ref 27.2–33.4)
MCHC: 32 g/dL (ref 32.0–36.0)
MCV: 104.6 fL — ABNORMAL HIGH (ref 79.3–98.0)
MONOS PCT: 3 %
Monocytes Absolute: 1 10*3/uL — ABNORMAL HIGH (ref 0.1–0.9)
NEUTROS ABS: 2.5 10*3/uL (ref 1.5–6.5)
NEUTROS PCT: 6 %
Platelets: 140 10*3/uL (ref 140–400)
RBC: 3.23 MIL/uL — ABNORMAL LOW (ref 4.20–5.82)
RDW: 13.6 % (ref 11.0–14.6)
WBC: 38.4 10*3/uL — ABNORMAL HIGH (ref 4.0–10.3)

## 2017-09-23 NOTE — Assessment & Plan Note (Signed)
The patient remained asymptomatic.  Blood work is stable with no significant signs of disease progression I recommend a yearly visit from now on with history, physical examination and blood work.

## 2017-09-23 NOTE — Assessment & Plan Note (Signed)
He has history of skin cancer He likes to workout in the yard a lot Examination of his skin did not reveal any suspicious skin lesions today We discussed the importance of avoiding excessive sun exposure and vigilant skin exam by dermatologist due to high risk of skin cancer in association with a diagnosis of CLL

## 2017-09-23 NOTE — Assessment & Plan Note (Signed)
He has multifactorial anemia, likely anemia chronic disease and related to CLL I will order iron studies in his next visit His recent serum vitamin B12 and folate level were within normal limits

## 2017-09-23 NOTE — Telephone Encounter (Signed)
Gave patient avs and calendar of upcoming June appointments.  °

## 2017-09-23 NOTE — Progress Notes (Signed)
[facility] OFFICE PROGRESS NOTE  Patient Care Team: Orlinda Blalock, NP as PCP - General (Nurse Practitioner) Heath Lark, MD as Consulting Physician (Hematology and Oncology)  ASSESSMENT & PLAN:  CLL (chronic lymphocytic leukemia) [facility]) The patient remained asymptomatic.  Blood work is stable with no significant signs of disease progression I recommend a yearly visit from now on with history, physical examination and blood work.  Anemia in neoplastic disease He has multifactorial anemia, likely anemia chronic disease and related to CLL I will order iron studies in his next visit His recent serum vitamin B12 and folate level were within normal limits  History of skin cancer He has history of skin cancer He likes to workout in the yard a lot Examination of his skin did not reveal any suspicious skin lesions today We discussed the importance of avoiding excessive sun exposure and vigilant skin exam by dermatologist due to high risk of skin cancer in association with a diagnosis of CLL   Orders Placed This Encounter  Procedures  . CBC with Differential/Platelet    Standing Status:   Future    Standing Expiration Date:   10/28/2018  . Iron and TIBC    Standing Status:   Future    Standing Expiration Date:   10/28/2018  . Ferritin    Standing Status:   Future    Standing Expiration Date:   10/28/2018    INTERVAL HISTORY: Please see below for problem oriented charting. He returns with his wife and daughter for further follow-up The patient had a diagnosis of stroke recently but has near full recovery except for some word finding difficulties He denies recent falls No new lymphadenopathy Denies recent atypical infection such as severe pneumonia or shingles His appetite is stable without recent weight change  SUMMARY OF ONCOLOGIC HISTORY:   CLL (chronic lymphocytic leukemia) ([place])   09/26/2009 Pathology Results    Flow cytometry confirmed diagnosis of CLL. LEUKEMIC  CELLS ARE NEGATIVE FOR CD38 AND ZAP70 EXPRESSION       REVIEW OF SYSTEMS:   Constitutional: Denies fevers, chills or abnormal weight loss Eyes: Denies blurriness of vision Ears, nose, mouth, throat, and face: Denies mucositis or sore throat Respiratory: Denies cough, dyspnea or wheezes Cardiovascular: Denies palpitation, chest discomfort or lower extremity swelling Gastrointestinal:  Denies nausea, heartburn or change in bowel habits Skin: Denies abnormal skin rashes Lymphatics: Denies new lymphadenopathy Neurological:Denies numbness, tingling or new weaknesses Behavioral/Psych: Mood is stable, no new changes  All other systems were reviewed with the patient and are negative.  I have reviewed the past medical history, past surgical history, social history and family history with the patient and they are unchanged from previous note.  ALLERGIES:  is allergic to tape.  MEDICATIONS:  Current Outpatient Medications  Medication Sig Dispense Refill  . clopidogrel (PLAVIX) 75 MG tablet Take 1 tablet (75 mg total) by mouth daily. 30 tablet 3  . co-enzyme Q-10 30 MG capsule Take 30 mg by mouth daily.    Marland Kitchen lisinopril (PRINIVIL,ZESTRIL) 10 MG tablet Take 1 tablet (10 mg total) by mouth daily. PLEASE CONTACT OFFICE FOR ADDITIONAL REFILLS 15 tablet 0  . MEGARED OMEGA-3 KRILL OIL 500 MG CAPS Take 1 capsule by mouth daily.    . metoprolol succinate (TOPROL-XL) 25 MG 24 hr tablet TAKE ONE TABLET BY MOUTH ONCE DAILY 30 tablet 9  . Multiple Vitamin (MULTIVITAMIN) tablet Take 1 tablet by mouth daily.    . Probiotic CAPS Take 1 capsule by mouth  every other day.      No current facility-administered medications for this visit.     PHYSICAL EXAMINATION: ECOG PERFORMANCE STATUS: 1 - Symptomatic but completely ambulatory  Vitals:   09/23/17 1247  BP: (!) 153/60  Pulse: (!) 50  Resp: 18  Temp: 97.7 F (36.5 C)  SpO2: 100%   Filed Weights   09/23/17 1247  Weight: 173 lb 12.8 oz (78.8 kg)     GENERAL:alert, no distress and comfortable SKIN: skin color, texture, turgor are normal, no rashes or significant lesions.  He has chronic skin bruising EYES: normal, Conjunctiva are pink and non-injected, sclera clear OROPHARYNX:no exudate, no erythema and lips, buccal mucosa, and tongue normal  NECK: supple, thyroid normal size, non-tender, without nodularity LYMPH:  no palpable lymphadenopathy in the cervical, axillary or inguinal LUNGS: clear to auscultation and percussion with normal breathing effort HEART: regular rate & rhythm and no murmurs and no lower extremity edema ABDOMEN:abdomen soft, non-tender and normal bowel sounds Musculoskeletal:no cyanosis of digits and no clubbing  NEURO: alert & oriented x 3 with fluent speech, no focal motor/sensory deficits  LABORATORY DATA:  I have reviewed the data as listed    Component Value Date/Time   NA 140 06/03/2017 0232   NA 141 09/30/2015 0808   K 4.5 06/03/2017 0232   K 4.5 09/30/2015 0808   CL 109 06/03/2017 0232   CL 108 (H) 08/25/2012 1040   CO2 23 06/03/2017 0232   CO2 25 09/30/2015 0808   GLUCOSE 104 (H) 06/03/2017 0232   GLUCOSE 89 09/30/2015 0808   GLUCOSE 91 08/25/2012 1040   BUN 18 06/03/2017 0232   BUN 23.3 09/30/2015 0808   CREATININE 1.02 06/03/2017 0232   CREATININE 1.0 09/30/2015 0808   CALCIUM 8.4 (L) 06/03/2017 0232   CALCIUM 8.7 09/30/2015 0808   PROT 4.9 (L) 06/01/2017 0452   PROT 5.8 (L) 09/30/2015 0808   ALBUMIN 3.2 (L) 06/01/2017 0452   ALBUMIN 3.5 09/30/2015 0808   AST 18 06/01/2017 0452   AST 16 09/30/2015 0808   ALT 12 (L) 06/01/2017 0452   ALT 13 09/30/2015 0808   ALKPHOS 59 06/01/2017 0452   ALKPHOS 65 09/30/2015 0808   BILITOT 0.7 06/01/2017 0452   BILITOT 0.51 09/30/2015 0808   GFRNONAA >60 06/03/2017 0232   GFRAA >60 06/03/2017 0232    No results found for: SPEP, UPEP  Lab Results  Component Value Date   WBC 38.4 (H) 09/23/2017   NEUTROABS 2.5 09/23/2017   HGB 10.8 (L)  09/23/2017   HCT 33.8 (L) 09/23/2017   MCV 104.6 (H) 09/23/2017   PLT 140 09/23/2017      Chemistry      Component Value Date/Time   NA 140 06/03/2017 0232   NA 141 09/30/2015 0808   K 4.5 06/03/2017 0232   K 4.5 09/30/2015 0808   CL 109 06/03/2017 0232   CL 108 (H) 08/25/2012 1040   CO2 23 06/03/2017 0232   CO2 25 09/30/2015 0808   BUN 18 06/03/2017 0232   BUN 23.3 09/30/2015 0808   CREATININE 1.02 06/03/2017 0232   CREATININE 1.0 09/30/2015 0808      Component Value Date/Time   CALCIUM 8.4 (L) 06/03/2017 0232   CALCIUM 8.7 09/30/2015 0808   ALKPHOS 59 06/01/2017 0452   ALKPHOS 65 09/30/2015 0808   AST 18 06/01/2017 0452   AST 16 09/30/2015 0808   ALT 12 (L) 06/01/2017 0452   ALT 13 09/30/2015 0808   BILITOT 0.7  06/01/2017 0452   BILITOT 0.51 09/30/2015 0808       All questions were answered. The patient knows to call the clinic with any problems, questions or concerns. No barriers to learning was detected.  I spent 15 minutes counseling the patient face to face. The total time spent in the appointment was 20 minutes and more than 50% was on counseling and review of test results  Heath Lark, MD 09/23/2017 2:06 PM

## 2017-10-07 ENCOUNTER — Ambulatory Visit: Payer: PPO | Admitting: Hematology and Oncology

## 2017-10-07 ENCOUNTER — Other Ambulatory Visit: Payer: PPO

## 2017-10-09 ENCOUNTER — Telehealth: Payer: Self-pay

## 2017-10-09 ENCOUNTER — Ambulatory Visit (INDEPENDENT_AMBULATORY_CARE_PROVIDER_SITE_OTHER): Payer: PPO | Admitting: *Deleted

## 2017-10-09 DIAGNOSIS — I255 Ischemic cardiomyopathy: Secondary | ICD-10-CM | POA: Diagnosis not present

## 2017-10-09 DIAGNOSIS — Z8673 Personal history of transient ischemic attack (TIA), and cerebral infarction without residual deficits: Secondary | ICD-10-CM | POA: Diagnosis not present

## 2017-10-09 DIAGNOSIS — E7800 Pure hypercholesterolemia, unspecified: Secondary | ICD-10-CM | POA: Diagnosis not present

## 2017-10-09 DIAGNOSIS — Z79899 Other long term (current) drug therapy: Secondary | ICD-10-CM | POA: Diagnosis not present

## 2017-10-09 DIAGNOSIS — Z1339 Encounter for screening examination for other mental health and behavioral disorders: Secondary | ICD-10-CM | POA: Diagnosis not present

## 2017-10-09 NOTE — Telephone Encounter (Signed)
LMOVM reminding pt to send remote transmission.   

## 2017-10-10 ENCOUNTER — Encounter: Payer: Self-pay | Admitting: Cardiology

## 2017-10-10 NOTE — Progress Notes (Signed)
Remote ICD transmission.   

## 2017-10-13 LAB — CUP PACEART REMOTE DEVICE CHECK
Battery Remaining Longevity: 70 mo
Battery Remaining Percentage: 84 %
Brady Statistic AP VP Percent: 15 %
Brady Statistic AS VP Percent: 82 %
Brady Statistic AS VS Percent: 1.2 %
HIGH POWER IMPEDANCE MEASURED VALUE: 68 Ohm
HighPow Impedance: 68 Ohm
Implantable Lead Implant Date: 20180926
Implantable Lead Implant Date: 20180926
Implantable Lead Location: 753859
Implantable Lead Location: 753860
Lead Channel Impedance Value: 410 Ohm
Lead Channel Impedance Value: 730 Ohm
Lead Channel Pacing Threshold Amplitude: 0.5 V
Lead Channel Pacing Threshold Amplitude: 0.75 V
Lead Channel Pacing Threshold Amplitude: 1.25 V
Lead Channel Pacing Threshold Pulse Width: 0.8 ms
Lead Channel Sensing Intrinsic Amplitude: 2.6 mV
Lead Channel Setting Pacing Amplitude: 2 V
Lead Channel Setting Pacing Pulse Width: 0.8 ms
Lead Channel Setting Sensing Sensitivity: 0.5 mV
MDC IDC LEAD IMPLANT DT: 20180926
MDC IDC LEAD LOCATION: 753858
MDC IDC MSMT BATTERY VOLTAGE: 3.01 V
MDC IDC MSMT LEADCHNL RA IMPEDANCE VALUE: 410 Ohm
MDC IDC MSMT LEADCHNL RA PACING THRESHOLD PULSEWIDTH: 0.5 ms
MDC IDC MSMT LEADCHNL RV PACING THRESHOLD PULSEWIDTH: 0.5 ms
MDC IDC MSMT LEADCHNL RV SENSING INTR AMPL: 11.7 mV
MDC IDC PG IMPLANT DT: 20180926
MDC IDC PG SERIAL: 9764296
MDC IDC SESS DTM: 20190626194831
MDC IDC SET LEADCHNL LV PACING AMPLITUDE: 2.5 V
MDC IDC SET LEADCHNL RV PACING AMPLITUDE: 2.5 V
MDC IDC SET LEADCHNL RV PACING PULSEWIDTH: 0.5 ms
MDC IDC STAT BRADY AP VS PERCENT: 1 %
MDC IDC STAT BRADY RA PERCENT PACED: 14 %

## 2017-10-30 ENCOUNTER — Ambulatory Visit: Payer: PPO | Admitting: Cardiology

## 2017-10-30 ENCOUNTER — Encounter: Payer: Self-pay | Admitting: Cardiology

## 2017-10-30 VITALS — BP 146/58 | HR 50 | Ht 70.0 in | Wt 174.0 lb

## 2017-10-30 DIAGNOSIS — I255 Ischemic cardiomyopathy: Secondary | ICD-10-CM

## 2017-10-30 DIAGNOSIS — I4729 Other ventricular tachycardia: Secondary | ICD-10-CM

## 2017-10-30 DIAGNOSIS — I10 Essential (primary) hypertension: Secondary | ICD-10-CM

## 2017-10-30 DIAGNOSIS — E782 Mixed hyperlipidemia: Secondary | ICD-10-CM

## 2017-10-30 DIAGNOSIS — I2510 Atherosclerotic heart disease of native coronary artery without angina pectoris: Secondary | ICD-10-CM

## 2017-10-30 DIAGNOSIS — Z9861 Coronary angioplasty status: Secondary | ICD-10-CM | POA: Diagnosis not present

## 2017-10-30 DIAGNOSIS — I472 Ventricular tachycardia: Secondary | ICD-10-CM

## 2017-10-30 DIAGNOSIS — R55 Syncope and collapse: Secondary | ICD-10-CM

## 2017-10-30 NOTE — Patient Instructions (Signed)
Medication Instructions:  Your physician recommends that you continue on your current medications as directed. Please refer to the Current Medication list given to you today.   Labwork: none  Testing/Procedures: none  Follow-Up: Your physician wants you to follow-up in: March 2020 with Dr. Ellyn Hack. You will receive a reminder letter in the mail two months in advance. If you don't receive a letter, please call our office to schedule the follow-up appointment.   Any Other Special Instructions Will Be Listed Below (If Applicable).     If you need a refill on your cardiac medications before your next appointment, please call your pharmacy.

## 2017-10-30 NOTE — Progress Notes (Signed)
Is  PCP: Gregory Blalock, NP  Clinic Note: Chief Complaint  Patient presents with  . Follow-up    Recent stroke, but no active cardiac issues  . Cardiomyopathy    Improved EF after BiVCD    HPI: Gregory Rodgers is a 84 y.o. male with a PMH below who presents today for 16 month f/u for Mild-Mod ICM. -  I last saw him in March 2018 He has chronic systolic heart failure (class II) with a left bundle branch block and is now status post by V ICD placement -followed by Dr. Lovena Rodgers. Gregory Rodgers was last seen on 04/10/2017 by Dr. Lovena Rodgers.  He had no further episodes of syncope.  Was doing well with class II symptoms of heart failure.  Recent Hospitalizations:  Feb 16-19, 2019: transferred from [place] [facility] (Sx - dysarthria, transient R eye blind) -- MRI/A brain showed multiple small acute infarcts of L MCA territory with irregular L ICA ~60% lesion & L carotid bulb thrombus (likely embolic source). No Afib on IVD interrogation. TEE cancelled after TTE & MRI/A (improved EF, unchanged TAA - but note large PFO). Started on Plavix & Atorvastatin 40 mg.  Studies Personally Reviewed - (if available, images/films reviewed: From Epic Chart or Care Everywhere)  Sept 26 2018 - BiVICD - after PMVT noted on ILR (rate 250-300 bpm) associated with near syncope.  June 03, 2017 2D echo: Diffuse hypokinesis EF 45 to 50% with moderate focal basal septal hypertrophy.  Moderate aortic insufficiency.  Large PFO with positive bubble study. -Possible source of embolus.  (Done for stroke).  Ascending aortic aneurysm unchanged.   Negative Rodgers Venous Duplex - no source for paradoxical cardioembolic event through PFO.  Interval History: Gregory Rodgers returns today for delayed f/u (for convenience, was just seeing Dr. Lovena Rodgers during the time period of ILR-BiVICD & f/u. ).  He seems to have recovered from his CVA well enough - mostly has residual word loss & dysarthria. From a Cardiac standpoint, he is  doing well --- he noted improvement in his baseline exertional dyspnea since ICD placement.  No real PND or orthopnea with stable mild Rodgers edema (L> R - but more related to L knee than true edema).    No chest pain or pressure with rest or exertion.  Rare "fluttering" sensation in the chest with syncope/near syncope. He does have some orthostatics & vertigo related dizziness -- has had at least 1 fall related to poor balance & getting up too quickly. (he forgets) No recurrent TIA/amaurosis fugax symptoms.  No claudication.  ROS: A comprehensive was performed. Review of Systems  Constitutional: Negative for malaise/fatigue (Energy getting better).  HENT: Negative for congestion and nosebleeds.   Respiratory: Negative for cough and shortness of breath.   Gastrointestinal: Negative for blood in stool and melena.  Genitourinary: Negative for hematuria.  Musculoskeletal: Positive for joint pain (L knee).  Neurological: Positive for dizziness (see HPI). Negative for focal weakness and weakness.  Endo/Heme/Allergies: Bruises/bleeds easily.  Psychiatric/Behavioral: Positive for memory loss.       Residual dysarthria & word loss from CVA, but no motor deficits.  All other systems reviewed and are negative.   I have reviewed and (if needed) personally updated the patient's problem list, medications, allergies, past medical and surgical history, social and family history.   Past Medical History:  Diagnosis Date  . Basal cell carcinoma    "numerous places"  . Borderline diabetes   . Cardiomyopathy, dilated ([place]) 03/2016  a) EF ~30% with diffuse HK (not simply ICM) ? partially due to LBBB (septal-lateral dyssynchrony). b) f/u Echo after BiV ICD = EF 45-50%  . CLL (chronic lymphocytic leukemia) ([place]) 09/2009   stage 0; on observation  . Coronary artery disease involving native heart without angina pectoris    PTCA of RCA in setting of an MI in 1986-87; no follow-up study since available.  .  Essential hypertension   . History of kidney stones   . Hyperlipidemia with target LDL less than 70    With known CAD  . 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 Left bundle branch block (LBBB) on electrocardiogram    Noted as far back as 2008.  . 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 BASAL CELL CARCINOMA EXCISION     "numerous"  . BIV ICD INSERTION CRT-D N/A 01/09/2017   Procedure: BIV ICD INSERTION CRT-D;  Surgeon: Evans Lance, MD;  Location: [facility];  Service: Cardiovascular;  Laterality: N/A;  . CATARACT EXTRACTION W/ INTRAOCULAR LENS  IMPLANT, BILATERAL Bilateral   . CORONARY ANGIOPLASTY  1986/1987  . defibllator    . dual pacemaker    . EP IMPLANTABLE DEVICE N/A 05/10/2016   Procedure: Loop Recorder Insertion;  Surgeon: Evans Lance, MD;  Location: [facility];  Service: Cardiovascular;  Laterality: N/A;  slight blood drop on dressing  . JOINT REPLACEMENT    . MOHS SURGERY Left 01/02/2017   ear  . REPLACEMENT TOTAL KNEE Left   . TEE WITHOUT CARDIOVERSION N/A 06/03/2017   Procedure: TRANSESOPHAGEAL ECHOCARDIOGRAM (TEE) - cancelled -   . TRANSTHORACIC ECHOCARDIOGRAM  03/2016   Normal LV size with mild LV hypertrophy. EF 30%. Diffuse  hypokinesis wtih septal-lateral dyssynchrony. Normal RV size and systolic function. Mild MR and mild AI.  Marland Kitchen TRANSTHORACIC ECHOCARDIOGRAM  06/03/2017   for CVA evaluation.  Diffuse hypokinesis EF 45 to 50% with moderate focal basal septal hypertrophy.  Moderate aortic insufficiency.  Large PFO with positive bubble study. -Possible source of embolus.  Ascending aortic aneurysm unchanged.    Current Meds  Medication Sig  . clopidogrel (PLAVIX) 75 MG tablet Take 1 tablet (75 mg total) by mouth daily.  Marland Kitchen co-enzyme Q-10 30 MG capsule Take 30 mg by mouth daily.  Marland Kitchen lisinopril (PRINIVIL,ZESTRIL) 10 MG  tablet Take 1 tablet (10 mg total) by mouth daily. PLEASE CONTACT OFFICE FOR ADDITIONAL REFILLS  . MEGARED OMEGA-3 KRILL OIL 500 MG CAPS Take 1 capsule by mouth daily.  . metoprolol succinate (TOPROL-XL) 25 MG 24 hr tablet TAKE ONE TABLET BY MOUTH ONCE DAILY  . Multiple Vitamin (MULTIVITAMIN) tablet Take 1 tablet by mouth daily.  . pravastatin (PRAVACHOL) 10 MG tablet Take 10 mg by mouth 3 (three) times daily. Takes 1 tablet three times a week Monday, Wednesday, Friday  . Probiotic CAPS Take 1 capsule by mouth every other day.     Allergies  Allergen Reactions  . Tape Other (See Comments)    "PLASTIC" TAPE PULLS OFF THE SKIN AND THE REMOVAL OF ALL TAPES BRUISE HIM!!!    Social History   Tobacco Use  . Smoking status: Never Smoker  . Smokeless tobacco: Never Used  Substance Use Topics  . Alcohol use: No  . Drug use: No   Social History   Social  History Narrative  . Not on file    family history is not on file.  Wt Readings from Last 3 Encounters:  10/30/17 174 lb (78.9 kg)  09/23/17 173 lb 12.8 oz (78.8 kg)  07/23/17 177 lb 6.4 oz (80.5 kg)    PHYSICAL EXAM BP (!) 146/58   Pulse (!) 50   Ht 5\' 10"  (1.778 m)   Wt 174 lb (78.9 kg)   SpO2 97%   BMI 24.97 kg/m  Physical Exam  Constitutional: He is oriented to person, place, and time. He appears well-developed and well-nourished. No distress.  Looks younger than stated age.  Well groomed.  HENT:  Head: Normocephalic and atraumatic.  Neck: Normal range of motion. Neck supple. No hepatojugular reflux and no JVD present. Carotid bruit is present (soft L side bruit).  Cardiovascular: Normal rate, regular rhythm, S1 normal and S2 normal.  Occasional extrasystoles are present. PMI is not displaced. Exam reveals no gallop, no S4, no friction rub and no decreased pulses.  Murmur heard.  Low-pitched rumbling early diastolic murmur is present at the upper right sternal border and lower right sternal border. Pulmonary/Chest:  Effort normal and breath sounds normal. No respiratory distress. He has no wheezes. He has no rales.  Abdominal: Soft. Bowel sounds are normal. He exhibits no distension. There is no tenderness. There is no rebound.  No HSM  Musculoskeletal: Normal range of motion. He exhibits deformity (L knee a bit swollen). He exhibits no edema.  Neurological: He is alert and oriented to person, place, and time.  Skin:  Mild bruising on arms  Psychiatric: He has a normal mood and affect. His behavior is normal. Judgment and thought content normal.  Slow response to questions (hard b/c wife & daughters always answer for him). Hard of hearing. Difficulty with word finding  Vitals reviewed.    Adult ECG Report n/a  Other studies Reviewed: Additional studies/ records that were reviewed today include:  Recent Labs:   Lab Results  Component Value Date   CREATININE 1.02 06/03/2017   BUN 18 06/03/2017   NA 140 06/03/2017   K 4.5 06/03/2017   CL 109 06/03/2017   CO2 23 06/03/2017   Lab Results  Component Value Date   CHOL 134 06/01/2017   HDL 37 (L) 06/01/2017   LDLCALC 81 06/01/2017   TRIG 80 06/01/2017   CHOLHDL 3.6 06/01/2017   CBC Latest Ref Rng & Units 09/23/2017 06/04/2017 06/03/2017  WBC 4.0 - 10.3 K/uL 38.4(H) 39.3(H) 37.1(H)  Hemoglobin 13.0 - 17.1 g/dL 10.8(L) 11.2(L) 10.7(L)  Hematocrit 38.4 - 49.9 % 33.8(L) 35.0(L) 34.2(L)  Platelets 140 - 400 K/uL 140 145(L) 144(L)   ASSESSMENT / PLAN: Problem List Items Addressed This Visit    Polymorphic ventricular tachycardia (HCC) (Chronic)    Polymorphic V. Tach Noted on loop recorder after initially found on his event monitor.  Status post BiVICD for CRT which seems to actually have improved his EF quite dramatically.  No recurrent episodes noted on ICD interrogation.      Relevant Medications   pravastatin (PRAVACHOL) 10 MG tablet   NSVT (nonsustained ventricular tachycardia) (HCC) (Chronic)   Relevant Medications   pravastatin  (PRAVACHOL) 10 MG tablet   Near syncope    Initial episode was thought to be related to PMVT -- no further episodes (just recent CVA).      Relevant Medications   pravastatin (PRAVACHOL) 10 MG tablet   Ischemic cardiomyopathy: EF 40-45% reduced to ~30% - post ICD up  to 45-50%. - Primary (Chronic)    Interesting.  He had probably reduced EF in the setting of underlying ischemic cardia myopathy.  Now that he is status post by BiVICD with effective but increasing resynchronization, his EF is improved.  He is really only having to be class I to heart failure symptoms with no edema or PND/orthopnea.  Euvolemic on exam Stable dose of metoprolol and lisinopril.  No diuretic requirement.        Relevant Medications   pravastatin (PRAVACHOL) 10 MG tablet   Hyperlipidemia (Chronic)    Started on high-dose atorvastatin for stroke.  Now he is on pravastatin very low-dose 3 days a week and still feels aching and concern for elevated LFTs.  I do feel he does stop it, but I encouraged him to continue in the short-term based on his recent stroke.      Relevant Medications   pravastatin (PRAVACHOL) 10 MG tablet   Essential hypertension (Chronic)    Well-controlled.  Blood pressure well however now I would not want to be more aggressive.  We do not reduce orthostatic hypotension since he already does have some dizziness..      Relevant Medications   pravastatin (PRAVACHOL) 10 MG tablet   CAD S/P percutaneous coronary angioplasty (Chronic)    Very distant history of PCI.  No active angina symptoms.  Now he is on lovastatin along with beta-blocker And ACE inhibitor. He is actually now on Plavix for his stroke.  Initially was on aspirin Plavix now just Plavix.      Relevant Medications   pravastatin (PRAVACHOL) 10 MG tablet      I spent a total of 45minutes with the patient and chart review. >  50% of the time was spent in direct patient consultation.   Current medicines are reviewed at length  with the patient today.  (+/- concerns) n/a The following changes have been made:  n/a  Patient Instructions  Medication Instructions:  Your physician recommends that you continue on your current medications as directed. Please refer to the Current Medication list given to you today.   Labwork: none  Testing/Procedures: none  Follow-Up: Your physician wants you to follow-up in: March 2020 with Dr. Ellyn Hack. You will receive a reminder letter in the mail two months in advance. If you don't receive a letter, please call our office to schedule the follow-up appointment.   Any Other Special Instructions Will Be Listed Below (If Applicable).     If you need a refill on your cardiac medications before your next appointment, please call your pharmacy.       Studies Ordered:   No orders of the defined types were placed in this encounter.     Glenetta Hew, M.D., M.S. Interventional Cardiologist   Pager # [phone] Phone # [phone] [address] [place],  [address]   Thank you for choosing [facility] at [facility]!!

## 2017-11-02 ENCOUNTER — Encounter: Payer: Self-pay | Admitting: Cardiology

## 2017-11-02 DIAGNOSIS — R55 Syncope and collapse: Secondary | ICD-10-CM | POA: Insufficient documentation

## 2017-11-02 NOTE — Assessment & Plan Note (Signed)
Polymorphic V. Tach Noted on loop recorder after initially found on his event monitor.  Status post BiVICD for CRT which seems to actually have improved his EF quite dramatically.  No recurrent episodes noted on ICD interrogation.

## 2017-11-02 NOTE — Assessment & Plan Note (Signed)
Very distant history of PCI.  No active angina symptoms.  Now he is on lovastatin along with beta-blocker And ACE inhibitor. He is actually now on Plavix for his stroke.  Initially was on aspirin Plavix now just Plavix.

## 2017-11-02 NOTE — Assessment & Plan Note (Signed)
Initial episode was thought to be related to PMVT -- no further episodes (just recent CVA).

## 2017-11-02 NOTE — Assessment & Plan Note (Signed)
Started on high-dose atorvastatin for stroke.  Now he is on pravastatin very low-dose 3 days a week and still feels aching and concern for elevated LFTs.  I do feel he does stop it, but I encouraged him to continue in the short-term based on his recent stroke.

## 2017-11-02 NOTE — Assessment & Plan Note (Signed)
Well-controlled.  Blood pressure well however now I would not want to be more aggressive.  We do not reduce orthostatic hypotension since he already does have some dizziness.Gregory Rodgers

## 2017-11-02 NOTE — Assessment & Plan Note (Signed)
Interesting.  He had probably reduced EF in the setting of underlying ischemic cardia myopathy.  Now that he is status post by BiVICD with effective but increasing resynchronization, his EF is improved.  He is really only having to be class I to heart failure symptoms with no edema or PND/orthopnea.  Euvolemic on exam Stable dose of metoprolol and lisinopril.  No diuretic requirement.

## 2017-11-02 NOTE — Assessment & Plan Note (Deleted)
Polymorphic V. Tach Noted on loop recorder after initially found on his event monitor.  Status post BiVICD for CRT which seems to actually have improved his EF quite dramatically.  No recurrent episodes noted on ICD interrogation.

## 2017-11-25 NOTE — Progress Notes (Signed)
[name] [address] [place]. [place] [address] [phone]       OFFICE FOLLOW UP NOTE  [name] Date of Birth:  12-11-1927 Medical Record Number:  [id number]   Reason for Referral:  stroke follow up  CHIEF COMPLAINT:  Chief Complaint  Patient presents with  . Follow-up    CVA follow up room 9 patient is with Patrica his wife, and Ramona his daughter     HPI: Gregory Rodgers is being seen today in the office for left MCA stroke on 06/01/17. History obtained from patient and chart review. Reviewed all radiology images and labs personally.  Gregory Rodgers is a 84 y.o. male with history of coronary artery disease with previous MI, left bundle branch block, borderline diabetes, hyperlipidemia, history of renal calculi, hypertension, chronic lymphocytic leukemia, CHF, cardiomyopathy with symptomatic nonsustained V. tach, and status post placement of a biventricular pacemaker/AICD presenting with transient speech problems, confusion, transient visual loss, and right lower extremity weakness. He did not receive IV t-PA due to late presentation.  MRI brain reviewed and showed multiple left MCA territory infarcts.  MRA head/neck showed nonocclusive filling defect in the left carotid bulb compatible with thrombus.  2D echo showed EF of 40-55% but no cardiac source of emboli was identified.  TEE did show a large PFO. LDL 81 and A1c 6.0.  Patient was previously on aspirin 81 mg PTA and was recommended to take aspirin 81 mg and Plavix 75 mg daily..  Recommended for patient to start Lipitor 40 mg daily.  Due to left ICA thrombus, he was started on IV heparin in the hospital but switch to aspirin and Plavix at discharge by Dr. Estanislado Pandy.  Patient discharged home with PT and speech therapy.   07/23/17 visit: Since discharge, patient has been doing well and is accompanied today's visit by wife and daughter.  He continues to do physical therapy where he does have 1 more  session but will continue speech therapy for 2 more weeks.  He did fall recently and fell on right arm and was seen at an urgent care.   he was complaining of right shoulder pain/right arm pain but this has been improving over time.  Blood pressure slightly elevated at 151/60 but they do check blood pressure at home and typically runs SBP 130-140.  Wife is requesting orthotic  for leg length discrepancy as recommended by PT.  Patient's PCP recently closed down and is undergoing process of finding a new PCP.  No further complaints at this time.  No new or worsening stroke/TIA symptoms.  Interval history 11/26/17: Patient is being seen today for stroke follow-up and is accompanied by his wife and daughter.  He continues to have some complaints of dysarthria but states this does wax and wane.  Unable to correlate worsening of speech due to fatigue or stress.  He continues to do speech exercises at home on his own.  He continues Plavix with bruising but no bleeding.  Lipitor was stopped by PCP after patient was complaining of abdominal pain and was found to have increased liver function panel.  He was started on pravastatin at that time and due to continued myalgias it was recommended to take pravastatin 10 mg 3 times weekly.  At today's appointment, he was continuing to complain of shoulder pain and abdominal pain.  Unable to see PCP notes or lab work that was obtained by PCP, but wife and daughter both stated that LFTs normalized  after stopping of Lipitor.  Recommended to stop pravastatin for possible side effect of myalgias.  If myalgias resolve, recommended to have repeat lab work at follow-up appointment with PCP in October and continue close follow-up with lipid panel.  If myalgias do not resolve, recommended to restart pravastatin and follow-up with PCP as myalgias may be due to to underlying condition.  Blood pressure today mildly elevated at 142/60 and wife states she occasionally check this at home and is  typically SBP 130s.  Denies new or worsening stroke/TIA symptoms. On a side note, wife states that he has occasional vertigo which she has had for 20+ years.  Patient is unsure if this vertigo occurs with position change or rapid head movement.  Wife believes that this is when he complains of increased dizziness sensation after he bends down to pick something up or with position change.  As the symptoms are only on occasion, recommended to avoid rapid position changes or head movement.  Advised that if these become more frequent or intolerable, patient can try PT for possible BPPV.   ROS:   14 system review of systems performed and negative with exception of fatigue, hearing loss, leg swelling, frequency of urination, joint pain, aching muscles, walking difficulty, bruise easily, dizziness, speech difficulty and weakness  PMH:  Past Medical History:  Diagnosis Date  . Basal cell carcinoma    "numerous places"  . Borderline diabetes   . Cardiomyopathy, dilated ([place]) 03/2016   a) EF ~30% with diffuse HK (not simply ICM) ? partially due to LBBB (septal-lateral dyssynchrony). b) f/u Echo after BiV ICD = EF 45-50%  . CLL (chronic lymphocytic leukemia) ([place]) 09/2009   stage 0; on observation  . Coronary artery disease involving native heart without angina pectoris    PTCA of RCA in setting of an MI in 1986-87; no follow-up study since available.  . Essential hypertension   . History of kidney stones   . Hyperlipidemia with target LDL less than 70    With known CAD  . 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 Left bundle branch block (LBBB) on electrocardiogram    Noted as far back as 2008.  . Old inferior wall myocardial infarction 1986/87   Had PTCA most likely of the RCA    PSH:  Past Surgical History:  Procedure Laterality Date  . BASAL CELL CARCINOMA EXCISION     "numerous"  . BIV ICD INSERTION CRT-D N/A 01/09/2017   Procedure: BIV ICD INSERTION CRT-D;   Surgeon: Evans Lance, MD;  Location: [facility];  Service: Cardiovascular;  Laterality: N/A;  . CATARACT EXTRACTION W/ INTRAOCULAR LENS  IMPLANT, BILATERAL Bilateral   . CORONARY ANGIOPLASTY  1986/1987  . defibllator    . dual pacemaker    . EP IMPLANTABLE DEVICE N/A 05/10/2016   Procedure: Loop Recorder Insertion;  Surgeon: Evans Lance, MD;  Location: [facility];  Service: Cardiovascular;  Laterality: N/A;  slight blood drop on dressing  . JOINT REPLACEMENT    . MOHS SURGERY Left 01/02/2017   ear  . REPLACEMENT TOTAL KNEE Left   . TEE WITHOUT CARDIOVERSION N/A 06/03/2017   Procedure: TRANSESOPHAGEAL ECHOCARDIOGRAM (TEE) - cancelled -   . TRANSTHORACIC ECHOCARDIOGRAM  03/2016   Normal LV size with mild LV hypertrophy. EF 30%. Diffuse  hypokinesis wtih septal-lateral dyssynchrony. Normal RV size and systolic function. Mild MR and mild AI.  Marland Kitchen TRANSTHORACIC ECHOCARDIOGRAM  06/03/2017   for CVA evaluation.  Diffuse hypokinesis EF 45 to 50% with moderate focal basal septal hypertrophy.  Moderate aortic insufficiency.  Large PFO with positive bubble study. -Possible source of embolus.  Ascending aortic aneurysm unchanged.    Social History:  Social History   Socioeconomic History  . Marital status: Married    Spouse name: Not on file  . Number of children: Not on file  . Years of education: Not on file  . Highest education level: Not on file  Occupational History  . Not on file  Social Needs  . Financial resource strain: Not on file  . Food insecurity:    Worry: Not on file    Inability: Not on file  . Transportation needs:    Medical: Not on file    Non-medical: Not on file  Tobacco Use  . Smoking status: Never Smoker  . Smokeless tobacco: Never Used  Substance and Sexual Activity  . Alcohol use: No  . Drug use: No  . Sexual activity: Not on file  Lifestyle  . Physical activity:    Days per week: Not on file    Minutes per session: Not on file  .  Stress: Not on file  Relationships  . Social connections:    Talks on phone: Not on file    Gets together: Not on file    Attends religious service: Not on file    Active member of club or organization: Not on file    Attends meetings of clubs or organizations: Not on file    Relationship status: Not on file  . Intimate partner violence:    Fear of current or ex partner: Not on file    Emotionally abused: Not on file    Physically abused: Not on file    Forced sexual activity: Not on file  Other Topics Concern  . Not on file  Social History Narrative  . Not on file    Family History:  Family History  Problem Relation Age of Onset  . Sudden Cardiac Death Neg Hx     Medications:   Current Outpatient Medications on File Prior to Visit  Medication Sig Dispense Refill  . atorvastatin (LIPITOR) 20 MG tablet Take 20 mg by mouth daily.  1  . clopidogrel (PLAVIX) 75 MG tablet Take 1 tablet (75 mg total) by mouth daily. 30 tablet 3  . co-enzyme Q-10 30 MG capsule Take 30 mg by mouth daily.    Marland Kitchen lisinopril (PRINIVIL,ZESTRIL) 10 MG tablet Take 1 tablet (10 mg total) by mouth daily. PLEASE CONTACT OFFICE FOR ADDITIONAL REFILLS 15 tablet 0  . MEGARED OMEGA-3 KRILL OIL 500 MG CAPS Take 1 capsule by mouth daily.    . metoprolol succinate (TOPROL-XL) 25 MG 24 hr tablet TAKE ONE TABLET BY MOUTH ONCE DAILY 30 tablet 9  . Multiple Vitamin (MULTIVITAMIN) tablet Take 1 tablet by mouth daily.    . pravastatin (PRAVACHOL) 10 MG tablet Take 10 mg by mouth 3 (three) times daily. Takes 1 tablet three times a week Monday, Wednesday, Friday    . Probiotic CAPS Take 1 capsule by mouth every other day.      No current facility-administered medications on file prior to visit.     Allergies:   Allergies  Allergen Reactions  . Tape Other (See Comments)    "PLASTIC" TAPE PULLS OFF THE SKIN AND THE REMOVAL OF ALL TAPES BRUISE HIM!!!     Physical Exam  Vitals:   11/26/17  1026  BP: (!) 142/60    Pulse: (!) 49  Weight: 173 lb 12.8 oz (78.8 kg)   Body mass index is 24.94 kg/m. No exam data present  General: well developed, pleasant elderly Caucasian male, well nourished, seated, in no evident distress Head: head normocephalic and atraumatic.   Neck: supple with no carotid or supraclavicular bruits Cardiovascular: regular rate and rhythm, no murmurs Musculoskeletal: no deformity Skin:  no rash/petichiae Vascular:  Normal pulses all extremities  Neurologic Exam Mental Status: Awake and fully alert.  Mild expressive aphasia.  Oriented to place and time. Recent and remote memory intact. Attention span, concentration and fund of knowledge appropriate. Mood and affect appropriate.  Cranial Nerves: Fundoscopic exam reveals sharp disc margins. Pupils equal, briskly reactive to light. Extraocular movements full without nystagmus. Visual fields full to confrontation. Hearing intact. Facial sensation intact. Face, tongue, palate moves normally and symmetrically.  Motor: Normal bulk and tone. Normal strength in all tested extremity muscles. Sensory.: intact to touch , pinprick , position and vibratory sensation.  Coordination: Rapid alternating movements normal in all extremities. Finger-to-nose and heel-to-shin performed accurately bilaterally. Gait and Station: Arises from chair without difficulty. Stance is normal. Gait demonstrates normal stride length and balance . Able to heel, toe and tandem walk without difficulty.  Reflexes: 1+ and symmetric. Toes downgoing.     Diagnostic Data (Labs, Imaging, Testing)   Mr [name] Neck W Wo Contrast Mr [name] Wo Contrast 06/01/2017   IMPRESSION:  Multiple small acute infarcts in the left MCA territory.  Irregular plaque proximal left internal carotid artery with 60% diameter narrowing.  Filling defect in the left carotid bulb compatible with thrombus.  This is likely a cause of embolic infarctions in the left MCA territory.  Atrophy and  chronic microvascular ischemia.  No intracranial large vessel occlusion.    Transthoracic Echocardiogram  06/01/2017 Study Conclusions - Left ventricle: The cavity size was normal. There was severe focal basal hypertrophy of the septum with otherwise mild concentric hypertrophy. Systolic function was mildly to moderately reduced. The estimated ejection fraction was in the range of 40% to 45%. Wall motion was normal; there were no regional wall motion abnormalities. Doppler parameters are consistent with abnormal left ventricular relaxation (grade 1 diastolic dysfunction). - Aortic valve: Transvalvular velocity was within the normal range. There was no stenosis. There was moderate regurgitation. Regurgitation pressure half-time: 348 ms. - Aorta: Ascending aortic diameter: 42 mm (S). - Ascending aorta: The ascending aorta was mildly dilated. - Mitral valve: Transvalvular velocity was within the normal range. There was no evidence for stenosis. There was mild regurgitation. - Left atrium: The atrium was mildly dilated. - Right ventricle: The cavity size was normal. Wall thickness was normal. Systolic function was normal. - Tricuspid valve: There was trivial regurgitation. Impressions: - Compared with the prior echo 33/2951, systolic function has improved. The ascending aortic aneurysm is unchanged.  Echo TEE Result date: 06/04/2017 Study Conclusions  is- Left ventricle: Normal LV size with moderate focal basal septal hypertrophy. EF 45-50%, diffuse hypokinesis. - Aortic valve: There was no stenosis. There was moderate regurgitation. - Aorta: Normal caliber but tortuous thoracic aorta with grade III plaque in the descending thoracic aorta. - Mitral valve: There was trivial regurgitation. - Left atrium: The atrium was mildly dilated. No evidence of thrombus in the atrial cavity or appendage. - Right ventricle: The cavity size was normal. Pacer  wire or catheter noted in right ventricle. Systolic function was normal. - Right atrium: No  evidence of thrombus in the atrial cavity or appendage. - Atrial septum: There was a large PFO, bubble study markedly positive. Impressions: - PFO is possible source of embolus. Left MCA   ASSESSMENT: Gregory Rodgers is a 84 y.o. year old male here with stroke on 06/01/2017 secondary to left ICA thrombus. Vascular risk factors include HTN, HLD and CAD.    PLAN: -Continue clopidogrel 75 mg daily  and pravastatin for secondary stroke prevention -We will trial stop pravastatin for possible myalgias pain.  Advised to wait 1 to 2 weeks and if pain is resolved, recommend to follow-up with PCP in October as scheduled for repeat lipid panel and close monitoring of these levels for secondary stroke prevention.  If myalgias do not resolve after 1 to 2 weeks, recommended to restart pravastatin at previous dose (10 mg 3 times weekly) and follow-up with PCP for possible underlying causes of myalgias -wife and daughter verbalized understanding -F/u with PCP regarding your HLD, HTN and CAD management -continue to monitor BP at home -Advised to continue to stay active and maintain a healthy diet -Maintain strict control of hypertension with blood pressure goal below 130/90, diabetes with hemoglobin A1c goal below 6.5% and cholesterol with LDL cholesterol (bad cholesterol) goal below 70 mg/dL. I also advised the patient to eat a healthy diet with plenty of whole grains, cereals, fruits and vegetables, exercise regularly and maintain ideal body weight.  Follow up in 6 months or call earlier if needed  Greater than 50% time during this 25 minute consultation visit was spent on counseling and coordination of care about HLD, and HTN, discussion about risk benefit of anticoagulation and answering questions.    Venancio Poisson, AGNP-BC  [facility] [address] [place],  [place] [address]  Phone [phone] Fax [phone]

## 2017-11-26 ENCOUNTER — Encounter: Payer: Self-pay | Admitting: Adult Health

## 2017-11-26 ENCOUNTER — Ambulatory Visit (INDEPENDENT_AMBULATORY_CARE_PROVIDER_SITE_OTHER): Payer: PPO | Admitting: Adult Health

## 2017-11-26 VITALS — BP 142/60 | HR 49 | Wt 173.8 lb

## 2017-11-26 DIAGNOSIS — I10 Essential (primary) hypertension: Secondary | ICD-10-CM

## 2017-11-26 DIAGNOSIS — I63312 Cerebral infarction due to thrombosis of left middle cerebral artery: Secondary | ICD-10-CM | POA: Diagnosis not present

## 2017-11-26 DIAGNOSIS — E785 Hyperlipidemia, unspecified: Secondary | ICD-10-CM | POA: Diagnosis not present

## 2017-11-26 NOTE — Patient Instructions (Addendum)
Continue clopidogrel 75 mg daily for secondary stroke prevention  As you possibly have side effects from continued statin or pravastatin use, we will try to stop for 1-2 weeks. If your muscle aches and pain resolve, do not restart the medication and follow up with PCP for repeat cholesterol levels as scheduled. If your muscle aches and pains do not resolve, restart pravastatin at previous dose and follow up with PCP regarding possible underlying conditions to cause pains along with checking CK level, ESR and CRP rate.   Continue to follow up with PCP regarding cholesterol and blood pressure management   Continue to stay active and maintain a healthy diet  Continue to monitor blood pressure at home  Maintain strict control of hypertension with blood pressure goal below 130/90, diabetes with hemoglobin A1c goal below 6.5% and cholesterol with LDL cholesterol (bad cholesterol) goal below 70 mg/dL. I also advised the patient to eat a healthy diet with plenty of whole grains, cereals, fruits and vegetables, exercise regularly and maintain ideal body weight.  Followup in the future with me in 6 months or call earlier if needed         Thank you for coming to see [place] at [facility]. I hope we have been able to provide you high quality care today.  You may receive a patient satisfaction survey over the next few weeks. We would appreciate your feedback and comments so that we may continue to improve ourselves and the health of our patients.

## 2017-11-28 NOTE — Progress Notes (Signed)
I agree with the above plan 

## 2017-12-18 DIAGNOSIS — Z23 Encounter for immunization: Secondary | ICD-10-CM | POA: Diagnosis not present

## 2017-12-20 ENCOUNTER — Ambulatory Visit (INDEPENDENT_AMBULATORY_CARE_PROVIDER_SITE_OTHER): Payer: PPO | Admitting: Sports Medicine

## 2017-12-20 ENCOUNTER — Encounter: Payer: Self-pay | Admitting: Sports Medicine

## 2017-12-20 VITALS — BP 134/68 | HR 60 | Resp 15

## 2017-12-20 DIAGNOSIS — M79675 Pain in left toe(s): Secondary | ICD-10-CM

## 2017-12-20 DIAGNOSIS — I739 Peripheral vascular disease, unspecified: Secondary | ICD-10-CM

## 2017-12-20 DIAGNOSIS — M79674 Pain in right toe(s): Secondary | ICD-10-CM

## 2017-12-20 DIAGNOSIS — Z8673 Personal history of transient ischemic attack (TIA), and cerebral infarction without residual deficits: Secondary | ICD-10-CM

## 2017-12-20 DIAGNOSIS — B351 Tinea unguium: Secondary | ICD-10-CM

## 2017-12-20 DIAGNOSIS — M21371 Foot drop, right foot: Secondary | ICD-10-CM

## 2017-12-20 NOTE — Progress Notes (Signed)
Subjective: Gregory Rodgers is a 84 y.o. male patient seen today in office with complaint of mildly painful thickened and elongated toenails; unable to trim.  Patient denies any changes with medical history since last visit.  Remains on Plavix for history of stroke.  Patient uses a brace on right foot foot drop. Patient has no other pedal complaints at this time.   Patient Active Problem List   Diagnosis Date Noted  . Near syncope 11/02/2017  . History of skin cancer 09/23/2017  . Transient speech disturbance 06/01/2017  . 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 Cerebral embolism with cerebral infarction 06/01/2017  . Polymorphic ventricular tachycardia ([place]) 01/09/2017  . NSVT (nonsustained ventricular tachycardia) ([place]) 05/01/2016  . Left bundle branch block (LBBB) on electrocardiogram 02/27/2016  . Dizziness of unknown cause 02/27/2016  . Bradycardia with 41-50 beats per minute 02/27/2016  . Ischemic cardiomyopathy: EF 40-45% reduced to ~30% - post ICD up to 45-50%. 02/27/2016  . Pancytopenia, acquired ([place]) 09/30/2015  . Thrombocytopenia ([place]) 10/01/2014  . 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 CLL (chronic lymphocytic leukemia) ([place]) 06/20/2011  . Hyperlipidemia 06/20/2011  . Essential hypertension 06/20/2011  . Borderline diabetes 06/20/2011  . CAD S/P percutaneous coronary angioplasty 02/26/1985    Current Outpatient Medications on File Prior to Visit  Medication Sig Dispense Refill  . Acetaminophen (TYLENOL 8 HOUR PO) Take by mouth.    Marland Kitchen atorvastatin (LIPITOR) 20 MG tablet Take 20 mg by mouth daily.  1  . clopidogrel (PLAVIX) 75 MG tablet Take 1 tablet (75 mg total) by mouth daily. 30 tablet 3  . co-enzyme Q-10 30 MG capsule Take 30 mg by mouth daily.    Marland Kitchen lisinopril (PRINIVIL,ZESTRIL) 10 MG tablet Take 1 tablet (10 mg total) by mouth daily. PLEASE CONTACT OFFICE FOR ADDITIONAL REFILLS 15 tablet 0  . MEGARED OMEGA-3 KRILL OIL 500 MG CAPS Take 1 capsule by  mouth daily.    . metoprolol succinate (TOPROL-XL) 25 MG 24 hr tablet TAKE ONE TABLET BY MOUTH ONCE DAILY 30 tablet 9  . Multiple Vitamin (MULTIVITAMIN) tablet Take 1 tablet by mouth daily.    . pravastatin (PRAVACHOL) 10 MG tablet Take 10 mg by mouth 3 (three) times daily. Takes 1 tablet three times a week Monday, Wednesday, Friday    . Probiotic CAPS Take 1 capsule by mouth every other day.      No current facility-administered medications on file prior to visit.     Allergies  Allergen Reactions  . Tape Other (See Comments)    "PLASTIC" TAPE PULLS OFF THE SKIN AND THE REMOVAL OF ALL TAPES BRUISE HIM!!!    Objective: Physical Exam  General: Well developed, nourished, no acute distress, awake, alert and oriented x 3  Vascular: Dorsalis pedis artery 1/4 bilateral, Posterior tibial artery 1/4 bilateral, skin temperature warm to warm proximal to distal bilateral lower extremities, ++ varicosities, pedal hair present bilateral.  Neurological: Gross sensation present via light touch bilateral.   Dermatological: Skin is warm, dry, and supple bilateral, Nails 1-10 are tender, long, thick, and discolored with mild subungal debris and old dry blood that is much improved from prior, no webspace macerations present bilateral, no open lesions present bilateral, no callus/corns/hyperkeratotic tissue present bilateral. No signs of infection bilateral.  Musculoskeletal: + foot drop and weakness on right.  Assessment and Plan:  Problem List Items Addressed This Visit    None    Visit Diagnoses    Pain due to onychomycosis of toenails  of both feet    -  Primary   PVD (peripheral vascular disease) ([place])       Foot drop, right       History of stroke         -Examined patient.  -Re-Discussed treatment options for painful mycotic nails. -Mechanically debrided and reduced mycotic nails with sterile nail nipper and dremel nail file without incident. -Continue with AFO on right and good  supportive shoes -Patient to return in 3 months for follow up evaluation or sooner if symptoms worsen.  Landis Martins, DPM

## 2017-12-25 DIAGNOSIS — H6123 Impacted cerumen, bilateral: Secondary | ICD-10-CM | POA: Diagnosis not present

## 2017-12-25 DIAGNOSIS — H6690 Otitis media, unspecified, unspecified ear: Secondary | ICD-10-CM | POA: Diagnosis not present

## 2017-12-25 DIAGNOSIS — R350 Frequency of micturition: Secondary | ICD-10-CM | POA: Diagnosis not present

## 2017-12-25 DIAGNOSIS — R42 Dizziness and giddiness: Secondary | ICD-10-CM | POA: Diagnosis not present

## 2018-01-02 DIAGNOSIS — H5203 Hypermetropia, bilateral: Secondary | ICD-10-CM | POA: Diagnosis not present

## 2018-01-02 DIAGNOSIS — H524 Presbyopia: Secondary | ICD-10-CM | POA: Diagnosis not present

## 2018-01-02 DIAGNOSIS — H353132 Nonexudative age-related macular degeneration, bilateral, intermediate dry stage: Secondary | ICD-10-CM | POA: Diagnosis not present

## 2018-01-03 DIAGNOSIS — L570 Actinic keratosis: Secondary | ICD-10-CM | POA: Diagnosis not present

## 2018-01-07 ENCOUNTER — Ambulatory Visit: Payer: PPO | Admitting: Internal Medicine

## 2018-01-07 ENCOUNTER — Encounter: Payer: Self-pay | Admitting: Internal Medicine

## 2018-01-07 VITALS — BP 140/70 | HR 50 | Ht 69.0 in | Wt 178.2 lb

## 2018-01-07 DIAGNOSIS — Z95810 Presence of automatic (implantable) cardiac defibrillator: Secondary | ICD-10-CM | POA: Diagnosis not present

## 2018-01-07 DIAGNOSIS — I447 Left bundle-branch block, unspecified: Secondary | ICD-10-CM

## 2018-01-07 DIAGNOSIS — I255 Ischemic cardiomyopathy: Secondary | ICD-10-CM

## 2018-01-07 DIAGNOSIS — R001 Bradycardia, unspecified: Secondary | ICD-10-CM

## 2018-01-07 LAB — CUP PACEART INCLINIC DEVICE CHECK
Date Time Interrogation Session: 20190924134402
HIGH POWER IMPEDANCE MEASURED VALUE: 67.5 Ohm
Implantable Lead Implant Date: 20180926
Implantable Lead Implant Date: 20180926
Implantable Lead Implant Date: 20180926
Implantable Lead Location: 753858
Implantable Lead Location: 753859
Implantable Lead Model: 7122
Implantable Pulse Generator Implant Date: 20180926
Lead Channel Impedance Value: 437.5 Ohm
Lead Channel Pacing Threshold Amplitude: 0.5 V
Lead Channel Pacing Threshold Amplitude: 0.75 V
Lead Channel Pacing Threshold Amplitude: 1 V
Lead Channel Pacing Threshold Pulse Width: 0.5 ms
Lead Channel Pacing Threshold Pulse Width: 0.5 ms
Lead Channel Pacing Threshold Pulse Width: 0.8 ms
Lead Channel Pacing Threshold Pulse Width: 0.8 ms
Lead Channel Sensing Intrinsic Amplitude: 11.6 mV
Lead Channel Setting Pacing Amplitude: 2 V
Lead Channel Setting Pacing Pulse Width: 0.8 ms
MDC IDC LEAD LOCATION: 753860
MDC IDC MSMT BATTERY REMAINING LONGEVITY: 66 mo
MDC IDC MSMT LEADCHNL LV IMPEDANCE VALUE: 687.5 Ohm
MDC IDC MSMT LEADCHNL LV PACING THRESHOLD AMPLITUDE: 1 V
MDC IDC MSMT LEADCHNL RA PACING THRESHOLD AMPLITUDE: 0.75 V
MDC IDC MSMT LEADCHNL RA PACING THRESHOLD PULSEWIDTH: 0.5 ms
MDC IDC MSMT LEADCHNL RA SENSING INTR AMPL: 2.9 mV
MDC IDC MSMT LEADCHNL RV IMPEDANCE VALUE: 437.5 Ohm
MDC IDC MSMT LEADCHNL RV PACING THRESHOLD AMPLITUDE: 0.5 V
MDC IDC MSMT LEADCHNL RV PACING THRESHOLD PULSEWIDTH: 0.5 ms
MDC IDC SET LEADCHNL LV PACING AMPLITUDE: 2.5 V
MDC IDC SET LEADCHNL RV PACING AMPLITUDE: 2.5 V
MDC IDC SET LEADCHNL RV PACING PULSEWIDTH: 0.5 ms
MDC IDC SET LEADCHNL RV SENSING SENSITIVITY: 0.5 mV
MDC IDC STAT BRADY RA PERCENT PACED: 20 %
MDC IDC STAT BRADY RV PERCENT PACED: 98 %
Pulse Gen Serial Number: 9764296

## 2018-01-07 NOTE — Progress Notes (Signed)
HPI Mr Carbonell returns today for ongoing evaluation of VT, chronic systolic heart failure, LBBB, s/p biv ICD insertion. The patient has done well in the interim. No chest pain or sob. He remains active.  Allergies  Allergen Reactions  . Tape Other (See Comments)    "PLASTIC" TAPE PULLS OFF THE SKIN AND THE REMOVAL OF ALL TAPES BRUISE HIM!!!     Current Outpatient Medications  Medication Sig Dispense Refill  . Acetaminophen (TYLENOL 8 HOUR PO) Take by mouth.    . clopidogrel (PLAVIX) 75 MG tablet Take 1 tablet (75 mg total) by mouth daily. 30 tablet 3  . co-enzyme Q-10 30 MG capsule Take 30 mg by mouth daily.    Marland Kitchen lisinopril (PRINIVIL,ZESTRIL) 10 MG tablet Take 1 tablet (10 mg total) by mouth daily. PLEASE CONTACT OFFICE FOR ADDITIONAL REFILLS 15 tablet 0  . MEGARED OMEGA-3 KRILL OIL 500 MG CAPS Take 1 capsule by mouth daily.    . metoprolol succinate (TOPROL-XL) 25 MG 24 hr tablet TAKE ONE TABLET BY MOUTH ONCE DAILY 30 tablet 9  . Multiple Vitamin (MULTIVITAMIN) tablet Take 1 tablet by mouth daily.     No current facility-administered medications for this visit.      Past Medical History:  Diagnosis Date  . Basal cell carcinoma    "numerous places"  . Borderline diabetes   . Cardiomyopathy, dilated ([place]) 03/2016   a) EF ~30% with diffuse HK (not simply ICM) ? partially due to LBBB (septal-lateral dyssynchrony). b) f/u Echo after BiV ICD = EF 45-50%  . CLL (chronic lymphocytic leukemia) ([place]) 09/2009   stage 0; on observation  . Coronary artery disease involving native heart without angina pectoris    PTCA of RCA in setting of an MI in 1986-87; no follow-up study since available.  . Essential hypertension   . History of kidney stones   . Hyperlipidemia with target LDL less than 70    With known CAD  . 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 Left bundle branch block (LBBB) on electrocardiogram    Noted as far back as 2008.  . Old inferior  wall myocardial infarction 1986/87   Had PTCA most likely of the RCA    ROS:   All systems reviewed and negative except as noted in the HPI.   Past Surgical History:  Procedure Laterality Date  . BASAL CELL CARCINOMA EXCISION     "numerous"  . BIV ICD INSERTION CRT-D N/A 01/09/2017   Procedure: BIV ICD INSERTION CRT-D;  Surgeon: Evans Lance, MD;  Location: [facility];  Service: Cardiovascular;  Laterality: N/A;  . CATARACT EXTRACTION W/ INTRAOCULAR LENS  IMPLANT, BILATERAL Bilateral   . CORONARY ANGIOPLASTY  1986/1987  . defibllator    . dual pacemaker    . EP IMPLANTABLE DEVICE N/A 05/10/2016   Procedure: Loop Recorder Insertion;  Surgeon: Evans Lance, MD;  Location: [facility];  Service: Cardiovascular;  Laterality: N/A;  slight blood drop on dressing  . JOINT REPLACEMENT    . MOHS SURGERY Left 01/02/2017   ear  . REPLACEMENT TOTAL KNEE Left   . TEE WITHOUT CARDIOVERSION N/A 06/03/2017   Procedure: TRANSESOPHAGEAL ECHOCARDIOGRAM (TEE) - cancelled -   . TRANSTHORACIC ECHOCARDIOGRAM  03/2016   Normal LV size with mild LV hypertrophy. EF 30%. Diffuse  hypokinesis wtih septal-lateral dyssynchrony. Normal RV size and systolic function. Mild MR and mild  AI.  . TRANSTHORACIC ECHOCARDIOGRAM  06/03/2017   for CVA evaluation.  Diffuse hypokinesis EF 45 to 50% with moderate focal basal septal hypertrophy.  Moderate aortic insufficiency.  Large PFO with positive bubble study. -Possible source of embolus.  Ascending aortic aneurysm unchanged.     Family History  Problem Relation Age of Onset  . Sudden Cardiac Death Neg Hx      Social History   Socioeconomic History  . Marital status: Married    Spouse name: Not on file  . Number of children: Not on file  . Years of education: Not on file  . Highest education level: Not on file  Occupational History  . Not on file  Social Needs  . Financial resource strain: Not on file  . Food insecurity:    Worry: Not on  file    Inability: Not on file  . Transportation needs:    Medical: Not on file    Non-medical: Not on file  Tobacco Use  . Smoking status: Never Smoker  . Smokeless tobacco: Never Used  Substance and Sexual Activity  . Alcohol use: No  . Drug use: No  . Sexual activity: Not on file  Lifestyle  . Physical activity:    Days per week: Not on file    Minutes per session: Not on file  . Stress: Not on file  Relationships  . Social connections:    Talks on phone: Not on file    Gets together: Not on file    Attends religious service: Not on file    Active member of club or organization: Not on file    Attends meetings of clubs or organizations: Not on file    Relationship status: Not on file  . Intimate partner violence:    Fear of current or ex partner: Not on file    Emotionally abused: Not on file    Physically abused: Not on file    Forced sexual activity: Not on file  Other Topics Concern  . Not on file  Social History Narrative  . Not on file     BP 140/70   Pulse (!) 50   Ht 5\' 9"  (1.753 m)   Wt 178 lb 3.2 oz (80.8 kg)   SpO2 97%   BMI 26.32 kg/m   Physical Exam:  Well appearing NAD HEENT: Unremarkable Neck:  No JVD, no thyromegally Lymphatics:  No adenopathy Back:  No CVA tenderness Lungs:  Clear HEART:  Regular rate rhythm, no murmurs, no rubs, no clicks Abd:  soft, positive bowel sounds, no organomegally, no rebound, no guarding Ext:  2 plus pulses, no edema, no cyanosis, no clubbing Skin:  No rashes no nodules Neuro:  CN II through XII intact, motor grossly intact  EKG - NSR with biv pacing  DEVICE  Normal device function.  See PaceArt for details.   Assess/Plan: 1. ICD - his [employer] biv ICD is working normally. We will recheck in several months 2. Chronic systolic heart failure - his symptoms are class 2. No change 3. VT - he has had no recurrent ventricular arrhythmias and no ICD therapies. Continue current meds.  Mikle Bosworth.D.

## 2018-01-07 NOTE — Patient Instructions (Signed)
Medication Instructions:  Your physician recommends that you continue on your current medications as directed. Please refer to the Current Medication list given to you today.  Labwork: None ordered.  Testing/Procedures: None ordered.  Follow-Up: Your physician wants you to follow-up in: one year with Dr. Lovena Le.   You will receive a reminder letter in the mail two months in advance. If you don't receive a letter, please call our office to schedule the follow-up appointment.  Remote monitoring is used to monitor your ICD from home. This monitoring reduces the number of office visits required to check your device to one time per year. It allows [place] to keep an eye on the functioning of your device to ensure it is working properly. You are scheduled for a device check from home on 01/08/2018. You may send your transmission at any time that day. If you have a wireless device, the transmission will be sent automatically. After your physician reviews your transmission, you will receive a postcard with your next transmission date.  Any Other Special Instructions Will Be Listed Below (If Applicable).  If you need a refill on your cardiac medications before your next appointment, please call your pharmacy.

## 2018-01-08 ENCOUNTER — Ambulatory Visit (INDEPENDENT_AMBULATORY_CARE_PROVIDER_SITE_OTHER): Payer: PPO | Admitting: *Deleted

## 2018-01-08 ENCOUNTER — Telehealth: Payer: Self-pay

## 2018-01-08 DIAGNOSIS — I255 Ischemic cardiomyopathy: Secondary | ICD-10-CM

## 2018-01-08 DIAGNOSIS — R55 Syncope and collapse: Secondary | ICD-10-CM | POA: Diagnosis not present

## 2018-01-08 NOTE — Telephone Encounter (Signed)
Confirmed remote transmission w/ pt wife.   

## 2018-01-08 NOTE — Progress Notes (Signed)
Remote ICD transmission.   

## 2018-01-09 ENCOUNTER — Encounter: Payer: Self-pay | Admitting: Cardiology

## 2018-01-15 DIAGNOSIS — Z1339 Encounter for screening examination for other mental health and behavioral disorders: Secondary | ICD-10-CM | POA: Diagnosis not present

## 2018-01-15 DIAGNOSIS — Z8673 Personal history of transient ischemic attack (TIA), and cerebral infarction without residual deficits: Secondary | ICD-10-CM | POA: Diagnosis not present

## 2018-01-15 DIAGNOSIS — C9110 Chronic lymphocytic leukemia of B-cell type not having achieved remission: Secondary | ICD-10-CM | POA: Diagnosis not present

## 2018-01-15 DIAGNOSIS — E7800 Pure hypercholesterolemia, unspecified: Secondary | ICD-10-CM | POA: Diagnosis not present

## 2018-01-15 DIAGNOSIS — I10 Essential (primary) hypertension: Secondary | ICD-10-CM | POA: Diagnosis not present

## 2018-02-18 LAB — CUP PACEART REMOTE DEVICE CHECK
Battery Remaining Longevity: 68 mo
Battery Voltage: 2.99 V
Brady Statistic AP VP Percent: 15 %
Brady Statistic RA Percent Paced: 13 %
HIGH POWER IMPEDANCE MEASURED VALUE: 64 Ohm
HighPow Impedance: 64 Ohm
Implantable Lead Implant Date: 20180926
Implantable Lead Implant Date: 20180926
Implantable Lead Location: 753859
Implantable Lead Location: 753860
Implantable Lead Model: 7122
Implantable Pulse Generator Implant Date: 20180926
Lead Channel Impedance Value: 450 Ohm
Lead Channel Impedance Value: 730 Ohm
Lead Channel Pacing Threshold Amplitude: 0.75 V
Lead Channel Pacing Threshold Pulse Width: 0.5 ms
Lead Channel Sensing Intrinsic Amplitude: 11.7 mV
Lead Channel Sensing Intrinsic Amplitude: 2.4 mV
Lead Channel Setting Pacing Amplitude: 2 V
Lead Channel Setting Pacing Amplitude: 2.5 V
Lead Channel Setting Pacing Amplitude: 2.5 V
Lead Channel Setting Pacing Pulse Width: 0.8 ms
MDC IDC LEAD IMPLANT DT: 20180926
MDC IDC LEAD LOCATION: 753858
MDC IDC MSMT BATTERY REMAINING PERCENTAGE: 82 %
MDC IDC MSMT LEADCHNL LV PACING THRESHOLD AMPLITUDE: 1 V
MDC IDC MSMT LEADCHNL LV PACING THRESHOLD PULSEWIDTH: 0.8 ms
MDC IDC MSMT LEADCHNL RA IMPEDANCE VALUE: 440 Ohm
MDC IDC MSMT LEADCHNL RV PACING THRESHOLD AMPLITUDE: 0.5 V
MDC IDC MSMT LEADCHNL RV PACING THRESHOLD PULSEWIDTH: 0.5 ms
MDC IDC PG SERIAL: 9764296
MDC IDC SESS DTM: 20190925194258
MDC IDC SET LEADCHNL RV PACING PULSEWIDTH: 0.5 ms
MDC IDC SET LEADCHNL RV SENSING SENSITIVITY: 0.5 mV
MDC IDC STAT BRADY AP VS PERCENT: 1 %
MDC IDC STAT BRADY AS VP PERCENT: 81 %
MDC IDC STAT BRADY AS VS PERCENT: 2.2 %

## 2018-03-21 ENCOUNTER — Ambulatory Visit: Payer: PPO | Admitting: Sports Medicine

## 2018-03-21 ENCOUNTER — Encounter: Payer: Self-pay | Admitting: Sports Medicine

## 2018-03-21 VITALS — BP 135/80 | HR 70 | Resp 16

## 2018-03-21 DIAGNOSIS — B351 Tinea unguium: Secondary | ICD-10-CM | POA: Diagnosis not present

## 2018-03-21 DIAGNOSIS — I739 Peripheral vascular disease, unspecified: Secondary | ICD-10-CM | POA: Diagnosis not present

## 2018-03-21 DIAGNOSIS — M79675 Pain in left toe(s): Secondary | ICD-10-CM | POA: Diagnosis not present

## 2018-03-21 DIAGNOSIS — M79674 Pain in right toe(s): Secondary | ICD-10-CM

## 2018-03-21 DIAGNOSIS — M21371 Foot drop, right foot: Secondary | ICD-10-CM

## 2018-03-21 DIAGNOSIS — Z8673 Personal history of transient ischemic attack (TIA), and cerebral infarction without residual deficits: Secondary | ICD-10-CM

## 2018-03-21 NOTE — Progress Notes (Signed)
Subjective: EMMITTE SURGEON is a 84 y.o. male patient seen today in office with complaint of mildly painful thickened and elongated toenails; unable to trim.  Patient denies any changes with medical history since last visit.  Remains on Plavix for history of stroke like before.  Patient uses a brace on right foot foot drop. Patient has no other pedal complaints at this time.   Patient Active Problem List   Diagnosis Date Noted  . Near syncope 11/02/2017  . History of skin cancer 09/23/2017  . Transient speech disturbance 06/01/2017  . 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 Cerebral embolism with cerebral infarction 06/01/2017  . Polymorphic ventricular tachycardia ([place]) 01/09/2017  . NSVT (nonsustained ventricular tachycardia) ([place]) 05/01/2016  . Left bundle branch block (LBBB) on electrocardiogram 02/27/2016  . Dizziness of unknown cause 02/27/2016  . Bradycardia with 41-50 beats per minute 02/27/2016  . Ischemic cardiomyopathy: EF 40-45% reduced to ~30% - post ICD up to 45-50%. 02/27/2016  . Pancytopenia, acquired ([place]) 09/30/2015  . Thrombocytopenia ([place]) 10/01/2014  . 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 CLL (chronic lymphocytic leukemia) ([place]) 06/20/2011  . Hyperlipidemia 06/20/2011  . Essential hypertension 06/20/2011  . Borderline diabetes 06/20/2011  . CAD S/P percutaneous coronary angioplasty 02/26/1985    Current Outpatient Medications on File Prior to Visit  Medication Sig Dispense Refill  . Acetaminophen (TYLENOL 8 HOUR PO) Take by mouth.    . clopidogrel (PLAVIX) 75 MG tablet Take 1 tablet (75 mg total) by mouth daily. 30 tablet 3  . co-enzyme Q-10 30 MG capsule Take 30 mg by mouth daily.    Marland Kitchen lisinopril (PRINIVIL,ZESTRIL) 10 MG tablet Take 1 tablet (10 mg total) by mouth daily. PLEASE CONTACT OFFICE FOR ADDITIONAL REFILLS 15 tablet 0  . MEGARED OMEGA-3 KRILL OIL 500 MG CAPS Take 1 capsule by mouth daily.    . metoprolol succinate (TOPROL-XL) 25 MG  24 hr tablet TAKE ONE TABLET BY MOUTH ONCE DAILY 30 tablet 9  . Multiple Vitamin (MULTIVITAMIN) tablet Take 1 tablet by mouth daily.     No current facility-administered medications on file prior to visit.     Allergies  Allergen Reactions  . Tape Other (See Comments)    "PLASTIC" TAPE PULLS OFF THE SKIN AND THE REMOVAL OF ALL TAPES BRUISE HIM!!!    Objective: Physical Exam  General: Well developed, nourished, no acute distress, awake, alert and oriented x 3  Vascular: Dorsalis pedis artery 1/4 bilateral, Posterior tibial artery 1/4 bilateral, skin temperature warm to warm proximal to distal bilateral lower extremities, ++ varicosities, pedal hair present bilateral.  Neurological: Gross sensation present via light touch bilateral.   Dermatological: Skin is warm, dry, and supple bilateral, Nails 1-10 are tender, long, thick, and discolored with mild subungal debris and old dry blood that is much improved from prior, no webspace macerations present bilateral, no open lesions present bilateral, no callus/corns/hyperkeratotic tissue present bilateral. No signs of infection bilateral.  Musculoskeletal: + foot drop and weakness on right.  Assessment and Plan:  Problem List Items Addressed This Visit    None    Visit Diagnoses    Pain due to onychomycosis of toenails of both feet    -  Primary   PVD (peripheral vascular disease) ([place])       Foot drop, right       History of stroke         -Examined patient.  -Re-Discussed treatment options for painful mycotic nails. -Mechanically debrided and reduced  mycotic nails with sterile nail nipper and dremel nail file without incident. -Continue with AFO on right and good supportive shoes like previous for drop foot  -Patient to return in 3 months for follow up evaluation or sooner if symptoms worsen.  Landis Martins, DPM

## 2018-04-10 ENCOUNTER — Ambulatory Visit (INDEPENDENT_AMBULATORY_CARE_PROVIDER_SITE_OTHER): Payer: PPO

## 2018-04-10 DIAGNOSIS — I255 Ischemic cardiomyopathy: Secondary | ICD-10-CM

## 2018-04-10 LAB — CUP PACEART REMOTE DEVICE CHECK
Battery Voltage: 2.98 V
Brady Statistic AP VS Percent: 1 %
Brady Statistic AS VP Percent: 70 %
Brady Statistic RA Percent Paced: 25 %
Date Time Interrogation Session: 20191225090016
HighPow Impedance: 63 Ohm
HighPow Impedance: 63 Ohm
Implantable Lead Implant Date: 20180926
Implantable Lead Location: 753858
Implantable Lead Location: 753860
Implantable Lead Model: 7122
Lead Channel Impedance Value: 410 Ohm
Lead Channel Impedance Value: 700 Ohm
Lead Channel Pacing Threshold Amplitude: 0.5 V
Lead Channel Pacing Threshold Amplitude: 0.75 V
Lead Channel Pacing Threshold Amplitude: 1 V
Lead Channel Pacing Threshold Pulse Width: 0.5 ms
Lead Channel Sensing Intrinsic Amplitude: 11.7 mV
Lead Channel Setting Pacing Amplitude: 2 V
Lead Channel Setting Pacing Pulse Width: 0.5 ms
Lead Channel Setting Pacing Pulse Width: 0.8 ms
MDC IDC LEAD IMPLANT DT: 20180926
MDC IDC LEAD IMPLANT DT: 20180926
MDC IDC LEAD LOCATION: 753859
MDC IDC MSMT BATTERY REMAINING LONGEVITY: 65 mo
MDC IDC MSMT BATTERY REMAINING PERCENTAGE: 78 %
MDC IDC MSMT LEADCHNL LV PACING THRESHOLD PULSEWIDTH: 0.8 ms
MDC IDC MSMT LEADCHNL RA SENSING INTR AMPL: 2.6 mV
MDC IDC MSMT LEADCHNL RV IMPEDANCE VALUE: 410 Ohm
MDC IDC MSMT LEADCHNL RV PACING THRESHOLD PULSEWIDTH: 0.5 ms
MDC IDC PG IMPLANT DT: 20180926
MDC IDC SET LEADCHNL LV PACING AMPLITUDE: 2.5 V
MDC IDC SET LEADCHNL RV PACING AMPLITUDE: 2.5 V
MDC IDC SET LEADCHNL RV SENSING SENSITIVITY: 0.5 mV
MDC IDC STAT BRADY AP VP PERCENT: 26 %
MDC IDC STAT BRADY AS VS PERCENT: 1.8 %
Pulse Gen Serial Number: 9764296

## 2018-04-10 NOTE — Progress Notes (Signed)
Remote ICD transmission.   

## 2018-04-23 DIAGNOSIS — Z1331 Encounter for screening for depression: Secondary | ICD-10-CM | POA: Diagnosis not present

## 2018-04-23 DIAGNOSIS — H6123 Impacted cerumen, bilateral: Secondary | ICD-10-CM | POA: Diagnosis not present

## 2018-04-23 DIAGNOSIS — C9110 Chronic lymphocytic leukemia of B-cell type not having achieved remission: Secondary | ICD-10-CM | POA: Diagnosis not present

## 2018-04-23 DIAGNOSIS — I10 Essential (primary) hypertension: Secondary | ICD-10-CM | POA: Diagnosis not present

## 2018-05-13 DIAGNOSIS — R410 Disorientation, unspecified: Secondary | ICD-10-CM | POA: Diagnosis not present

## 2018-05-15 DIAGNOSIS — R410 Disorientation, unspecified: Secondary | ICD-10-CM | POA: Diagnosis not present

## 2018-05-15 DIAGNOSIS — G9389 Other specified disorders of brain: Secondary | ICD-10-CM | POA: Diagnosis not present

## 2018-05-15 DIAGNOSIS — G319 Degenerative disease of nervous system, unspecified: Secondary | ICD-10-CM | POA: Diagnosis not present

## 2018-05-30 ENCOUNTER — Ambulatory Visit: Payer: PPO | Admitting: Adult Health

## 2018-06-02 ENCOUNTER — Ambulatory Visit (INDEPENDENT_AMBULATORY_CARE_PROVIDER_SITE_OTHER): Payer: PPO | Admitting: Adult Health

## 2018-06-02 ENCOUNTER — Encounter: Payer: Self-pay | Admitting: Adult Health

## 2018-06-02 VITALS — BP 146/56 | HR 53 | Ht 69.0 in | Wt 180.0 lb

## 2018-06-02 DIAGNOSIS — E785 Hyperlipidemia, unspecified: Secondary | ICD-10-CM | POA: Diagnosis not present

## 2018-06-02 DIAGNOSIS — I10 Essential (primary) hypertension: Secondary | ICD-10-CM | POA: Diagnosis not present

## 2018-06-02 DIAGNOSIS — R410 Disorientation, unspecified: Secondary | ICD-10-CM

## 2018-06-02 DIAGNOSIS — I63312 Cerebral infarction due to thrombosis of left middle cerebral artery: Secondary | ICD-10-CM | POA: Diagnosis not present

## 2018-06-02 DIAGNOSIS — R29818 Other symptoms and signs involving the nervous system: Secondary | ICD-10-CM | POA: Diagnosis not present

## 2018-06-02 NOTE — Progress Notes (Signed)
[name] [address] [place]. [name] [address] [phone]       OFFICE FOLLOW UP NOTE  [name] Date of Birth:  19-Sep-1927 Medical Record Number:  [id number]   Reason for Referral:  stroke follow up  CHIEF COMPLAINT:  Chief Complaint  Patient presents with  . Follow-up    Stroke follow up room in back hallway with wife and daughter and Ramona  Pt had a Ct scan two weeks ago because of confusion order by PCP wants to to discuss namenda and pravastatin pt had a fall    HPI: Gregory Rodgers is being seen today in the office for left MCA stroke on 06/01/17. History obtained from patient and chart review. Reviewed all radiology images and labs personally.  [name] is a 85 y.o. male with history of coronary artery disease with previous MI, left bundle branch block, borderline diabetes, hyperlipidemia, history of renal calculi, hypertension, chronic lymphocytic leukemia, CHF, cardiomyopathy with symptomatic nonsustained V. tach, and status post placement of a biventricular pacemaker/AICD presenting with transient speech problems, confusion, transient visual loss, and right lower extremity weakness. He did not receive IV t-PA due to late presentation.  MRI brain reviewed and showed multiple left MCA territory infarcts.  MRA head/neck showed nonocclusive filling defect in the left carotid bulb compatible with thrombus.  2D echo showed EF of 40-55% but no cardiac source of emboli was identified.  TEE did show a large PFO. LDL 81 and A1c 6.0.  Patient was previously on aspirin 81 mg PTA and was recommended to take aspirin 81 mg and Plavix 75 mg daily..  Recommended for patient to start Lipitor 40 mg daily.  Due to left ICA thrombus, he was started on IV heparin in the hospital but switch to aspirin and Plavix at discharge by Dr. Estanislado Pandy.  Patient discharged home with PT and speech therapy.   07/23/17 visit: Since discharge, patient has been doing well and  is accompanied today's visit by wife and daughter.  He continues to do physical therapy where he does have 1 more session but will continue speech therapy for 2 more weeks.  He did fall recently and fell on right arm and was seen at an urgent care.   he was complaining of right shoulder pain/right arm pain but this has been improving over time.  Blood pressure slightly elevated at 151/60 but they do check blood pressure at home and typically runs SBP 130-140.  Wife is requesting orthotic  for leg length discrepancy as recommended by PT.  Patient's PCP recently closed down and is undergoing process of finding a new PCP.  No further complaints at this time.  No new or worsening stroke/TIA symptoms.  Interval history 11/26/17: Patient is being seen today for stroke follow-up and is accompanied by his wife and daughter.  He continues to have some complaints of dysarthria but states this does wax and wane.  Unable to correlate worsening of speech due to fatigue or stress.  He continues to do speech exercises at home on his own.  He continues Plavix with bruising but no bleeding.  Lipitor was stopped by PCP after patient was complaining of abdominal pain and was found to have increased liver function panel.  He was started on pravastatin at that time and due to continued myalgias it was recommended to take pravastatin 10 mg 3 times weekly.  At today's appointment, he was continuing to complain of shoulder pain and abdominal pain.  Unable to see PCP notes or lab work that was obtained by PCP, but wife and daughter both stated that LFTs normalized after stopping of Lipitor.  Recommended to stop pravastatin for possible side effect of myalgias.  If myalgias resolve, recommended to have repeat lab work at follow-up appointment with PCP in October and continue close follow-up with lipid panel.  If myalgias do not resolve, recommended to restart pravastatin and follow-up with PCP as myalgias may be due to to underlying  condition.  Blood pressure today mildly elevated at 142/60 and wife states she occasionally check this at home and is typically SBP 130s.  Denies new or worsening stroke/TIA symptoms. On a side note, wife states that he has occasional vertigo which she has had for 20+ years.  Patient is unsure if this vertigo occurs with position change or rapid head movement.  Wife believes that this is when he complains of increased dizziness sensation after he bends down to pick something up or with position change.  As the symptoms are only on occasion, recommended to avoid rapid position changes or head movement.  Advised that if these become more frequent or intolerable, patient can try PT for possible BPPV.   Interval history 06/02/2018: [name] is being seen today for follow-up visit and is accompanied by his wife and daughter.  Wife has concerns of 2-week history of transient confusion episodes such as when he was driving to his son's house with his wife, he asked his wife if she had ever been down that road before and at times will talk to his wife without acknowledging who she is.  She denies any prior memory concerns or confusion states except for when he was admitted for a stroke 1 year prior.  He continues to have residual deficits of expressive aphasia but this has been stable without any worsening.  He did have CT head obtained by PCP on 05/15/2018 which was negative for acute abnormalities.  Namenda was initiated and wife is questioning whether this needs to be continued.  These episodes will last for a few minutes and patient does not appear to remember when these confusional states occur.  Denies loss of consciousness or headaches at this time or any other neurological deficit.  Besides his episodes occurring, he has been stable and continues to maintain ADLs and some IADLs independently.  He has continued on Plavix without side effects of bleeding or bruising.  His PCP recently restarted him on pravastatin for  HDL management.  This was discontinued after prior visit due to possible statin myalgias and per wife, after repeating lipid panel, pravastatin was reinitiated at lower dose.  Blood pressure slightly elevated 146/53 but continues to be monitored at home and typically 130s/80s.  Loop recorder has not shown atrial fibrillation thus far.  No further concerns at this time.     ROS:   14 system review of systems performed and negative with exception of see HPI   PMH:  Past Medical History:  Diagnosis Date  . Basal cell carcinoma    "numerous places"  . Borderline diabetes   . Cardiomyopathy, dilated ([place]) 03/2016   a) EF ~30% with diffuse HK (not simply ICM) ? partially due to LBBB (septal-lateral dyssynchrony). b) f/u Echo after BiV ICD = EF 45-50%  . CLL (chronic lymphocytic leukemia) ([place]) 09/2009   stage 0; on observation  . Coronary artery disease involving native heart without angina pectoris    PTCA of RCA in setting of an MI  in 1986-87; no follow-up study since available.  . Essential hypertension   . History of kidney stones   . Hyperlipidemia with target LDL less than 70    With known CAD  . 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 Left bundle branch block (LBBB) on electrocardiogram    Noted as far back as 2008.  . Old inferior wall myocardial infarction 1986/87   Had PTCA most likely of the RCA    PSH:  Past Surgical History:  Procedure Laterality Date  . BASAL CELL CARCINOMA EXCISION     "numerous"  . BIV ICD INSERTION CRT-D N/A 01/09/2017   Procedure: BIV ICD INSERTION CRT-D;  Surgeon: Evans Lance, MD;  Location: [facility];  Service: Cardiovascular;  Laterality: N/A;  . CATARACT EXTRACTION W/ INTRAOCULAR LENS  IMPLANT, BILATERAL Bilateral   . CORONARY ANGIOPLASTY  1986/1987  . defibllator    . dual pacemaker    . EP IMPLANTABLE DEVICE N/A 05/10/2016   Procedure: Loop Recorder Insertion;  Surgeon: Evans Lance, MD;   Location: [facility];  Service: Cardiovascular;  Laterality: N/A;  slight blood drop on dressing  . JOINT REPLACEMENT    . MOHS SURGERY Left 01/02/2017   ear  . REPLACEMENT TOTAL KNEE Left   . TEE WITHOUT CARDIOVERSION N/A 06/03/2017   Procedure: TRANSESOPHAGEAL ECHOCARDIOGRAM (TEE) - cancelled -   . TRANSTHORACIC ECHOCARDIOGRAM  03/2016   Normal LV size with mild LV hypertrophy. EF 30%. Diffuse  hypokinesis wtih septal-lateral dyssynchrony. Normal RV size and systolic function. Mild MR and mild AI.  Marland Kitchen TRANSTHORACIC ECHOCARDIOGRAM  06/03/2017   for CVA evaluation.  Diffuse hypokinesis EF 45 to 50% with moderate focal basal septal hypertrophy.  Moderate aortic insufficiency.  Large PFO with positive bubble study. -Possible source of embolus.  Ascending aortic aneurysm unchanged.    Social History:  Social History   Socioeconomic History  . Marital status: Married    Spouse name: Not on file  . Number of children: Not on file  . Years of education: Not on file  . Highest education level: Not on file  Occupational History  . Not on file  Social Needs  . Financial resource strain: Not on file  . Food insecurity:    Worry: Not on file    Inability: Not on file  . Transportation needs:    Medical: Not on file    Non-medical: Not on file  Tobacco Use  . Smoking status: Never Smoker  . Smokeless tobacco: Never Used  Substance and Sexual Activity  . Alcohol use: No  . Drug use: No  . Sexual activity: Not on file  Lifestyle  . Physical activity:    Days per week: Not on file    Minutes per session: Not on file  . Stress: Not on file  Relationships  . Social connections:    Talks on phone: Not on file    Gets together: Not on file    Attends religious service: Not on file    Active member of club or organization: Not on file    Attends meetings of clubs or organizations: Not on file    Relationship status: Not on file  . Intimate partner violence:    Fear of current  or ex partner: Not on file    Emotionally abused: Not on file    Physically abused: Not on file    Forced sexual  activity: Not on file  Other Topics Concern  . Not on file  Social History Narrative  . Not on file    Family History:  Family History  Problem Relation Age of Onset  . Sudden Cardiac Death Neg Hx     Medications:   Current Outpatient Medications on File Prior to Visit  Medication Sig Dispense Refill  . Acetaminophen (TYLENOL 8 HOUR PO) Take by mouth.    . clopidogrel (PLAVIX) 75 MG tablet Take 1 tablet (75 mg total) by mouth daily. 30 tablet 3  . co-enzyme Q-10 30 MG capsule Take 30 mg by mouth daily.    Marland Kitchen lisinopril (PRINIVIL,ZESTRIL) 10 MG tablet Take 1 tablet (10 mg total) by mouth daily. PLEASE CONTACT OFFICE FOR ADDITIONAL REFILLS 15 tablet 0  . MEGARED OMEGA-3 KRILL OIL 500 MG CAPS Take 1 capsule by mouth daily.    . metoprolol succinate (TOPROL-XL) 25 MG 24 hr tablet TAKE ONE TABLET BY MOUTH ONCE DAILY 30 tablet 9  . Multiple Vitamin (MULTIVITAMIN) tablet Take 1 tablet by mouth daily.     No current facility-administered medications on file prior to visit.     Allergies:   Allergies  Allergen Reactions  . Tape Other (See Comments)    "PLASTIC" TAPE PULLS OFF THE SKIN AND THE REMOVAL OF ALL TAPES BRUISE HIM!!!     Physical Exam  Vitals:   06/02/18 1521  BP: (!) 146/56  Pulse: (!) 53  Weight: 180 lb (81.6 kg)  Height: 5\' 9"  (1.753 m)   Body mass index is 26.58 kg/m. No exam data present  General: well developed, pleasant elderly Caucasian male, well nourished, seated, in no evident distress Head: head normocephalic and atraumatic.   Neck: supple with no carotid or supraclavicular bruits Cardiovascular: regular rate and rhythm, no murmurs Musculoskeletal: no deformity Skin:  no rash/petichiae Vascular:  Normal pulses all extremities  Neurologic Exam Mental Status: Awake and fully alert.  Mild expressive aphasia.  Oriented to place and time.  Recent and remote memory intact. Attention span, concentration and fund of knowledge appropriate. Mood and affect appropriate.  Unable to appreciate disorientation, confusion or memory loss during today's visit Cranial Nerves: Fundoscopic exam reveals sharp disc margins. Pupils equal, briskly reactive to light. Extraocular movements full without nystagmus. Visual fields full to confrontation.  HOH bilaterally. Facial sensation intact. Face, tongue, palate moves normally and symmetrically.  Motor: Normal bulk and tone. Normal strength in all tested extremity muscles. Sensory.: intact to touch , pinprick , position and vibratory sensation.  Coordination: Rapid alternating movements normal in all extremities. Finger-to-nose and heel-to-shin performed accurately bilaterally. Gait and Station: Arises from chair without difficulty. Stance is normal. Gait demonstrates normal stride length and balance . Able to heel, toe and tandem walk without difficulty.  Reflexes: 1+ and symmetric. Toes downgoing.     Diagnostic Data (Labs, Imaging, Testing)  CT head 05/15/2018 IMPRESSION: 1. No acute finding or change from February 2019. 2. Brain atrophy and remote small vessel infarcts. 3. Vertebrobasilar dolichoectasia.    Mr [name] Neck W Wo Contrast Mr [name] Wo Contrast 06/01/2017   IMPRESSION:  Multiple small acute infarcts in the left MCA territory.  Irregular plaque proximal left internal carotid artery with 60% diameter narrowing.  Filling defect in the left carotid bulb compatible with thrombus.  This is likely a cause of embolic infarctions in the left MCA territory.  Atrophy and chronic microvascular ischemia.  No intracranial large vessel occlusion.    Transthoracic Echocardiogram  06/01/2017 Study Conclusions - Left ventricle: The cavity size was normal. There was severe focal basal hypertrophy of the septum with otherwise mild concentric hypertrophy. Systolic function was mildly  to moderately reduced. The estimated ejection fraction was in the range of 40% to 45%. Wall motion was normal; there were no regional wall motion abnormalities. Doppler parameters are consistent with abnormal left ventricular relaxation (grade 1 diastolic dysfunction). - Aortic valve: Transvalvular velocity was within the normal range. There was no stenosis. There was moderate regurgitation. Regurgitation pressure half-time: 348 ms. - Aorta: Ascending aortic diameter: 42 mm (S). - Ascending aorta: The ascending aorta was mildly dilated. - Mitral valve: Transvalvular velocity was within the normal range. There was no evidence for stenosis. There was mild regurgitation. - Left atrium: The atrium was mildly dilated. - Right ventricle: The cavity size was normal. Wall thickness was normal. Systolic function was normal. - Tricuspid valve: There was trivial regurgitation. Impressions: - Compared with the prior echo 17/5102, systolic function has improved. The ascending aortic aneurysm is unchanged.  Echo TEE Result date: 06/04/2017 Study Conclusions  is- Left ventricle: Normal LV size with moderate focal basal septal hypertrophy. EF 45-50%, diffuse hypokinesis. - Aortic valve: There was no stenosis. There was moderate regurgitation. - Aorta: Normal caliber but tortuous thoracic aorta with grade III plaque in the descending thoracic aorta. - Mitral valve: There was trivial regurgitation. - Left atrium: The atrium was mildly dilated. No evidence of thrombus in the atrial cavity or appendage. - Right ventricle: The cavity size was normal. Pacer wire or catheter noted in right ventricle. Systolic function was normal. - Right atrium: No evidence of thrombus in the atrial cavity or appendage. - Atrial septum: There was a large PFO, bubble study markedly positive. Impressions: - PFO is possible source of embolus. Left MCA   ASSESSMENT: Gregory Rodgers  is a 85 y.o. year old male here with stroke on 06/01/2017 secondary to left ICA thrombus. Vascular risk factors include HTN, HLD and CAD.  Residual deficits of expressive aphasia.  He is being seen today for follow-up visit with recent concerns of transient confusion episodes.   PLAN: -MRI head to ensure no acute infarct as possibility of new infarct not being seen on CT head.  Also recommend carotid ultrasound depending on imaging results and if all negative can consider EEG to rule out seizure activity For MRI:  pacemaker placed in September 2018.St. Jude (serial # [id number] ) right atrial lead and a St. Jude (serial number [id number]) right ventricular defibrillator lead St. Jude(serial number [id number] St. Jude (serial Number [id number]) biventricular ICDplaced after he had symptomatic NSVT. -Patient questioning use of Namenda -as confusion episodes intermittent and does not appear to have MCI or dementia, no clear indication for Namenda at this time -Continue clopidogrel 75 mg daily  and pravastatin for secondary stroke prevention -F/u with PCP regarding your HLD, HTN and CAD management -Continue to monitor loop recorder for atrial fibrillation -continue to monitor BP at home -Advised to continue to stay active and maintain a healthy diet -Maintain strict control of hypertension with blood pressure goal below 130/90, diabetes with hemoglobin A1c goal below 6.5% and cholesterol with LDL cholesterol (bad cholesterol) goal below 70 mg/dL. I also advised the patient to eat a healthy diet with plenty of whole grains, cereals, fruits and vegetables, exercise regularly and maintain ideal body weight.  Follow up in 6 months or call earlier if needed  Greater than 50% time during this 25 minute consultation visit  was spent on counseling and coordination of care about HLD, and HTN, discussion about risk benefit of anticoagulation and answering questions.    Venancio Poisson,  AGNP-BC  [facility] [address] [place], [place] [address]  Phone [phone] Fax [phone]

## 2018-06-02 NOTE — Patient Instructions (Addendum)
Continue clopidogrel 75 mg daily  and Pravastatin  for secondary stroke prevention  Continue to follow up with PCP regarding cholesterol and blood pressure management   We will check MRI head to ensure no new stroke and ultrasound of your carotid arteries. We will also check EEG if no new stroke to ensure no seizure activity  Continue to monitor blood pressure at home  Maintain strict control of hypertension with blood pressure goal below 130/90, diabetes with hemoglobin A1c goal below 6.5% and cholesterol with LDL cholesterol (bad cholesterol) goal below 70 mg/dL. I also advised the patient to eat a healthy diet with plenty of whole grains, cereals, fruits and vegetables, exercise regularly and maintain ideal body weight.  Followup in the future with me in 6 months or call earlier if needed       Thank you for coming to see [place] at [facility]. I hope we have been able to provide you high quality care today.  You may receive a patient satisfaction survey over the next few weeks. We would appreciate your feedback and comments so that we may continue to improve ourselves and the health of our patients.

## 2018-06-03 ENCOUNTER — Telehealth: Payer: Self-pay | Admitting: Adult Health

## 2018-06-03 NOTE — Progress Notes (Signed)
I agree with the above plan 

## 2018-06-03 NOTE — Telephone Encounter (Signed)
Health team no auth.

## 2018-06-03 NOTE — Telephone Encounter (Signed)
For MRI:  pacemakerplaced in September 2018.St. Jude (serial # [id number] )right atrial lead and a St. Jude (serial number [id number]) right ventricular defibrillator lead St. Jude(serial number [id number] St. Jude (serial Number [id number] ICDplaced after he had symptomatic NSVT.  Faxed the order to [facility] once they get it approved they will reach out to the patient to schedule.

## 2018-06-04 NOTE — Telephone Encounter (Signed)
Thank you for the update!

## 2018-06-04 NOTE — Telephone Encounter (Signed)
Noted, Gregory Rodgers you will have to cancel the MRI.

## 2018-06-04 NOTE — Telephone Encounter (Signed)
Pt's daughter said he did not want to pursue with MRI. Please cancel

## 2018-06-04 NOTE — Telephone Encounter (Signed)
Also, [place] from [facility] called me and informed me his pacemaker is not MRI safe. He did have a MRI back in the beginning of 2019 but he was not suppose to have that then and if he has another MRI it will start causing damage.

## 2018-06-20 ENCOUNTER — Ambulatory Visit (INDEPENDENT_AMBULATORY_CARE_PROVIDER_SITE_OTHER): Payer: PPO | Admitting: Sports Medicine

## 2018-06-20 ENCOUNTER — Encounter: Payer: Self-pay | Admitting: Sports Medicine

## 2018-06-20 VITALS — BP 153/65 | HR 58 | Resp 16

## 2018-06-20 DIAGNOSIS — Z8673 Personal history of transient ischemic attack (TIA), and cerebral infarction without residual deficits: Secondary | ICD-10-CM

## 2018-06-20 DIAGNOSIS — M79674 Pain in right toe(s): Secondary | ICD-10-CM

## 2018-06-20 DIAGNOSIS — M79675 Pain in left toe(s): Secondary | ICD-10-CM

## 2018-06-20 DIAGNOSIS — B351 Tinea unguium: Secondary | ICD-10-CM | POA: Diagnosis not present

## 2018-06-20 DIAGNOSIS — I739 Peripheral vascular disease, unspecified: Secondary | ICD-10-CM

## 2018-06-20 DIAGNOSIS — M21371 Foot drop, right foot: Secondary | ICD-10-CM

## 2018-06-20 NOTE — Progress Notes (Signed)
Subjective: Gregory Rodgers is a 85 y.o. male patient seen today in office with complaint of mildly painful thickened and elongated toenails; unable to trim.  Patient denies any changes with medical history since last visit.  Denies any new issues, remains on Plavix for history of stroke like before and uses brace on right foot foot drop. Patient has no other pedal complaints at this time.   Patient Active Problem List   Diagnosis Date Noted  . Near syncope 11/02/2017  . History of skin cancer 09/23/2017  . Transient speech disturbance 06/01/2017  . 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 Cerebral embolism with cerebral infarction 06/01/2017  . Polymorphic ventricular tachycardia ([place]) 01/09/2017  . NSVT (nonsustained ventricular tachycardia) ([place]) 05/01/2016  . Left bundle branch block (LBBB) on electrocardiogram 02/27/2016  . Dizziness of unknown cause 02/27/2016  . Bradycardia with 41-50 beats per minute 02/27/2016  . Ischemic cardiomyopathy: EF 40-45% reduced to ~30% - post ICD up to 45-50%. 02/27/2016  . Pancytopenia, acquired ([place]) 09/30/2015  . Thrombocytopenia ([place]) 10/01/2014  . 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 CLL (chronic lymphocytic leukemia) ([place]) 06/20/2011  . Hyperlipidemia 06/20/2011  . Essential hypertension 06/20/2011  . Borderline diabetes 06/20/2011  . CAD S/P percutaneous coronary angioplasty 02/26/1985    Current Outpatient Medications on File Prior to Visit  Medication Sig Dispense Refill  . Acetaminophen (TYLENOL 8 HOUR PO) Take by mouth.    . clopidogrel (PLAVIX) 75 MG tablet Take 1 tablet (75 mg total) by mouth daily. 30 tablet 3  . co-enzyme Q-10 30 MG capsule Take 30 mg by mouth daily.    Marland Kitchen lisinopril (PRINIVIL,ZESTRIL) 10 MG tablet Take 1 tablet (10 mg total) by mouth daily. PLEASE CONTACT OFFICE FOR ADDITIONAL REFILLS 15 tablet 0  . MEGARED OMEGA-3 KRILL OIL 500 MG CAPS Take 1 capsule by mouth daily.    . metoprolol succinate  (TOPROL-XL) 25 MG 24 hr tablet TAKE ONE TABLET BY MOUTH ONCE DAILY 30 tablet 9  . Multiple Vitamin (MULTIVITAMIN) tablet Take 1 tablet by mouth daily.    . pravastatin (PRAVACHOL) 10 MG tablet Take 10 mg by mouth daily.     No current facility-administered medications on file prior to visit.     Allergies  Allergen Reactions  . Tape Other (See Comments)    "PLASTIC" TAPE PULLS OFF THE SKIN AND THE REMOVAL OF ALL TAPES BRUISE HIM!!!    Objective: Physical Exam  General: Well developed, nourished, no acute distress, awake, alert and oriented x 3  Vascular: Dorsalis pedis artery 1/4 bilateral, Posterior tibial artery 1/4 bilateral, skin temperature warm to warm proximal to distal bilateral lower extremities, ++ varicosities, pedal hair present bilateral.  Neurological: Gross sensation present via light touch bilateral.   Dermatological: Skin is warm, dry, and supple bilateral, Nails 1-10 are tender, long, thick, and discolored with mild subungal debris and old dry blood that is much improved from prior, no webspace macerations present bilateral, no open lesions present bilateral, no callus/corns/hyperkeratotic tissue present bilateral. No signs of infection bilateral.  Musculoskeletal: + foot drop and weakness on right.  Assessment and Plan:  Problem List Items Addressed This Visit    None    Visit Diagnoses    Pain due to onychomycosis of toenails of both feet    -  Primary   PVD (peripheral vascular disease) ([place])       Foot drop, right       History of stroke         -  Examined patient.  -Re-Discussed treatment options for painful mycotic nails. -Mechanically debrided and reduced mycotic nails with sterile nail nipper and dremel nail file without incident. -Continue with AFO on right and good supportive shoes like before for dropfoot -Patient to return in 3 months for follow up evaluation or sooner if symptoms worsen.  Landis Martins, DPM

## 2018-07-10 ENCOUNTER — Ambulatory Visit (INDEPENDENT_AMBULATORY_CARE_PROVIDER_SITE_OTHER): Payer: PPO | Admitting: *Deleted

## 2018-07-10 ENCOUNTER — Other Ambulatory Visit: Payer: Self-pay

## 2018-07-10 DIAGNOSIS — I255 Ischemic cardiomyopathy: Secondary | ICD-10-CM

## 2018-07-10 DIAGNOSIS — Z95810 Presence of automatic (implantable) cardiac defibrillator: Secondary | ICD-10-CM

## 2018-07-10 LAB — CUP PACEART REMOTE DEVICE CHECK
Battery Remaining Longevity: 63 mo
Battery Remaining Percentage: 75 %
Battery Voltage: 2.98 V
Brady Statistic AP VP Percent: 26 %
Brady Statistic AP VS Percent: 1 %
Brady Statistic AS VP Percent: 71 %
Brady Statistic RA Percent Paced: 24 %
Date Time Interrogation Session: 20200325091241
HighPow Impedance: 68 Ohm
HighPow Impedance: 68 Ohm
Implantable Lead Implant Date: 20180926
Implantable Lead Implant Date: 20180926
Implantable Lead Implant Date: 20180926
Implantable Lead Location: 753858
Implantable Lead Location: 753860
Implantable Lead Model: 7122
Implantable Pulse Generator Implant Date: 20180926
Lead Channel Impedance Value: 450 Ohm
Lead Channel Impedance Value: 450 Ohm
Lead Channel Impedance Value: 690 Ohm
Lead Channel Pacing Threshold Amplitude: 0.5 V
Lead Channel Pacing Threshold Amplitude: 0.75 V
Lead Channel Pacing Threshold Amplitude: 1 V
Lead Channel Pacing Threshold Pulse Width: 0.5 ms
Lead Channel Pacing Threshold Pulse Width: 0.5 ms
Lead Channel Pacing Threshold Pulse Width: 0.8 ms
Lead Channel Sensing Intrinsic Amplitude: 11.7 mV
Lead Channel Setting Pacing Amplitude: 2 V
Lead Channel Setting Pacing Amplitude: 2.5 V
Lead Channel Setting Pacing Amplitude: 2.5 V
Lead Channel Setting Pacing Pulse Width: 0.5 ms
Lead Channel Setting Pacing Pulse Width: 0.8 ms
Lead Channel Setting Sensing Sensitivity: 0.5 mV
MDC IDC LEAD LOCATION: 753859
MDC IDC MSMT LEADCHNL RA SENSING INTR AMPL: 1.7 mV
MDC IDC STAT BRADY AS VS PERCENT: 1.5 %
Pulse Gen Serial Number: 9764296

## 2018-07-15 ENCOUNTER — Encounter: Payer: Self-pay | Admitting: Cardiology

## 2018-07-15 NOTE — Progress Notes (Signed)
Remote ICD transmission.   

## 2018-07-17 ENCOUNTER — Ambulatory Visit: Payer: Self-pay | Admitting: Adult Health

## 2018-09-11 DIAGNOSIS — H6120 Impacted cerumen, unspecified ear: Secondary | ICD-10-CM | POA: Diagnosis not present

## 2018-09-11 DIAGNOSIS — C9110 Chronic lymphocytic leukemia of B-cell type not having achieved remission: Secondary | ICD-10-CM | POA: Diagnosis not present

## 2018-09-13 DIAGNOSIS — B351 Tinea unguium: Secondary | ICD-10-CM | POA: Diagnosis not present

## 2018-09-13 DIAGNOSIS — L570 Actinic keratosis: Secondary | ICD-10-CM | POA: Diagnosis not present

## 2018-09-16 ENCOUNTER — Telehealth: Payer: Self-pay

## 2018-09-16 NOTE — Telephone Encounter (Signed)
Spoke with dtr, Ramona, and moved appts to 12/16/18.

## 2018-09-16 NOTE — Telephone Encounter (Signed)
-----   Message from Heath Lark, MD sent at 09/16/2018  7:21 AM EDT ----- Yes, I suggest delay for 3 months If they agree, proceed to cancel and send new scheduling appt ----- Message ----- From: Paulla Dolly, RN Sent: 09/15/2018   3:36 PM EDT To: Heath Lark, MD  Spouse wants to know if appt can be cancelled and moved to a later date she and dtr feel he has to have one of them with him d/t his speech / communication deficits from multiple strokes. Please advise.

## 2018-09-19 ENCOUNTER — Ambulatory Visit: Payer: PPO | Admitting: Sports Medicine

## 2018-09-19 ENCOUNTER — Other Ambulatory Visit: Payer: Self-pay

## 2018-09-19 ENCOUNTER — Encounter: Payer: Self-pay | Admitting: Sports Medicine

## 2018-09-19 VITALS — Temp 97.2°F

## 2018-09-19 DIAGNOSIS — M21371 Foot drop, right foot: Secondary | ICD-10-CM | POA: Diagnosis not present

## 2018-09-19 DIAGNOSIS — B351 Tinea unguium: Secondary | ICD-10-CM

## 2018-09-19 DIAGNOSIS — M79674 Pain in right toe(s): Secondary | ICD-10-CM

## 2018-09-19 DIAGNOSIS — I739 Peripheral vascular disease, unspecified: Secondary | ICD-10-CM

## 2018-09-19 DIAGNOSIS — M79675 Pain in left toe(s): Secondary | ICD-10-CM | POA: Diagnosis not present

## 2018-09-19 DIAGNOSIS — Z8673 Personal history of transient ischemic attack (TIA), and cerebral infarction without residual deficits: Secondary | ICD-10-CM | POA: Diagnosis not present

## 2018-09-19 NOTE — Progress Notes (Signed)
Subjective: Gregory Rodgers is a 85 y.o. male patient seen today in office with complaint of mildly painful thickened and elongated toenails; unable to trim.  Patient denies any changes with medical history since last visit.  Denies any new issues, remains on Plavix for history of stroke like before and uses brace on right foot foot drop, patient is assisted by wife who helps to report this history. Patient has no other pedal complaints at this time.   Patient Active Problem List   Diagnosis Date Noted  . Near syncope 11/02/2017  . History of skin cancer 09/23/2017  . Transient speech disturbance 06/01/2017  . 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 Cerebral embolism with cerebral infarction 06/01/2017  . Polymorphic ventricular tachycardia ([place]) 01/09/2017  . NSVT (nonsustained ventricular tachycardia) ([place]) 05/01/2016  . Left bundle branch block (LBBB) on electrocardiogram 02/27/2016  . Dizziness of unknown cause 02/27/2016  . Bradycardia with 41-50 beats per minute 02/27/2016  . Ischemic cardiomyopathy: EF 40-45% reduced to ~30% - post ICD up to 45-50%. 02/27/2016  . Pancytopenia, acquired ([place]) 09/30/2015  . Thrombocytopenia ([place]) 10/01/2014  . 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 CLL (chronic lymphocytic leukemia) ([place]) 06/20/2011  . Hyperlipidemia 06/20/2011  . Essential hypertension 06/20/2011  . Borderline diabetes 06/20/2011  . CAD S/P percutaneous coronary angioplasty 02/26/1985    Current Outpatient Medications on File Prior to Visit  Medication Sig Dispense Refill  . Acetaminophen (TYLENOL 8 HOUR PO) Take by mouth.    . clopidogrel (PLAVIX) 75 MG tablet Take 1 tablet (75 mg total) by mouth daily. 30 tablet 3  . co-enzyme Q-10 30 MG capsule Take 30 mg by mouth daily.    Marland Kitchen lisinopril (PRINIVIL,ZESTRIL) 10 MG tablet Take 1 tablet (10 mg total) by mouth daily. PLEASE CONTACT OFFICE FOR ADDITIONAL REFILLS 15 tablet 0  . MEGARED OMEGA-3 KRILL OIL 500 MG CAPS  Take 1 capsule by mouth daily.    . metoprolol succinate (TOPROL-XL) 25 MG 24 hr tablet TAKE ONE TABLET BY MOUTH ONCE DAILY 30 tablet 9  . Multiple Vitamin (MULTIVITAMIN) tablet Take 1 tablet by mouth daily.    . pravastatin (PRAVACHOL) 10 MG tablet Take 10 mg by mouth daily.     No current facility-administered medications on file prior to visit.     Allergies  Allergen Reactions  . Tape Other (See Comments)    "PLASTIC" TAPE PULLS OFF THE SKIN AND THE REMOVAL OF ALL TAPES BRUISE HIM!!!    Objective: Physical Exam  General: Well developed, nourished, no acute distress, awake, alert and oriented x 3  Vascular: Dorsalis pedis artery 1/4 bilateral, Posterior tibial artery 1/4 bilateral, skin temperature warm to warm proximal to distal bilateral lower extremities, ++ varicosities, pedal hair present bilateral.  Neurological: Gross sensation present via light touch bilateral.   Dermatological: Skin is warm, dry, and supple bilateral, Nails 1-10 are tender, long, thick, and discolored with mild subungal debris and old dry blood that is much improved from prior, no webspace macerations present bilateral, no open lesions present bilateral, no callus/corns/hyperkeratotic tissue present bilateral. No signs of infection bilateral.  Musculoskeletal: + foot drop and weakness on right.  Assessment and Plan:  Problem List Items Addressed This Visit    None    Visit Diagnoses    Pain due to onychomycosis of toenails of both feet    -  Primary   PVD (peripheral vascular disease) ([place])       Foot drop, right  History of stroke         -Examined patient.  -Re-Discussed treatment options for painful mycotic nails. -Mechanically debrided and reduced mycotic nails with sterile nail nipper and dremel nail file without incident. -Continue with AFO on right like before for dropfoot -Recommend good supportive shoes -Patient to return in 3 months for follow up evaluation or sooner if symptoms  worsen.  Landis Martins, DPM

## 2018-09-22 ENCOUNTER — Ambulatory Visit: Payer: PPO | Admitting: Hematology and Oncology

## 2018-09-22 ENCOUNTER — Other Ambulatory Visit: Payer: PPO

## 2018-10-07 DIAGNOSIS — R0602 Shortness of breath: Secondary | ICD-10-CM | POA: Diagnosis not present

## 2018-10-07 DIAGNOSIS — K219 Gastro-esophageal reflux disease without esophagitis: Secondary | ICD-10-CM | POA: Diagnosis not present

## 2018-10-09 ENCOUNTER — Ambulatory Visit (INDEPENDENT_AMBULATORY_CARE_PROVIDER_SITE_OTHER): Payer: PPO | Admitting: *Deleted

## 2018-10-09 DIAGNOSIS — I255 Ischemic cardiomyopathy: Secondary | ICD-10-CM | POA: Diagnosis not present

## 2018-10-09 LAB — CUP PACEART REMOTE DEVICE CHECK
Battery Remaining Longevity: 60 mo
Battery Remaining Percentage: 72 %
Battery Voltage: 2.98 V
Brady Statistic AP VP Percent: 26 %
Brady Statistic AP VS Percent: 1 %
Brady Statistic AS VP Percent: 71 %
Brady Statistic AS VS Percent: 1.2 %
Brady Statistic RA Percent Paced: 25 %
Brady Statistic RV Percent Paced: 97 %
Date Time Interrogation Session: 20200624080021
HighPow Impedance: 66 Ohm
HighPow Impedance: 66 Ohm
Implantable Lead Implant Date: 20180926
Implantable Lead Implant Date: 20180926
Implantable Lead Implant Date: 20180926
Implantable Lead Location: 753858
Implantable Lead Location: 753859
Implantable Lead Location: 753860
Implantable Lead Model: 7122
Implantable Pulse Generator Implant Date: 20180926
Lead Channel Impedance Value: 450 Ohm
Lead Channel Impedance Value: 450 Ohm
Lead Channel Impedance Value: 690 Ohm
Lead Channel Pacing Threshold Amplitude: 1 V
Lead Channel Pacing Threshold Pulse Width: 0.8 ms
Lead Channel Sensing Intrinsic Amplitude: 11.2 mV
Lead Channel Sensing Intrinsic Amplitude: 2.6 mV
Lead Channel Setting Pacing Amplitude: 2 V
Lead Channel Setting Pacing Amplitude: 2.5 V
Lead Channel Setting Pacing Amplitude: 2.5 V
Lead Channel Setting Pacing Pulse Width: 0.5 ms
Lead Channel Setting Pacing Pulse Width: 0.8 ms
Lead Channel Setting Sensing Sensitivity: 0.5 mV
Pulse Gen Serial Number: 9764296

## 2018-10-16 ENCOUNTER — Encounter: Payer: Self-pay | Admitting: Cardiology

## 2018-10-16 NOTE — Progress Notes (Signed)
Remote ICD transmission.   

## 2018-12-01 ENCOUNTER — Other Ambulatory Visit: Payer: Self-pay

## 2018-12-01 ENCOUNTER — Telehealth: Payer: Self-pay | Admitting: Adult Health

## 2018-12-01 ENCOUNTER — Ambulatory Visit (INDEPENDENT_AMBULATORY_CARE_PROVIDER_SITE_OTHER): Payer: PPO | Admitting: Adult Health

## 2018-12-01 ENCOUNTER — Encounter: Payer: Self-pay | Admitting: Adult Health

## 2018-12-01 VITALS — BP 130/59 | HR 54 | Temp 97.5°F | Ht 73.0 in | Wt 180.2 lb

## 2018-12-01 DIAGNOSIS — I10 Essential (primary) hypertension: Secondary | ICD-10-CM | POA: Diagnosis not present

## 2018-12-01 DIAGNOSIS — R4189 Other symptoms and signs involving cognitive functions and awareness: Secondary | ICD-10-CM | POA: Diagnosis not present

## 2018-12-01 DIAGNOSIS — F0150 Vascular dementia without behavioral disturbance: Secondary | ICD-10-CM | POA: Diagnosis not present

## 2018-12-01 DIAGNOSIS — E785 Hyperlipidemia, unspecified: Secondary | ICD-10-CM | POA: Diagnosis not present

## 2018-12-01 DIAGNOSIS — I63312 Cerebral infarction due to thrombosis of left middle cerebral artery: Secondary | ICD-10-CM | POA: Diagnosis not present

## 2018-12-01 MED ORDER — DONEPEZIL HCL 5 MG PO TABS: 5.0000 mg | ORAL_TABLET | Freq: Every day | ORAL | 2 refills | Status: DC

## 2018-12-01 NOTE — Patient Instructions (Signed)
We will check lab work today along with being scheduled to undergo EEG to assess for underlying causes of memory loss  Referral placed to neuropsychology for neurocognitive evaluation -you will be called to schedule this visit  Start Aricept 5 mg nightly  Continue clopidogrel 75 mg daily  and pravastatin for secondary stroke prevention  Continue to follow up with PCP regarding cholesterol and blood pressure management   Continue to monitor blood pressure at home  Maintain strict control of hypertension with blood pressure goal below 130/90, diabetes with hemoglobin A1c goal below 6.5% and cholesterol with LDL cholesterol (bad cholesterol) goal below 70 mg/dL. I also advised the patient to eat a healthy diet with plenty of whole grains, cereals, fruits and vegetables, exercise regularly and maintain ideal body weight.  Followup in the future with me in 3 months or call earlier if needed       Thank you for coming to see [place] at [facility]. I hope we have been able to provide you high quality care today.  You may receive a patient satisfaction survey over the next few weeks. We would appreciate your feedback and comments so that we may continue to improve ourselves and the health of our patients.   Donepezil tablets What is this medicine? DONEPEZIL (doe NEP e zil) is used to treat mild to moderate dementia caused by Alzheimer's disease. This medicine may be used for other purposes; ask your health care provider or pharmacist if you have questions. COMMON BRAND NAME(S): Aricept What should I tell my health care provider before I take this medicine? They need to know if you have any of these conditions:  asthma or other lung disease  difficulty passing urine  head injury  heart disease  history of irregular heartbeat  liver disease  seizures (convulsions)  stomach or intestinal disease, ulcers or stomach bleeding  an unusual or allergic reaction to  donepezil, other medicines, foods, dyes, or preservatives  pregnant or trying to get pregnant  breast-feeding How should I use this medicine? Take this medicine by mouth with a glass of water. Follow the directions on the prescription label. You may take this medicine with or without food. Take this medicine at regular intervals. This medicine is usually taken before bedtime. Do not take it more often than directed. Continue to take your medicine even if you feel better. Do not stop taking except on your doctor's advice. If you are taking the 23 mg donepezil tablet, swallow it whole; do not cut, crush, or chew it. Talk to your pediatrician regarding the use of this medicine in children. Special care may be needed. Overdosage: If you think you have taken too much of this medicine contact a poison control center or emergency room at once. NOTE: This medicine is only for you. Do not share this medicine with others. What if I miss a dose? If you miss a dose, take it as soon as you can. If it is almost time for your next dose, take only that dose, do not take double or extra doses. What may interact with this medicine? Do not take this medicine with any of the following medications:  certain medicines for fungal infections like itraconazole, fluconazole, posaconazole, and voriconazole  cisapride  dextromethorphan; quinidine  dronedarone  pimozide  quinidine  thioridazine This medicine may also interact with the following medications:  antihistamines for allergy, cough and cold  atropine  bethanechol  carbamazepine  certain medicines for bladder problems like oxybutynin, tolterodine  certain medicines for Parkinson's disease like benztropine, trihexyphenidyl  certain medicines for stomach problems like dicyclomine, hyoscyamine  certain medicines for travel sickness like scopolamine  dexamethasone  dofetilide  ipratropium  NSAIDs, medicines for pain and inflammation,  like ibuprofen or naproxen  other medicines for Alzheimer's disease  other medicines that prolong the QT interval (cause an abnormal heart rhythm)  phenobarbital  phenytoin  rifampin, rifabutin or rifapentine  ziprasidone This list may not describe all possible interactions. Give your health care provider a list of all the medicines, herbs, non-prescription drugs, or dietary supplements you use. Also tell them if you smoke, drink alcohol, or use illegal drugs. Some items may interact with your medicine. What should I watch for while using this medicine? Visit your doctor or health care professional for regular checks on your progress. Check with your doctor or health care professional if your symptoms do not get better or if they get worse. You may get drowsy or dizzy. Do not drive, use machinery, or do anything that needs mental alertness until you know how this drug affects you. What side effects may I notice from receiving this medicine? Side effects that you should report to your doctor or health care professional as soon as possible:  allergic reactions like skin rash, itching or hives, swelling of the face, lips, or tongue  feeling faint or lightheaded, falls  loss of bladder control  seizures  signs and symptoms of a dangerous change in heartbeat or heart rhythm like chest pain; dizziness; fast or irregular heartbeat; palpitations; feeling faint or lightheaded, falls; breathing problems  signs and symptoms of infection like fever or chills; cough; sore throat; pain or trouble passing urine  signs and symptoms of liver injury like dark yellow or brown urine; general ill feeling or flu-like symptoms; light-colored stools; loss of appetite; nausea; right upper belly pain; unusually weak or tired; yellowing of the eyes or skin  slow heartbeat or palpitations  unusual bleeding or bruising  vomiting Side effects that usually do not require medical attention (report to your  doctor or health care professional if they continue or are bothersome):  diarrhea, especially when starting treatment  headache  loss of appetite  muscle cramps  nausea  stomach upset This list may not describe all possible side effects. Call your doctor for medical advice about side effects. You may report side effects to FDA at 1-800-FDA-1088. Where should I keep my medicine? Keep out of reach of children. Store at room temperature between 15 and 30 degrees C (59 and 86 degrees F). Throw away any unused medicine after the expiration date. NOTE: This sheet is a summary. It may not cover all possible information. If you have questions about this medicine, talk to your doctor, pharmacist, or health care provider.  2020 Elsevier/Gold Standard (2018-03-24 10:33:41)    Electroencephalogram, Adult An electroencephalogram (EEG) is a test that records electrical activity in the brain. It is often used to diagnose or monitor problems that are related to the brain, such as:  Seizure disorders.  Sleeping problems.  Changes in behavior.  Head injuries.  Fainting spells. Tell a health care provider about:  Any allergies you have.  All medicines you are taking, including vitamins, herbs, eye drops, creams, and over-the-counter medicines.  Any problems you or family members have had with anesthetic medicines.  Any blood disorders you have.  Any surgeries you have had.  Any medical conditions you have or have had, including psychiatric conditions.  Any history of  illegal drug use or alcohol abuse. What are the risks? Generally, this is a safe test. If you have a seizure disorder, you may be made to have a seizure during the test. This is done so that your brain activity can be recorded during the seizure. What happens before the procedure?   Arrive with your hair clean and dry. Do not comb your hair toward your scalp to add volume (backcomb), and do not put hair spray, oil, or  other hair products in your hair.  Do not have caffeine within 4 hours of having your test.  Follow instructions from your health care provider about: ? Other eating or drinking restrictions. ? Sleeping before the test.  Ask your health care provider about taking your regular and over-the-counter medicines, herbs, and supplements. What happens during the procedure? You will be asked to sit in a chair or lie down. Small metal discs (electrodes) will be attached to your head with an adhesive. These electrodes will pick up on the signals in your brain, and a machine will record the signals. During the test, you may be asked to:  Sit or lie quietly and relax.  Open and close your eyes.  Breathe deeply and rapidly for 3 minutes or longer.  Look at a flashing light for a short period of time.  Read or look at an image.  Go to sleep. When the test is complete, the electrodes will be removed by using a solution such as acetone. What happens after the procedure? It is up to you to get the results of your procedure. Ask your health care provider, or the department that is doing the procedure, when your results will be ready. Summary  An electroencephalogram (EEG) is a test that is often used to diagnose or monitor problems that are related to the brain.  Do not have caffeine within 4 hours of having your test. Follow other instructions from your health care provider about eating and drinking before the test.  During the procedure, small metal discs (electrodes) will be attached to your head with an adhesive.  During the test, you may be asked to sit or lie quietly and relax. You may also be asked to do other activities during the test. This information is not intended to replace advice given to you by your health care provider. Make sure you discuss any questions you have with your health care provider. Document Released: 03/30/2000 Document Revised: 02/04/2018 Document Reviewed: 02/04/2018  Elsevier Patient Education  2020 [employer].

## 2018-12-01 NOTE — Progress Notes (Signed)
[name] [address] [place]. [place] [address] [phone]       OFFICE FOLLOW UP NOTE  Gregory Rodgers Date of Birth:  [birth year] Medical Record Number:  [id number]   Reason for Referral:  stroke follow up  CHIEF COMPLAINT:  Chief Complaint  Patient presents with   Follow-up    6 mon f/u. Daughter present. Treatment room. Patient mentioned that he walks slower than normal. He also has some balance concerns. His daughter mentioned that he has been having hot spells where the only thing that cools him is a ice pack on his head. Afterwards he would have a headache. His daughter mentioned that he is having a hard to time recalling names of loved ones.     HPI: Gregory Rodgers is being seen today in the office for left MCA stroke on 06/01/17. History obtained from patient and chart review. Reviewed all radiology images and labs personally.  Gregory Rodgers is a 85 y.o. male with history of coronary artery disease with previous MI, left bundle branch block, borderline diabetes, hyperlipidemia, history of renal calculi, hypertension, chronic lymphocytic leukemia, CHF, cardiomyopathy with symptomatic nonsustained V. tach, and status post placement of a biventricular pacemaker/AICD presenting with transient speech problems, confusion, transient visual loss, and right lower extremity weakness. He did not receive IV t-PA due to late presentation.  MRI brain reviewed and showed multiple left MCA territory infarcts.  MRA head/neck showed nonocclusive filling defect in the left carotid bulb compatible with thrombus.  2D echo showed EF of 40-55% but no cardiac source of emboli was identified.  TEE did show a large PFO. LDL 81 and A1c 6.0.  Patient was previously on aspirin 81 mg PTA and was recommended to take aspirin 81 mg and Plavix 75 mg daily..  Recommended for patient to start Lipitor 40 mg daily.  Due to left ICA thrombus, he was started on IV heparin in the hospital  but switch to aspirin and Plavix at discharge by Dr. Estanislado Pandy.  Patient discharged home with PT and speech therapy.   07/23/17 visit: Since discharge, patient has been doing well and is accompanied today's visit by wife and daughter.  He continues to do physical therapy where he does have 1 more session but will continue speech therapy for 2 more weeks.  He did fall recently and fell on right arm and was seen at an urgent care.   he was complaining of right shoulder pain/right arm pain but this has been improving over time.  Blood pressure slightly elevated at 151/60 but they do check blood pressure at home and typically runs SBP 130-140.  Wife is requesting orthotic  for leg length discrepancy as recommended by PT.  Patient's PCP recently closed down and is undergoing process of finding a new PCP.  No further complaints at this time.  No new or worsening stroke/TIA symptoms.  Visit 11/26/17: Patient is being seen today for stroke follow-up and is accompanied by his wife and daughter.  He continues to have some complaints of dysarthria but states this does wax and wane.  Unable to correlate worsening of speech due to fatigue or stress.  He continues to do speech exercises at home on his own.  He continues Plavix with bruising but no bleeding.  Lipitor was stopped by PCP after patient was complaining of abdominal pain and was found to have increased liver function panel.  He was started on pravastatin at that time and due to  continued myalgias it was recommended to take pravastatin 10 mg 3 times weekly.  At today's appointment, he was continuing to complain of shoulder pain and abdominal pain.  Unable to see PCP notes or lab work that was obtained by PCP, but wife and daughter both stated that LFTs normalized after stopping of Lipitor.  Recommended to stop pravastatin for possible side effect of myalgias.  If myalgias resolve, recommended to have repeat lab work at follow-up appointment with PCP in October and  continue close follow-up with lipid panel.  If myalgias do not resolve, recommended to restart pravastatin and follow-up with PCP as myalgias may be due to to underlying condition.  Blood pressure today mildly elevated at 142/60 and wife states she occasionally check this at home and is typically SBP 130s.  Denies new or worsening stroke/TIA symptoms. On a side note, wife states that he has occasional vertigo which she has had for 20+ years.  Patient is unsure if this vertigo occurs with position change or rapid head movement.  Wife believes that this is when he complains of increased dizziness sensation after he bends down to pick something up or with position change.  As the symptoms are only on occasion, recommended to avoid rapid position changes or head movement.  Advised that if these become more frequent or intolerable, patient can try PT for possible BPPV.  Visit 06/02/2018: Gregory Rodgers is being seen today for follow-up visit and is accompanied by his wife and daughter.  Wife has concerns of 2-week history of transient confusion episodes such as when he was driving to his son's house with his wife, he asked his wife if she had ever been down that road before and at times will talk to his wife without acknowledging who she is.  She denies any prior memory concerns or confusion states except for when he was admitted for a stroke 1 year prior.  He continues to have residual deficits of expressive aphasia but this has been stable without any worsening.  He did have CT head obtained by PCP on 05/15/2018 which was negative for acute abnormalities.  Namenda was initiated and wife is questioning whether this needs to be continued.  These episodes will last for a few minutes and patient does not appear to remember when these confusional states occur.  Denies loss of consciousness or headaches at this time or any other neurological deficit.  Besides his episodes occurring, he has been stable and continues to maintain  ADLs and some IADLs independently.  He has continued on Plavix without side effects of bleeding or bruising.  His PCP recently restarted him on pravastatin for HDL management.  This was discontinued after prior visit due to possible statin myalgias and per wife, after repeating lipid panel, pravastatin was reinitiated at lower dose.  Blood pressure slightly elevated 146/53 but continues to be monitored at home and typically 130s/80s.  Loop recorder has not shown atrial fibrillation thus far.  No further concerns at this time.  12/01/2018 update: Gregory Rodgers is a 85 year old male who is being seen today for stroke follow-up accompanied by his daughter.  Concerns today regarding cognitive/memory which has been slowly declining since prior visit.  She does endorse episodes of waxing/waning of memory.  Does state that he is having a good day today.  MMSE 14/30 at today's visit.  Daughter concerned regarding patient continue driving.  Continues to live with his wife maintaining ADLs independently but they do have assistance for bill paying and cooking.  He does stay active around his house and ambulates without assistive device.  Denies depression/anxiety symptoms.  Denies any other neurological concerns.  Continues on clopidogrel and pravastatin for secondary stroke prevention without reported side effects.  Blood pressure satisfactory at 130/59.  Loop recorder is not shown atrial fibrillation thus far.  No further concerns at this time.     ROS:   14 system review of systems performed and negative with exception of see HPI   PMH:  Past Medical History:  Diagnosis Date   Basal cell carcinoma    "numerous places"   Borderline diabetes    Cardiomyopathy, dilated ([place]) 03/2016   a) EF ~30% with diffuse HK (not simply ICM) ? partially due to LBBB (septal-lateral dyssynchrony). b) f/u Echo after BiV ICD = EF 45-50%   CLL (chronic lymphocytic leukemia) ([place]) 09/2009   stage 0; on observation   Coronary  artery disease involving native heart without angina pectoris    PTCA of RCA in setting of an MI in 1986-87; no follow-up study since available.   Essential hypertension    History of kidney stones    Hyperlipidemia with target LDL less than 70    With known CAD   L MCA CVA - wtih L ICA 60% lesion & thrombus in Carotid Bulb 05/2017   Left bundle branch block (LBBB) on electrocardiogram    Noted as far back as 2008.   Old inferior wall myocardial infarction 1986/87   Had PTCA most likely of the RCA    PSH:  Past Surgical History:  Procedure Laterality Date   BASAL CELL CARCINOMA EXCISION     "numerous"   BIV ICD INSERTION CRT-D N/A 01/09/2017   Procedure: BIV ICD INSERTION CRT-D;  Surgeon: Evans Lance, MD;  Location: [facility];  Service: Cardiovascular;  Laterality: N/A;   CATARACT EXTRACTION W/ INTRAOCULAR LENS  IMPLANT, BILATERAL Bilateral    CORONARY ANGIOPLASTY  1986/1987   defibllator     dual pacemaker     EP IMPLANTABLE DEVICE N/A 05/10/2016   Procedure: Loop Recorder Insertion;  Surgeon: Evans Lance, MD;  Location: [facility];  Service: Cardiovascular;  Laterality: N/A;  slight blood drop on dressing   JOINT REPLACEMENT     MOHS SURGERY Left 01/02/2017   ear   REPLACEMENT TOTAL KNEE Left    TEE WITHOUT CARDIOVERSION N/A 06/03/2017   Procedure: TRANSESOPHAGEAL ECHOCARDIOGRAM (TEE) - cancelled -    TRANSTHORACIC ECHOCARDIOGRAM  03/2016   Normal LV size with mild LV hypertrophy. EF 30%. Diffuse  hypokinesis wtih septal-lateral dyssynchrony. Normal RV size and systolic function. Mild MR and mild AI.   TRANSTHORACIC ECHOCARDIOGRAM  06/03/2017   for CVA evaluation.  Diffuse hypokinesis EF 45 to 50% with moderate focal basal septal hypertrophy.  Moderate aortic insufficiency.  Large PFO with positive bubble study. -Possible source of embolus.  Ascending aortic aneurysm unchanged.    Social History:  Social History   Socioeconomic  History   Marital status: Married    Spouse name: Not on file   Number of children: Not on file   Years of education: Not on file   Highest education level: Not on file  Occupational History   Not on file  Social Needs   Financial resource strain: Not on file   Food insecurity    Worry: Not on file    Inability: Not on file   Transportation needs    Medical: Not on file  Non-medical: Not on file  Tobacco Use   Smoking status: Never Smoker   Smokeless tobacco: Never Used  Substance and Sexual Activity   Alcohol use: No   Drug use: No   Sexual activity: Not on file  Lifestyle   Physical activity    Days per week: Not on file    Minutes per session: Not on file   Stress: Not on file  Relationships   Social connections    Talks on phone: Not on file    Gets together: Not on file    Attends religious service: Not on file    Active member of club or organization: Not on file    Attends meetings of clubs or organizations: Not on file    Relationship status: Not on file   Intimate partner violence    Fear of current or ex partner: Not on file    Emotionally abused: Not on file    Physically abused: Not on file    Forced sexual activity: Not on file  Other Topics Concern   Not on file  Social History Narrative   Not on file    Family History:  Family History  Problem Relation Age of Onset   Sudden Cardiac Death Neg Hx     Medications:   Current Outpatient Medications on File Prior to Visit  Medication Sig Dispense Refill   Acetaminophen (TYLENOL 8 HOUR PO) Take by mouth.     clopidogrel (PLAVIX) 75 MG tablet Take 1 tablet (75 mg total) by mouth daily. 30 tablet 3   co-enzyme Q-10 30 MG capsule Take 30 mg by mouth daily.     ibuprofen (ADVIL) 200 MG tablet Take 200 mg by mouth as needed.     lisinopril (PRINIVIL,ZESTRIL) 10 MG tablet Take 1 tablet (10 mg total) by mouth daily. PLEASE CONTACT OFFICE FOR ADDITIONAL REFILLS 15 tablet 0    MEGARED OMEGA-3 KRILL OIL 500 MG CAPS Take 1 capsule by mouth daily.     metoprolol succinate (TOPROL-XL) 25 MG 24 hr tablet TAKE ONE TABLET BY MOUTH ONCE DAILY 30 tablet 9   Multiple Vitamin (MULTIVITAMIN) tablet Take 1 tablet by mouth daily.     pravastatin (PRAVACHOL) 10 MG tablet Take 10 mg by mouth 3 (three) times a week.      No current facility-administered medications on file prior to visit.     Allergies:   Allergies  Allergen Reactions   Tape Other (See Comments)    "PLASTIC" TAPE PULLS OFF THE SKIN AND THE REMOVAL OF ALL TAPES BRUISE HIM!!!     Physical Exam  Vitals:   12/01/18 1431  BP: (!) 130/59  Pulse: (!) 54  Temp: (!) 97.5 F (36.4 C)  TempSrc: Oral  Weight: 180 lb 3.2 oz (81.7 kg)  Height: 6\' 1"  (1.854 m)   Body mass index is 23.77 kg/m. No exam data present  MMSE - Mini Mental State Exam 12/01/2018  Orientation to time 4  Orientation to Place 3  Registration 1  Attention/ Calculation 0  Recall 0  Language- name 2 objects 2  Language- repeat 0  Language- follow 3 step command 3  Language- read & follow direction 0  Write a sentence 1  Copy design 0  Total score 14    General: well developed, pleasant elderly Caucasian male seated, in no evident distress Head: head normocephalic and atraumatic.   Neck: supple with no carotid or supraclavicular bruits Cardiovascular: regular rate and rhythm, no murmurs Musculoskeletal: no  deformity Skin:  no rash/petichiae Vascular:  Normal pulses all extremities  Neurologic Exam Mental Status: Awake and fully alert.  Unable to appreciate speech deficits.  Oriented to place and time. Recent memory diminished and remote memory intact. Attention span, concentration and fund of knowledge mildly diminished. Mood and affect appropriate.  MMSE as above Cranial Nerves: Fundoscopic exam reveals sharp disc margins. Pupils equal, briskly reactive to light. Extraocular movements full without nystagmus. Visual fields  full to confrontation.  Severely HOH bilaterally. Facial sensation intact. Face, tongue, palate moves normally and symmetrically.  Motor: Normal bulk and tone. Normal strength in all tested extremity muscles. Sensory.: intact to touch , pinprick , position and vibratory sensation.  Coordination: Rapid alternating movements normal in all extremities. Finger-to-nose and heel-to-shin performed accurately bilaterally. Gait and Station: Arises from chair without difficulty. Stance is normal. Gait demonstrates normal stride length and balance .  Reflexes: 1+ and symmetric. Toes downgoing.     Diagnostic Data (Labs, Imaging, Testing)  CT head 05/15/2018 IMPRESSION: 1. No acute finding or change from February 2019. 2. Brain atrophy and remote small vessel infarcts. 3. Vertebrobasilar dolichoectasia.    Mr Gregory Rodgers Neck W Wo Contrast Mr Gregory Rodgers Wo Contrast 06/01/2017   IMPRESSION:  Multiple small acute infarcts in the left MCA territory.  Irregular plaque proximal left internal carotid artery with 60% diameter narrowing.  Filling defect in the left carotid bulb compatible with thrombus.  This is likely a cause of embolic infarctions in the left MCA territory.  Atrophy and chronic microvascular ischemia.  No intracranial large vessel occlusion.    Transthoracic Echocardiogram  06/01/2017 Study Conclusions - Left ventricle: The cavity size was normal. There was severe focal basal hypertrophy of the septum with otherwise mild concentric hypertrophy. Systolic function was mildly to moderately reduced. The estimated ejection fraction was in the range of 40% to 45%. Wall motion was normal; there were no regional wall motion abnormalities. Doppler parameters are consistent with abnormal left ventricular relaxation (grade 1 diastolic dysfunction). - Aortic valve: Transvalvular velocity was within the normal range. There was no stenosis. There was moderate  regurgitation. Regurgitation pressure half-time: 348 ms. - Aorta: Ascending aortic diameter: 42 mm (S). - Ascending aorta: The ascending aorta was mildly dilated. - Mitral valve: Transvalvular velocity was within the normal range. There was no evidence for stenosis. There was mild regurgitation. - Left atrium: The atrium was mildly dilated. - Right ventricle: The cavity size was normal. Wall thickness was normal. Systolic function was normal. - Tricuspid valve: There was trivial regurgitation. Impressions: - Compared with the prior echo 54/6270, systolic function has improved. The ascending aortic aneurysm is unchanged.  Echo TEE Result date: 06/04/2017 Study Conclusions  is- Left ventricle: Normal LV size with moderate focal basal septal hypertrophy. EF 45-50%, diffuse hypokinesis. - Aortic valve: There was no stenosis. There was moderate regurgitation. - Aorta: Normal caliber but tortuous thoracic aorta with grade III plaque in the descending thoracic aorta. - Mitral valve: There was trivial regurgitation. - Left atrium: The atrium was mildly dilated. No evidence of thrombus in the atrial cavity or appendage. - Right ventricle: The cavity size was normal. Pacer wire or catheter noted in right ventricle. Systolic function was normal. - Right atrium: No evidence of thrombus in the atrial cavity or appendage. - Atrial septum: There was a large PFO, bubble study markedly positive. Impressions: - PFO is possible source of embolus. Left MCA   ASSESSMENT: Gregory Rodgers is a 85 y.o. year old  male here with stroke on 06/01/2017 secondary to left ICA thrombus. Vascular risk factors include HTN, HLD and CAD.  Stable from a stroke standpoint.  Loop recorder has not shown atrial fibrillation thus far.  Daughter endorses slow decline of memory/cognition with MMSE today 14/30   PLAN: -Assess for potential reversible causes of memory loss with dementia panel and  EEG -Initiate Aricept 5 mg nightly -discussion regarding potential side effects with additional information provided -Referral placed to neuropsychology for neurocognitive evaluation.  Questionable whether MMSE score adequate representation of cognition/memory due to severe hearing impairment especially with use of a mask during visit.  Discussion with patient and daughter regarding memory/cognitive concerns likely related to vascular dementia.  No concerns of behaviors at this time -Continue clopidogrel 75 mg daily  and pravastatin for secondary stroke prevention -F/u with PCP regarding your HLD, HTN and CAD management -Continue to monitor loop recorder for atrial fibrillation -continue to monitor BP at home -Advised to continue to stay active and maintain a healthy diet -Maintain strict control of hypertension with blood pressure goal below 130/90, diabetes with hemoglobin A1c goal below 6.5% and cholesterol with LDL cholesterol (bad cholesterol) goal below 70 mg/dL. I also advised the patient to eat a healthy diet with plenty of whole grains, cereals, fruits and vegetables, exercise regularly and maintain ideal body weight.  Follow-up in 3 months or call earlier if needed  Greater than 50% time during this 25 minute visit was spent on counseling and coordination of care regarding memory loss/cognitive decline, discussion regarding ongoing risk factor management with risk factors of HLD and HTN and answered all questions to patient and daughter satisfaction   Frann Rider, [facility]  [facility] [address] [place], [place] [address]  Phone [phone] Fax [phone]

## 2018-12-01 NOTE — Telephone Encounter (Signed)
pts daughter is calling and wants driving stability discussed at todays appt, but doesn't want pt to know that they advised

## 2018-12-01 NOTE — Progress Notes (Signed)
I agree with the above plan 

## 2018-12-03 ENCOUNTER — Ambulatory Visit (INDEPENDENT_AMBULATORY_CARE_PROVIDER_SITE_OTHER): Payer: PPO

## 2018-12-03 ENCOUNTER — Encounter: Payer: Self-pay | Admitting: Psychology

## 2018-12-03 ENCOUNTER — Other Ambulatory Visit: Payer: Self-pay

## 2018-12-03 DIAGNOSIS — R410 Disorientation, unspecified: Secondary | ICD-10-CM | POA: Diagnosis not present

## 2018-12-03 LAB — DEMENTIA PANEL
Homocysteine: 12.4 umol/L (ref 0.0–21.3)
RPR Ser Ql: NONREACTIVE
TSH: 2.48 u[IU]/mL (ref 0.450–4.500)
Vitamin B-12: 703 pg/mL (ref 232–1245)

## 2018-12-08 ENCOUNTER — Telehealth: Payer: Self-pay

## 2018-12-08 ENCOUNTER — Telehealth: Payer: Self-pay | Admitting: Adult Health

## 2018-12-08 NOTE — Telephone Encounter (Signed)
I called pt, spoke to pt's wife, Mardene Celeste, per DPR, and advised her that the EEG did not show any evidence of seizure activity. Pt's wife verbalized understanding of results.

## 2018-12-08 NOTE — Telephone Encounter (Signed)
pts daughter called in and stated that her father ( the pt) is wanting to stop taking donepezil (ARICEPT) 5 MG tablet due to him not being satisfied with the way it is making him feel

## 2018-12-08 NOTE — Telephone Encounter (Signed)
-----   Message from Claris Gower, NP sent at 12/08/2018 10:14 AM EDT ----- Please advise patient/family that recent EEG did not show evidence of seizure activity

## 2018-12-09 NOTE — Telephone Encounter (Signed)
Recommend discontinuing Aricept at this time but to continue to monitor symptoms potential side effects.  Please advise him to call tomorrow or to proceed to ED with any worsening symptoms

## 2018-12-09 NOTE — Telephone Encounter (Signed)
Can you please further assess this such as certain symptoms or side effects he has been experiencing?  If he does not feel comfortable with continuing, this can be discontinued.

## 2018-12-15 ENCOUNTER — Telehealth: Payer: Self-pay

## 2018-12-15 ENCOUNTER — Other Ambulatory Visit: Payer: Self-pay

## 2018-12-15 DIAGNOSIS — D61818 Other pancytopenia: Secondary | ICD-10-CM

## 2018-12-15 NOTE — Telephone Encounter (Signed)
Received call from pt's dtr, Ramona, requesting permission to be present during appts d/t ph has had a stroke and has expressive aphasia.   Approval granted per Threasa Beards, AD.   Ramona is aware she will be allowed to be with pt during appts on 12/16/18

## 2018-12-16 ENCOUNTER — Inpatient Hospital Stay: Payer: PPO | Attending: Hematology and Oncology

## 2018-12-16 ENCOUNTER — Inpatient Hospital Stay (HOSPITAL_BASED_OUTPATIENT_CLINIC_OR_DEPARTMENT_OTHER): Payer: PPO | Admitting: Hematology and Oncology

## 2018-12-16 ENCOUNTER — Other Ambulatory Visit: Payer: Self-pay

## 2018-12-16 DIAGNOSIS — Z79899 Other long term (current) drug therapy: Secondary | ICD-10-CM | POA: Diagnosis not present

## 2018-12-16 DIAGNOSIS — D630 Anemia in neoplastic disease: Secondary | ICD-10-CM | POA: Insufficient documentation

## 2018-12-16 DIAGNOSIS — C9110 Chronic lymphocytic leukemia of B-cell type not having achieved remission: Secondary | ICD-10-CM

## 2018-12-16 DIAGNOSIS — F0390 Unspecified dementia without behavioral disturbance: Secondary | ICD-10-CM | POA: Diagnosis not present

## 2018-12-16 DIAGNOSIS — D696 Thrombocytopenia, unspecified: Secondary | ICD-10-CM | POA: Insufficient documentation

## 2018-12-16 DIAGNOSIS — D61818 Other pancytopenia: Secondary | ICD-10-CM

## 2018-12-16 LAB — CBC WITH DIFFERENTIAL (CANCER CENTER ONLY)
Abs Immature Granulocytes: 0.11 10*3/uL — ABNORMAL HIGH (ref 0.00–0.07)
Basophils Absolute: 0 10*3/uL (ref 0.0–0.1)
Basophils Relative: 0 %
Eosinophils Absolute: 0.2 10*3/uL (ref 0.0–0.5)
Eosinophils Relative: 0 %
HCT: 33.3 % — ABNORMAL LOW (ref 39.0–52.0)
Hemoglobin: 10.5 g/dL — ABNORMAL LOW (ref 13.0–17.0)
Immature Granulocytes: 0 %
Lymphocytes Relative: 90 %
Lymphs Abs: 37.6 10*3/uL — ABNORMAL HIGH (ref 0.7–4.0)
MCH: 33.1 pg (ref 26.0–34.0)
MCHC: 31.5 g/dL (ref 30.0–36.0)
MCV: 105 fL — ABNORMAL HIGH (ref 80.0–100.0)
Monocytes Absolute: 0.6 10*3/uL (ref 0.1–1.0)
Monocytes Relative: 2 %
Neutro Abs: 3.5 10*3/uL (ref 1.7–7.7)
Neutrophils Relative %: 8 %
Platelet Count: 187 10*3/uL (ref 150–400)
RBC: 3.17 MIL/uL — ABNORMAL LOW (ref 4.22–5.81)
RDW: 13.4 % (ref 11.5–15.5)
WBC Count: 42 10*3/uL — ABNORMAL HIGH (ref 4.0–10.5)
nRBC: 0 % (ref 0.0–0.2)

## 2018-12-17 ENCOUNTER — Encounter: Payer: Self-pay | Admitting: Hematology and Oncology

## 2018-12-17 DIAGNOSIS — F0390 Unspecified dementia without behavioral disturbance: Secondary | ICD-10-CM | POA: Insufficient documentation

## 2018-12-17 NOTE — Assessment & Plan Note (Signed)
The patient remained asymptomatic.  Blood work is stable with no significant signs of disease progression Given his age and severe dementia, I will discontinue future follow-up The likelihood that he will require treatment in the future is low, estimated to be less than 10% Even if the patient has disease progression in the future, he is not a candidate for systemic treatment I do not recommend routine surveillance blood count monitoring unless the patient had symptoms The symptoms expected would be related to anemia or thrombocytopenia I discussed the rationale of not pursuing further follow-up with his family member and she agreed

## 2018-12-17 NOTE — Assessment & Plan Note (Signed)
He has multifactorial anemia, likely anemia chronic disease and related to CLL He does not need treatment.  He is not symptomatic

## 2018-12-17 NOTE — Progress Notes (Signed)
[facility] OFFICE PROGRESS NOTE  Patient Care Team: Earlyne Iba, NP as PCP - General (Nurse Practitioner) Heath Lark, MD as Consulting Physician (Hematology and Oncology)  ASSESSMENT & PLAN:  CLL (chronic lymphocytic leukemia) [facility]) The patient remained asymptomatic.  Blood work is stable with no significant signs of disease progression Given his age and severe dementia, I will discontinue future follow-up The likelihood that he will require treatment in the future is low, estimated to be less than 10% Even if the patient has disease progression in the future, he is not a candidate for systemic treatment I do not recommend routine surveillance blood count monitoring unless the patient had symptoms The symptoms expected would be related to anemia or thrombocytopenia I discussed the rationale of not pursuing further follow-up with his family member and she agreed  Anemia in neoplastic disease He has multifactorial anemia, likely anemia chronic disease and related to CLL He does not need treatment.  He is not symptomatic  Dementia [facility]) The patient has advanced dementia and does not recognize family members I will discontinue future follow-up and defer to primary care doctor   No orders of the defined types were placed in this encounter.   INTERVAL HISTORY: Please see below for problem oriented charting. He returns for further follow-up He is accompanied by family member due to advanced dementia There were no recent reported recent infection, fever or chills There was no recent reported bleeding  SUMMARY OF ONCOLOGIC HISTORY: Oncology History  CLL (chronic lymphocytic leukemia) ([place])  09/26/2009 Pathology Results   Flow cytometry confirmed diagnosis of CLL. LEUKEMIC CELLS ARE NEGATIVE FOR CD38 AND ZAP70 EXPRESSION     REVIEW OF SYSTEMS:   Constitutional: Denies fevers, chills or abnormal weight loss Eyes: Denies blurriness of vision Ears, nose, mouth,  throat, and face: Denies mucositis or sore throat Respiratory: Denies cough, dyspnea or wheezes Cardiovascular: Denies palpitation, chest discomfort or lower extremity swelling Gastrointestinal:  Denies nausea, heartburn or change in bowel habits Skin: Denies abnormal skin rashes Lymphatics: Denies new lymphadenopathy Neurological:Denies numbness, tingling or new weaknesses Behavioral/Psych: Mood is stable, no new changes  All other systems were reviewed with the patient and are negative.  I have reviewed the past medical history, past surgical history, social history and family history with the patient and they are unchanged from previous note.  ALLERGIES:  is allergic to tape.  MEDICATIONS:  Current Outpatient Medications  Medication Sig Dispense Refill  . Acetaminophen (TYLENOL 8 HOUR PO) Take by mouth.    . clopidogrel (PLAVIX) 75 MG tablet Take 1 tablet (75 mg total) by mouth daily. 30 tablet 3  . co-enzyme Q-10 30 MG capsule Take 30 mg by mouth daily.    Marland Kitchen donepezil (ARICEPT) 5 MG tablet Take 1 tablet (5 mg total) by mouth at bedtime. 30 tablet 2  . ibuprofen (ADVIL) 200 MG tablet Take 200 mg by mouth as needed.    Marland Kitchen lisinopril (PRINIVIL,ZESTRIL) 10 MG tablet Take 1 tablet (10 mg total) by mouth daily. PLEASE CONTACT OFFICE FOR ADDITIONAL REFILLS 15 tablet 0  . MEGARED OMEGA-3 KRILL OIL 500 MG CAPS Take 1 capsule by mouth daily.    . metoprolol succinate (TOPROL-XL) 25 MG 24 hr tablet TAKE ONE TABLET BY MOUTH ONCE DAILY 30 tablet 9  . Multiple Vitamin (MULTIVITAMIN) tablet Take 1 tablet by mouth daily.    . pravastatin (PRAVACHOL) 10 MG tablet Take 10 mg by mouth 3 (three) times a week.  No current facility-administered medications for this visit.     PHYSICAL EXAMINATION: ECOG PERFORMANCE STATUS: 2 - Symptomatic, <50% confined to bed  Vitals:   12/16/18 0954  BP: (!) 145/57  Pulse: (!) 54  Resp: 18  Temp: 97.8 F (36.6 C)  SpO2: 98%   Filed Weights   12/16/18  0954  Weight: 177 lb 3.2 oz (80.4 kg)    GENERAL:alert, no distress and comfortable SKIN: Noted support keratosis EYES: normal, Conjunctiva are pink and non-injected, sclera clear OROPHARYNX:no exudate, no erythema and lips, buccal mucosa, and tongue normal  NECK: supple, thyroid normal size, non-tender, without nodularity LYMPH:  no palpable lymphadenopathy in the cervical, axillary or inguinal LUNGS: clear to auscultation and percussion with normal breathing effort HEART: regular rate & rhythm and no murmurs and no lower extremity edema ABDOMEN:abdomen soft, non-tender and normal bowel sounds Musculoskeletal:no cyanosis of digits and no clubbing  NEURO: alert no focal motor/sensory deficits  LABORATORY DATA:  I have reviewed the data as listed    Component Value Date/Time   NA 140 06/03/2017 0232   NA 141 09/30/2015 0808   K 4.5 06/03/2017 0232   K 4.5 09/30/2015 0808   CL 109 06/03/2017 0232   CL 108 (H) 08/25/2012 1040   CO2 23 06/03/2017 0232   CO2 25 09/30/2015 0808   GLUCOSE 104 (H) 06/03/2017 0232   GLUCOSE 89 09/30/2015 0808   GLUCOSE 91 08/25/2012 1040   BUN 18 06/03/2017 0232   BUN 23.3 09/30/2015 0808   CREATININE 1.02 06/03/2017 0232   CREATININE 1.0 09/30/2015 0808   CALCIUM 8.4 (L) 06/03/2017 0232   CALCIUM 8.7 09/30/2015 0808   PROT 4.9 (L) 06/01/2017 0452   PROT 5.8 (L) 09/30/2015 0808   ALBUMIN 3.2 (L) 06/01/2017 0452   ALBUMIN 3.5 09/30/2015 0808   AST 18 06/01/2017 0452   AST 16 09/30/2015 0808   ALT 12 (L) 06/01/2017 0452   ALT 13 09/30/2015 0808   ALKPHOS 59 06/01/2017 0452   ALKPHOS 65 09/30/2015 0808   BILITOT 0.7 06/01/2017 0452   BILITOT 0.51 09/30/2015 0808   GFRNONAA >60 06/03/2017 0232   GFRAA >60 06/03/2017 0232    No results found for: SPEP, UPEP  Lab Results  Component Value Date   WBC 42.0 (H) 12/16/2018   NEUTROABS 3.5 12/16/2018   HGB 10.5 (L) 12/16/2018   HCT 33.3 (L) 12/16/2018   MCV 105.0 (H) 12/16/2018   PLT 187  12/16/2018      Chemistry      Component Value Date/Time   NA 140 06/03/2017 0232   NA 141 09/30/2015 0808   K 4.5 06/03/2017 0232   K 4.5 09/30/2015 0808   CL 109 06/03/2017 0232   CL 108 (H) 08/25/2012 1040   CO2 23 06/03/2017 0232   CO2 25 09/30/2015 0808   BUN 18 06/03/2017 0232   BUN 23.3 09/30/2015 0808   CREATININE 1.02 06/03/2017 0232   CREATININE 1.0 09/30/2015 0808      Component Value Date/Time   CALCIUM 8.4 (L) 06/03/2017 0232   CALCIUM 8.7 09/30/2015 0808   ALKPHOS 59 06/01/2017 0452   ALKPHOS 65 09/30/2015 0808   AST 18 06/01/2017 0452   AST 16 09/30/2015 0808   ALT 12 (L) 06/01/2017 0452   ALT 13 09/30/2015 0808   BILITOT 0.7 06/01/2017 0452   BILITOT 0.51 09/30/2015 0808      All questions were answered. The patient knows to call the clinic with any problems, questions  or concerns. No barriers to learning was detected.  I spent 15 minutes counseling the patient face to face. The total time spent in the appointment was 20 minutes and more than 50% was on counseling and review of test results  Heath Lark, MD 12/17/2018 8:04 AM

## 2018-12-17 NOTE — Assessment & Plan Note (Signed)
The patient has advanced dementia and does not recognize family members I will discontinue future follow-up and defer to primary care doctor

## 2018-12-23 DIAGNOSIS — N390 Urinary tract infection, site not specified: Secondary | ICD-10-CM | POA: Diagnosis not present

## 2018-12-23 DIAGNOSIS — Z139 Encounter for screening, unspecified: Secondary | ICD-10-CM | POA: Diagnosis not present

## 2018-12-24 DIAGNOSIS — Z9181 History of falling: Secondary | ICD-10-CM | POA: Diagnosis not present

## 2018-12-24 DIAGNOSIS — Z139 Encounter for screening, unspecified: Secondary | ICD-10-CM | POA: Diagnosis not present

## 2018-12-24 DIAGNOSIS — Z136 Encounter for screening for cardiovascular disorders: Secondary | ICD-10-CM | POA: Diagnosis not present

## 2018-12-24 DIAGNOSIS — Z0000 Encounter for general adult medical examination without abnormal findings: Secondary | ICD-10-CM | POA: Diagnosis not present

## 2018-12-24 DIAGNOSIS — Z1331 Encounter for screening for depression: Secondary | ICD-10-CM | POA: Diagnosis not present

## 2018-12-24 DIAGNOSIS — E785 Hyperlipidemia, unspecified: Secondary | ICD-10-CM | POA: Diagnosis not present

## 2018-12-26 ENCOUNTER — Encounter: Payer: Self-pay | Admitting: Sports Medicine

## 2018-12-26 ENCOUNTER — Other Ambulatory Visit: Payer: Self-pay

## 2018-12-26 ENCOUNTER — Ambulatory Visit (INDEPENDENT_AMBULATORY_CARE_PROVIDER_SITE_OTHER): Payer: PPO | Admitting: Sports Medicine

## 2018-12-26 DIAGNOSIS — I739 Peripheral vascular disease, unspecified: Secondary | ICD-10-CM

## 2018-12-26 DIAGNOSIS — M79674 Pain in right toe(s): Secondary | ICD-10-CM

## 2018-12-26 DIAGNOSIS — M79675 Pain in left toe(s): Secondary | ICD-10-CM

## 2018-12-26 DIAGNOSIS — B351 Tinea unguium: Secondary | ICD-10-CM

## 2018-12-26 DIAGNOSIS — Z8673 Personal history of transient ischemic attack (TIA), and cerebral infarction without residual deficits: Secondary | ICD-10-CM

## 2018-12-26 DIAGNOSIS — M21371 Foot drop, right foot: Secondary | ICD-10-CM | POA: Diagnosis not present

## 2018-12-26 NOTE — Progress Notes (Signed)
Subjective: Gregory Rodgers is a 85 y.o. male patient seen today in office with complaint of mildly painful thickened and elongated toenails; unable to trim.  Patient denies any changes with medical history since last visit.  Patient is assisted by wife this visit who is also being seen. Patient has no other pedal complaints at this time.   Patient Active Problem List   Diagnosis Date Noted  . Dementia ([place]) 12/17/2018  . Near syncope 11/02/2017  . History of skin cancer 09/23/2017  . Transient speech disturbance 06/01/2017  . 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 Cerebral embolism with cerebral infarction 06/01/2017  . Polymorphic ventricular tachycardia ([place]) 01/09/2017  . NSVT (nonsustained ventricular tachycardia) ([place]) 05/01/2016  . Left bundle branch block (LBBB) on electrocardiogram 02/27/2016  . Dizziness of unknown cause 02/27/2016  . Bradycardia with 41-50 beats per minute 02/27/2016  . Ischemic cardiomyopathy: EF 40-45% reduced to ~30% - post ICD up to 45-50%. 02/27/2016  . Pancytopenia, acquired ([place]) 09/30/2015  . Thrombocytopenia ([place]) 10/01/2014  . 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 CLL (chronic lymphocytic leukemia) ([place]) 06/20/2011  . Hyperlipidemia 06/20/2011  . Essential hypertension 06/20/2011  . Borderline diabetes 06/20/2011  . CAD S/P percutaneous coronary angioplasty 02/26/1985    Current Outpatient Medications on File Prior to Visit  Medication Sig Dispense Refill  . Acetaminophen (TYLENOL 8 HOUR PO) Take by mouth.    . ciprofloxacin (CIPRO) 500 MG tablet TAKE ONE TABLET BY MOUTH TWICE DAILY UNTIL FINISHED    . clopidogrel (PLAVIX) 75 MG tablet Take 1 tablet (75 mg total) by mouth daily. 30 tablet 3  . co-enzyme Q-10 30 MG capsule Take 30 mg by mouth daily.    Marland Kitchen donepezil (ARICEPT) 5 MG tablet Take 1 tablet (5 mg total) by mouth at bedtime. 30 tablet 2  . ibuprofen (ADVIL) 200 MG tablet Take 200 mg by mouth as needed.    Marland Kitchen  lisinopril (PRINIVIL,ZESTRIL) 10 MG tablet Take 1 tablet (10 mg total) by mouth daily. PLEASE CONTACT OFFICE FOR ADDITIONAL REFILLS 15 tablet 0  . MEGARED OMEGA-3 KRILL OIL 500 MG CAPS Take 1 capsule by mouth daily.    . metoprolol succinate (TOPROL-XL) 25 MG 24 hr tablet TAKE ONE TABLET BY MOUTH ONCE DAILY 30 tablet 9  . Multiple Vitamin (MULTIVITAMIN) tablet Take 1 tablet by mouth daily.    Marland Kitchen omeprazole (PRILOSEC) 20 MG capsule     . pravastatin (PRAVACHOL) 10 MG tablet Take 10 mg by mouth 3 (three) times a week.     . tamsulosin (FLOMAX) 0.4 MG CAPS capsule Take 0.4 mg by mouth daily.     No current facility-administered medications on file prior to visit.     Allergies  Allergen Reactions  . Tape Other (See Comments)    "PLASTIC" TAPE PULLS OFF THE SKIN AND THE REMOVAL OF ALL TAPES BRUISE HIM!!!    Objective: Physical Exam  General: Well developed, nourished, no acute distress, awake, alert and oriented x 3  Vascular: Dorsalis pedis artery 1/4 bilateral, Posterior tibial artery 0/4 bilateral, skin temperature warm to warm proximal to distal bilateral lower extremities, ++ varicosities, 1+ pitting edema bilateral, diminished pedal hair present bilateral.  Neurological: Gross sensation present via light touch bilateral.   Dermatological: Skin is warm, dry, and supple bilateral, Nails 1-10 are tender, long, thick, and discolored with mild subungal debris, no webspace macerations present bilateral, no open lesions present bilateral, no callus/corns/hyperkeratotic tissue present bilateral. No signs of infection bilateral.  Musculoskeletal: + foot drop and weakness on right.  Assessment and Plan:  Problem List Items Addressed This Visit    None    Visit Diagnoses    Pain due to onychomycosis of toenails of both feet    -  Primary   PVD (peripheral vascular disease) ([place])       Foot drop, right       History of stroke         -Examined patient.  -Re-Discussed treatment options  for painful mycotic nails. -Mechanically debrided and reduced mycotic nails with sterile nail nipper and dremel nail file without incident. -Continue with AFO brace and supportive shoes like before -Patient to return in 3 months for follow up evaluation or sooner if symptoms worsen.  Landis Martins, DPM

## 2019-01-01 DIAGNOSIS — Z87440 Personal history of urinary (tract) infections: Secondary | ICD-10-CM | POA: Diagnosis not present

## 2019-01-01 DIAGNOSIS — Z23 Encounter for immunization: Secondary | ICD-10-CM | POA: Diagnosis not present

## 2019-01-01 DIAGNOSIS — H6120 Impacted cerumen, unspecified ear: Secondary | ICD-10-CM | POA: Diagnosis not present

## 2019-01-08 ENCOUNTER — Ambulatory Visit (INDEPENDENT_AMBULATORY_CARE_PROVIDER_SITE_OTHER): Payer: PPO | Admitting: *Deleted

## 2019-01-08 DIAGNOSIS — I255 Ischemic cardiomyopathy: Secondary | ICD-10-CM

## 2019-01-08 LAB — CUP PACEART REMOTE DEVICE CHECK
Battery Remaining Longevity: 58 mo
Battery Remaining Percentage: 69 %
Battery Voltage: 2.96 V
Brady Statistic AP VP Percent: 27 %
Brady Statistic AP VS Percent: 1 %
Brady Statistic AS VP Percent: 70 %
Brady Statistic AS VS Percent: 1.1 %
Brady Statistic RA Percent Paced: 27 %
Brady Statistic RV Percent Paced: 98 %
Date Time Interrogation Session: 20200923081927
HighPow Impedance: 69 Ohm
HighPow Impedance: 69 Ohm
Implantable Lead Implant Date: 20180926
Implantable Lead Implant Date: 20180926
Implantable Lead Implant Date: 20180926
Implantable Lead Location: 753858
Implantable Lead Location: 753859
Implantable Lead Location: 753860
Implantable Lead Model: 7122
Implantable Pulse Generator Implant Date: 20180926
Lead Channel Impedance Value: 450 Ohm
Lead Channel Impedance Value: 480 Ohm
Lead Channel Impedance Value: 700 Ohm
Lead Channel Pacing Threshold Amplitude: 0.5 V
Lead Channel Pacing Threshold Amplitude: 0.75 V
Lead Channel Pacing Threshold Amplitude: 1 V
Lead Channel Pacing Threshold Pulse Width: 0.5 ms
Lead Channel Pacing Threshold Pulse Width: 0.5 ms
Lead Channel Pacing Threshold Pulse Width: 0.8 ms
Lead Channel Sensing Intrinsic Amplitude: 11.7 mV
Lead Channel Sensing Intrinsic Amplitude: 2.4 mV
Lead Channel Setting Pacing Amplitude: 2 V
Lead Channel Setting Pacing Amplitude: 2.5 V
Lead Channel Setting Pacing Amplitude: 2.5 V
Lead Channel Setting Pacing Pulse Width: 0.5 ms
Lead Channel Setting Pacing Pulse Width: 0.8 ms
Lead Channel Setting Sensing Sensitivity: 0.5 mV
Pulse Gen Serial Number: 9764296

## 2019-01-14 ENCOUNTER — Encounter: Payer: Self-pay | Admitting: Cardiology

## 2019-01-14 NOTE — Progress Notes (Signed)
Remote ICD transmission.   

## 2019-02-01 DIAGNOSIS — N200 Calculus of kidney: Secondary | ICD-10-CM | POA: Diagnosis not present

## 2019-02-01 DIAGNOSIS — N3001 Acute cystitis with hematuria: Secondary | ICD-10-CM | POA: Diagnosis not present

## 2019-02-02 DIAGNOSIS — R319 Hematuria, unspecified: Secondary | ICD-10-CM | POA: Diagnosis not present

## 2019-02-02 DIAGNOSIS — Z6826 Body mass index (BMI) 26.0-26.9, adult: Secondary | ICD-10-CM | POA: Diagnosis not present

## 2019-02-03 DIAGNOSIS — R600 Localized edema: Secondary | ICD-10-CM | POA: Diagnosis not present

## 2019-02-03 DIAGNOSIS — M79604 Pain in right leg: Secondary | ICD-10-CM | POA: Diagnosis not present

## 2019-02-03 DIAGNOSIS — L03115 Cellulitis of right lower limb: Secondary | ICD-10-CM | POA: Diagnosis not present

## 2019-02-06 DIAGNOSIS — M7989 Other specified soft tissue disorders: Secondary | ICD-10-CM | POA: Diagnosis not present

## 2019-02-06 DIAGNOSIS — M79604 Pain in right leg: Secondary | ICD-10-CM | POA: Diagnosis not present

## 2019-02-06 DIAGNOSIS — M79671 Pain in right foot: Secondary | ICD-10-CM | POA: Diagnosis not present

## 2019-02-06 DIAGNOSIS — M25571 Pain in right ankle and joints of right foot: Secondary | ICD-10-CM | POA: Diagnosis not present

## 2019-02-06 DIAGNOSIS — L03115 Cellulitis of right lower limb: Secondary | ICD-10-CM | POA: Diagnosis not present

## 2019-02-13 ENCOUNTER — Encounter: Payer: Self-pay | Admitting: Internal Medicine

## 2019-02-13 ENCOUNTER — Other Ambulatory Visit: Payer: Self-pay

## 2019-02-13 ENCOUNTER — Ambulatory Visit: Payer: PPO | Admitting: Internal Medicine

## 2019-02-13 VITALS — BP 138/64 | HR 52 | Ht 73.0 in | Wt 176.0 lb

## 2019-02-13 DIAGNOSIS — I428 Other cardiomyopathies: Secondary | ICD-10-CM | POA: Insufficient documentation

## 2019-02-13 DIAGNOSIS — Z95810 Presence of automatic (implantable) cardiac defibrillator: Secondary | ICD-10-CM | POA: Diagnosis not present

## 2019-02-13 DIAGNOSIS — I4729 Other ventricular tachycardia: Secondary | ICD-10-CM

## 2019-02-13 DIAGNOSIS — I472 Ventricular tachycardia: Secondary | ICD-10-CM | POA: Diagnosis not present

## 2019-02-13 NOTE — Patient Instructions (Signed)
Medication Instructions:  Your physician recommends that you continue on your current medications as directed. Please refer to the Current Medication list given to you today.  Labwork: None ordered.  Testing/Procedures: None ordered.  Follow-Up: Your physician wants you to follow-up in: one year with Dr. Lovena Le.   You will receive a reminder letter in the mail two months in advance. If you don't receive a letter, please call our office to schedule the follow-up appointment.  Remote monitoring is used to monitor your ICD from home. This monitoring reduces the number of office visits required to check your device to one time per year. It allows [place] to keep an eye on the functioning of your device to ensure it is working properly. You are scheduled for a device check from home on 04/09/2019. You may send your transmission at any time that day. If you have a wireless device, the transmission will be sent automatically. After your physician reviews your transmission, you will receive a postcard with your next transmission date.  Any Other Special Instructions Will Be Listed Below (If Applicable).  If you need a refill on your cardiac medications before your next appointment, please call your pharmacy.

## 2019-02-13 NOTE — Progress Notes (Signed)
HPI Gregory Rodgers returns today for ongoing evaluation of VT, chronic systolic heart failure, LBBB, s/p biv ICD insertion. The patient has done well in the interim. No chest pain or sob. He remains active. He has sustained a stroke in the interim and has become a bit more sedentary.  Allergies  Allergen Reactions  . Tape Other (See Comments)    "PLASTIC" TAPE PULLS OFF THE SKIN AND THE REMOVAL OF ALL TAPES BRUISE HIM!!!     Current Outpatient Medications  Medication Sig Dispense Refill  . Acetaminophen (TYLENOL 8 HOUR PO) Take by mouth.    . ciprofloxacin (CIPRO) 500 MG tablet TAKE ONE TABLET BY MOUTH TWICE DAILY UNTIL FINISHED    . clopidogrel (PLAVIX) 75 MG tablet Take 1 tablet (75 mg total) by mouth daily. 30 tablet 3  . co-enzyme Q-10 30 MG capsule Take 30 mg by mouth daily.    Marland Kitchen donepezil (ARICEPT) 5 MG tablet Take 1 tablet (5 mg total) by mouth at bedtime. 30 tablet 2  . ibuprofen (ADVIL) 200 MG tablet Take 200 mg by mouth as needed.    Marland Kitchen lisinopril (PRINIVIL,ZESTRIL) 10 MG tablet Take 1 tablet (10 mg total) by mouth daily. PLEASE CONTACT OFFICE FOR ADDITIONAL REFILLS 15 tablet 0  . MEGARED OMEGA-3 KRILL OIL 500 MG CAPS Take 1 capsule by mouth daily.    . metoprolol succinate (TOPROL-XL) 25 MG 24 hr tablet TAKE ONE TABLET BY MOUTH ONCE DAILY 30 tablet 9  . Multiple Vitamin (MULTIVITAMIN) tablet Take 1 tablet by mouth daily.    Marland Kitchen omeprazole (PRILOSEC) 20 MG capsule     . pravastatin (PRAVACHOL) 10 MG tablet Take 10 mg by mouth 3 (three) times a week.     . tamsulosin (FLOMAX) 0.4 MG CAPS capsule Take 0.4 mg by mouth daily.     No current facility-administered medications for this visit.      Past Medical History:  Diagnosis Date  . Basal cell carcinoma    "numerous places"  . Borderline diabetes   . Cardiomyopathy, dilated ([place]) 03/2016   a) EF ~30% with diffuse HK (not simply ICM) ? partially due to LBBB (septal-lateral dyssynchrony). b) f/u Echo after BiV ICD = EF  45-50%  . CLL (chronic lymphocytic leukemia) ([place]) 09/2009   stage 0; on observation  . Coronary artery disease involving native heart without angina pectoris    PTCA of RCA in setting of an MI in 1986-87; no follow-up study since available.  . Essential hypertension   . History of kidney stones   . Hyperlipidemia with target LDL less than 70    With known CAD  . 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 Left bundle branch block (LBBB) on electrocardiogram    Noted as far back as 2008.  . Old inferior wall myocardial infarction 1986/87   Had PTCA most likely of the RCA    ROS:   All systems reviewed and negative except as noted in the HPI.   Past Surgical History:  Procedure Laterality Date  . BASAL CELL CARCINOMA EXCISION     "numerous"  . BIV ICD INSERTION CRT-D N/A 01/09/2017   Procedure: BIV ICD INSERTION CRT-D;  Surgeon: Evans Lance, MD;  Location: [facility];  Service: Cardiovascular;  Laterality: N/A;  . CATARACT EXTRACTION W/ INTRAOCULAR LENS  IMPLANT, BILATERAL Bilateral   . CORONARY ANGIOPLASTY  1986/1987  . defibllator    .  dual pacemaker    . EP IMPLANTABLE DEVICE N/A 05/10/2016   Procedure: Loop Recorder Insertion;  Surgeon: Evans Lance, MD;  Location: [facility];  Service: Cardiovascular;  Laterality: N/A;  slight blood drop on dressing  . JOINT REPLACEMENT    . MOHS SURGERY Left 01/02/2017   ear  . REPLACEMENT TOTAL KNEE Left   . TEE WITHOUT CARDIOVERSION N/A 06/03/2017   Procedure: TRANSESOPHAGEAL ECHOCARDIOGRAM (TEE) - cancelled -   . TRANSTHORACIC ECHOCARDIOGRAM  03/2016   Normal LV size with mild LV hypertrophy. EF 30%. Diffuse  hypokinesis wtih septal-lateral dyssynchrony. Normal RV size and systolic function. Mild Gregory and mild AI.  Marland Kitchen TRANSTHORACIC ECHOCARDIOGRAM  06/03/2017   for CVA evaluation.  Diffuse hypokinesis EF 45 to 50% with moderate focal basal septal hypertrophy.  Moderate aortic insufficiency.   Large PFO with positive bubble study. -Possible source of embolus.  Ascending aortic aneurysm unchanged.     Family History  Problem Relation Age of Onset  . Sudden Cardiac Death Neg Hx      Social History   Socioeconomic History  . Marital status: Married    Spouse name: Not on file  . Number of children: Not on file  . Years of education: Not on file  . Highest education level: Not on file  Occupational History  . Not on file  Social Needs  . Financial resource strain: Not on file  . Food insecurity    Worry: Not on file    Inability: Not on file  . Transportation needs    Medical: Not on file    Non-medical: Not on file  Tobacco Use  . Smoking status: Never Smoker  . Smokeless tobacco: Never Used  Substance and Sexual Activity  . Alcohol use: No  . Drug use: No  . Sexual activity: Not on file  Lifestyle  . Physical activity    Days per week: Not on file    Minutes per session: Not on file  . Stress: Not on file  Relationships  . Social Herbalist on phone: Not on file    Gets together: Not on file    Attends religious service: Not on file    Active member of club or organization: Not on file    Attends meetings of clubs or organizations: Not on file    Relationship status: Not on file  . Intimate partner violence    Fear of current or ex partner: Not on file    Emotionally abused: Not on file    Physically abused: Not on file    Forced sexual activity: Not on file  Other Topics Concern  . Not on file  Social History Narrative  . Not on file     BP 138/64   Pulse (!) 52   Ht 6\' 1"  (1.854 m)   Wt 176 lb (79.8 kg)   SpO2 96%   BMI 23.22 kg/m   Physical Exam:  Well appearing NAD HEENT: Unremarkable Neck:  No JVD, no thyromegally Lymphatics:  No adenopathy Back:  No CVA tenderness Lungs:  Clear with no wheezes HEART:  Regular rate rhythm, no murmurs, no rubs, no clicks Abd:  soft, positive bowel sounds, no organomegally, no rebound,  no guarding Ext:  2 plus pulses, no edema, no cyanosis, no clubbing Skin:  No rashes no nodules Neuro:  CN II through XII intact, motor grossly intact  EKG - nsr with biv pacing  DEVICE  Normal device  function.  See PaceArt for details.   Assess/Plan: 1. Chronic systolic heart failure - his symptoms are class 2. He will continue his current meds. 2. ICD - his [employer] Biv ICD is working normally.  3. Stroke - he has minimal residual deficit. He has not had any significant atrial fib.   Gregory Rodgers.D.

## 2019-02-23 ENCOUNTER — Ambulatory Visit: Payer: PPO | Admitting: Adult Health

## 2019-03-16 DIAGNOSIS — M7989 Other specified soft tissue disorders: Secondary | ICD-10-CM | POA: Diagnosis not present

## 2019-03-16 DIAGNOSIS — N390 Urinary tract infection, site not specified: Secondary | ICD-10-CM | POA: Diagnosis not present

## 2019-03-16 DIAGNOSIS — Z6825 Body mass index (BMI) 25.0-25.9, adult: Secondary | ICD-10-CM | POA: Diagnosis not present

## 2019-03-18 DIAGNOSIS — R319 Hematuria, unspecified: Secondary | ICD-10-CM | POA: Diagnosis not present

## 2019-03-18 DIAGNOSIS — C9110 Chronic lymphocytic leukemia of B-cell type not having achieved remission: Secondary | ICD-10-CM | POA: Diagnosis not present

## 2019-03-18 DIAGNOSIS — L03115 Cellulitis of right lower limb: Secondary | ICD-10-CM | POA: Diagnosis not present

## 2019-03-27 ENCOUNTER — Other Ambulatory Visit: Payer: Self-pay

## 2019-03-27 ENCOUNTER — Encounter: Payer: PPO | Attending: Psychology | Admitting: Psychology

## 2019-03-27 ENCOUNTER — Encounter: Payer: Self-pay | Admitting: Psychology

## 2019-03-27 DIAGNOSIS — R479 Unspecified speech disturbances: Secondary | ICD-10-CM

## 2019-03-27 DIAGNOSIS — F0150 Vascular dementia without behavioral disturbance: Secondary | ICD-10-CM | POA: Diagnosis not present

## 2019-03-27 NOTE — Progress Notes (Signed)
Neuropsychological Consultation   Patient:   Gregory Rodgers   DOB:   1927/06/01  MR Number:  [id number]  Location:  [facility] FOR PAIN AND [facility] MEDICINE [facility] PHYSICAL MEDICINE AND REHABILITATION [place], [place] [id number] [place] [address] Dept: [phone]           Date of Service:   03/27/2019  Start Time:   9 AM End Time:   11 AM  Today's visit was a 1 hour in person clinical interview with the patient, his wife and myself present in my outpatient clinic office.  The second hour was used for records review and report writing.  Provider/Observer:  Ilean Skill, Psy.D.       Clinical Neuropsychologist       Billing Code/Service: [id number], [phone]  Chief Complaint:    Gregory Stabler. Rodgers is an 85 year old male referred by Frann Rider, NP with [name].  The patient has a history of MCA stroke on 06/01/2017.  At that time, MRI reviewed showed multiple left MCA territory infarcts.  The patient has a history of coronary artery disease with previous MI, left bundle branch block, borderline diabetes, hyperlipidemia, history of renal calculi, hypertension, chronic lymphocytic leukemia, congestive heart failure, cardiomyopathy, transient expressive language deficits, confusion, transient visual loss, right lower extremities deficits and impaired memory.  There have been progressive worsening in the patient's cognitive functioning and memory functioning with reports by family members of slowly declining functioning.  There have been significant concerns about the patient's driving ability and the biggest issue for the referral was to determine whether the patient is capable of continuing not to drive.  Reason for Service:  Gregory Glymph. Rodgers is an 85 year old male referred by Frann Rider, NP with [name].  The patient has a history of MCA stroke on 06/01/2017.  At that time, MRI reviewed showed multiple  left MCA territory infarcts.  The patient has a history of coronary artery disease with previous MI, left bundle branch block, borderline diabetes, hyperlipidemia, history of renal calculi, hypertension, chronic lymphocytic leukemia, congestive heart failure, cardiomyopathy, transient expressive language deficits, confusion, transient visual loss, right lower extremities deficits and impaired memory.  There have been progressive worsening in the patient's cognitive functioning and memory functioning with reports by family members of slowly declining functioning.  There have been significant concerns about the patient's driving ability and the biggest issue for the referral was to determine whether the patient is capable of continuing not to drive.  The patient has had significant impairments on various mental status exams conducted over the past couple years.  The patient has been described as having significant expressive aphasia with difficulties with word finding and verbal fluency as well as very low volume and speech.  The patient has significant hearing deficits, changes in gait and unsteady balance and significant periods of transient confusion.  The patient is described as becoming very weak and unable to walk without assistance and having slurred speech.  The patient's wife reports that the patient's speech has become very low in volume and he has significant word finding issues and will often have difficulties coming up with names.  The patient also gets confused about who his wife is and often thinks there are 2 separate people living in the house including his wife and another woman.  He has been confused about his kids and at various times concerned about what they are doing and why they are doing things around the  house.  When speaking with the patient directly the patient reports that he feels like he can drive just fine but does acknowledge that he is not currently driving but still has his  license.  The patient denies any particular symptoms that are going on.  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  The patient was in the hospital from 05/31/2017 until 06/03/2017 following his stroke.  The patient received speech and physical therapy while in the hospital and then had home health provide speech, PT and OT for 4 to 5 weeks.  The patient is reported to be sleeping well unless he is worried about something.  His appetite is described as being generally good.  These changes have been very challenging for the family and in particular his wife who most explained things multiple times in the family has to use diversion and other distractions to move on to a different subject matter.  These difficulties have intensified over the past year.  Current Status:  Expressive aphasia, difficulty walking, forgetfulness, not understanding conversations at times with significant waxing and waning, becoming angry easily which is very different from his usual behavioral style.  Behavioral Observation: Gregory Rodgers  presents as a 85 y.o.-year-old Right Caucasian Male who appeared his stated age. his dress was Appropriate and he was Well Groomed and his manners were Appropriate to the situation.  his participation was indicative of Inattentive, Redirectable and Resistant behaviors.  There were any physical disabilities noted.  he displayed an inappropriate level of cooperation and motivation.     Interactions:    Active Inattentive, Redirectable and Resistant  Attention:   abnormal and attention span appeared shorter than expected for age  Memory:   abnormal; global memory impairment noted  Visuo-spatial:  not examined  Speech (Volume):  low  Speech:   non-fluent aphasia; significant word finding  difficulties particularly for names and other nouns.  Thought Process:  Tangential  Though Content:  WNL; not suicidal and not homicidal  Orientation:   person and time/date  Judgment:   Poor  Planning:   Poor  Affect:    Blunted  Mood:    Dysphoric  Insight:   Lacking  Intelligence:   normal  Marital Status/Living: The patient was born and raised in [place] with 5 siblings.  He currently lives with his wife Mardene Celeste of 62 years and they have 3 grown children age 83, 49 and 52.  Current Employment: The patient is retired.  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  He worked for 43 years in the [employer] before the plant closed.  Hobbies and interests include gardening, woodworking and bicycling in the past.  Substance Use:  No concerns of substance abuse are reported.    Education:   HS Graduate  Medical History:   Past Medical History:  Diagnosis Date  . Basal cell carcinoma    "numerous places"  . Borderline diabetes   . Cardiomyopathy, dilated ([place]) 03/2016   a) EF ~30% with diffuse HK (not simply ICM) ? partially due to LBBB (septal-lateral dyssynchrony). b) f/u Echo after BiV ICD = EF 45-50%  . CLL (chronic lymphocytic leukemia) ([place]) 09/2009  stage 0; on observation  . Coronary artery disease involving native heart without angina pectoris    PTCA of RCA in setting of an MI in 1986-87; no follow-up study since available.  . Essential hypertension   . History of kidney stones   . Hyperlipidemia with target LDL less than 70    With known CAD  . 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 Left bundle branch block (LBBB) on electrocardiogram    Noted as far back as 2008.  . Old inferior wall myocardial infarction 1986/87   Had PTCA most likely of the RCA     Psychiatric History:  No prior psychiatric  history.  Family Med/Psych History:  Family History  Problem Relation Age of Onset  . Sudden Cardiac Death Neg Hx     Risk of Suicide/Violence: virtually non-existent there has not been any significant behavioral disturbance as far as aggression although he does become agitated quite easily when he is challenged or is confused.  Impression/DX:  Gregory Rodgers is an 85 year old male referred by Frann Rider, NP with [name].  The patient has a history of MCA stroke on 06/01/2017.  At that time, MRI reviewed showed multiple left MCA territory infarcts.  The patient has a history of coronary artery disease with previous MI, left bundle branch block, borderline diabetes, hyperlipidemia, history of renal calculi, hypertension, chronic lymphocytic leukemia, congestive heart failure, cardiomyopathy, transient expressive language deficits, confusion, transient visual loss, right lower extremities deficits and impaired memory.  There have been progressive worsening in the patient's cognitive functioning and memory functioning with reports by family members of slowly declining functioning.  There have been significant concerns about the patient's driving ability and the biggest issue for the referral was to determine whether the patient is capable of continuing not to drive.   Disposition/Plan:  We have set the patient up for formal neuropsychological testing.  Initially, we will administer the RBANS neuropsychological battery which will also allow for serial testing if needed over time.  We will also perform various measures to specifically look at attention concentration issues, visual-spatial issues and executive functioning issues.  These will include but not be limited to the DK executive functioning battery including Trail Making Test and verbal fluency measures and other executive functioning measures, the Hooper visual organizational test, and other measures that may be found to  be appropriate to answer the referral question regarding his potential for driving and degree of cognitive deficits.  Diagnosis:    Vascular dementia without behavioral disturbance (HCC)  Transient speech disturbance         Electronically Signed   _______________________ Ilean Skill, Psy.D.

## 2019-04-02 DIAGNOSIS — H6120 Impacted cerumen, unspecified ear: Secondary | ICD-10-CM | POA: Diagnosis not present

## 2019-04-03 ENCOUNTER — Ambulatory Visit (INDEPENDENT_AMBULATORY_CARE_PROVIDER_SITE_OTHER): Payer: PPO | Admitting: Sports Medicine

## 2019-04-03 ENCOUNTER — Other Ambulatory Visit: Payer: Self-pay

## 2019-04-03 ENCOUNTER — Encounter: Payer: Self-pay | Admitting: Sports Medicine

## 2019-04-03 DIAGNOSIS — M79674 Pain in right toe(s): Secondary | ICD-10-CM | POA: Diagnosis not present

## 2019-04-03 DIAGNOSIS — M79675 Pain in left toe(s): Secondary | ICD-10-CM

## 2019-04-03 DIAGNOSIS — B351 Tinea unguium: Secondary | ICD-10-CM

## 2019-04-03 DIAGNOSIS — Z8673 Personal history of transient ischemic attack (TIA), and cerebral infarction without residual deficits: Secondary | ICD-10-CM

## 2019-04-03 DIAGNOSIS — I739 Peripheral vascular disease, unspecified: Secondary | ICD-10-CM

## 2019-04-03 DIAGNOSIS — C9110 Chronic lymphocytic leukemia of B-cell type not having achieved remission: Secondary | ICD-10-CM | POA: Diagnosis not present

## 2019-04-03 DIAGNOSIS — M21371 Foot drop, right foot: Secondary | ICD-10-CM

## 2019-04-03 NOTE — Progress Notes (Signed)
Subjective: Gregory Rodgers is a 85 y.o. male patient seen today in office with complaint of mildly painful thickened and elongated toenails; unable to trim.  Patient denies any changes with medical history since last visit.  No other pedal complaints noted. Patient is also assisted by wife who will be seen this visit as well. Patient Active Problem List   Diagnosis Date Noted  . Nonischemic cardiomyopathy ([place]) 02/13/2019  . ICD (implantable cardioverter-defibrillator) in place 02/13/2019  . Dementia ([place]) 12/17/2018  . Near syncope 11/02/2017  . History of skin cancer 09/23/2017  . Transient speech disturbance 06/01/2017  . 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 Cerebral embolism with cerebral infarction 06/01/2017  . Polymorphic ventricular tachycardia ([place]) 01/09/2017  . NSVT (nonsustained ventricular tachycardia) ([place]) 05/01/2016  . Left bundle branch block (LBBB) on electrocardiogram 02/27/2016  . Dizziness of unknown cause 02/27/2016  . Bradycardia with 41-50 beats per minute 02/27/2016  . Ischemic cardiomyopathy: EF 40-45% reduced to ~30% - post ICD up to 45-50%. 02/27/2016  . Pancytopenia, acquired ([place]) 09/30/2015  . Thrombocytopenia ([place]) 10/01/2014  . 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 CLL (chronic lymphocytic leukemia) ([place]) 06/20/2011  . Hyperlipidemia 06/20/2011  . Essential hypertension 06/20/2011  . Borderline diabetes 06/20/2011  . CAD S/P percutaneous coronary angioplasty 02/26/1985    Current Outpatient Medications on File Prior to Visit  Medication Sig Dispense Refill  . Acetaminophen (TYLENOL 8 HOUR PO) Take by mouth.    . ciprofloxacin (CIPRO) 500 MG tablet TAKE ONE TABLET BY MOUTH TWICE DAILY UNTIL FINISHED    . clopidogrel (PLAVIX) 75 MG tablet Take 1 tablet (75 mg total) by mouth daily. 30 tablet 3  . co-enzyme Q-10 30 MG capsule Take 30 mg by mouth daily.    Marland Kitchen donepezil (ARICEPT) 5 MG tablet Take 1 tablet (5 mg total) by mouth at  bedtime. 30 tablet 2  . ibuprofen (ADVIL) 200 MG tablet Take 200 mg by mouth as needed.    Marland Kitchen lisinopril (PRINIVIL,ZESTRIL) 10 MG tablet Take 1 tablet (10 mg total) by mouth daily. PLEASE CONTACT OFFICE FOR ADDITIONAL REFILLS 15 tablet 0  . MEGARED OMEGA-3 KRILL OIL 500 MG CAPS Take 1 capsule by mouth daily.    . metoprolol succinate (TOPROL-XL) 25 MG 24 hr tablet TAKE ONE TABLET BY MOUTH ONCE DAILY 30 tablet 9  . Multiple Vitamin (MULTIVITAMIN) tablet Take 1 tablet by mouth daily.    Marland Kitchen omeprazole (PRILOSEC) 20 MG capsule     . pravastatin (PRAVACHOL) 10 MG tablet Take 10 mg by mouth 3 (three) times a week.     . tamsulosin (FLOMAX) 0.4 MG CAPS capsule Take 0.4 mg by mouth daily.     No current facility-administered medications on file prior to visit.    Allergies  Allergen Reactions  . Tape Other (See Comments)    "PLASTIC" TAPE PULLS OFF THE SKIN AND THE REMOVAL OF ALL TAPES BRUISE HIM!!!    Objective: Physical Exam  General: Well developed, nourished, no acute distress, awake, alert and oriented x 3  Vascular: Dorsalis pedis artery 1/4 bilateral, Posterior tibial artery 0/4 bilateral, skin temperature warm to warm proximal to distal bilateral lower extremities, ++ varicosities, 1+ pitting edema bilateral, diminished pedal hair present bilateral.  Neurological: Gross sensation present via light touch bilateral.   Dermatological: Skin is warm, dry, and supple bilateral, Nails 1-10 are tender, long, thick, and discolored with mild subungal debris, no webspace macerations present bilateral, no open lesions present bilateral, no callus/corns/hyperkeratotic  tissue present bilateral. No signs of infection bilateral.  Musculoskeletal: + foot drop and weakness on right.  Assessment and Plan:  Problem List Items Addressed This Visit    None    Visit Diagnoses    Pain due to onychomycosis of toenails of both feet    -  Primary   PVD (peripheral vascular disease) ([place])       Foot drop,  right       History of stroke         -Examined patient.  -Discussed treatment options for painful mycotic nails. -Mechanically debrided and reduced mycotic nails with sterile nail nipper and dremel nail file without incident. -Continue with AFO brace and supportive shoes like before -Patient to return in 3 months for follow up evaluation or sooner if symptoms worsen.  Landis Martins, DPM

## 2019-04-06 ENCOUNTER — Encounter: Payer: PPO | Admitting: Psychology

## 2019-04-09 ENCOUNTER — Ambulatory Visit (INDEPENDENT_AMBULATORY_CARE_PROVIDER_SITE_OTHER): Payer: PPO | Admitting: *Deleted

## 2019-04-09 DIAGNOSIS — I255 Ischemic cardiomyopathy: Secondary | ICD-10-CM | POA: Diagnosis not present

## 2019-04-09 LAB — CUP PACEART REMOTE DEVICE CHECK
Battery Remaining Longevity: 56 mo
Battery Remaining Percentage: 66 %
Battery Voltage: 2.96 V
Brady Statistic AP VP Percent: 27 %
Brady Statistic AP VS Percent: 1 %
Brady Statistic AS VP Percent: 72 %
Brady Statistic AS VS Percent: 1 %
Brady Statistic RA Percent Paced: 26 %
Date Time Interrogation Session: 20201223040017
HighPow Impedance: 65 Ohm
HighPow Impedance: 65 Ohm
Implantable Lead Implant Date: 20180926
Implantable Lead Implant Date: 20180926
Implantable Lead Implant Date: 20180926
Implantable Lead Location: 753858
Implantable Lead Location: 753859
Implantable Lead Location: 753860
Implantable Lead Model: 7122
Implantable Pulse Generator Implant Date: 20180926
Lead Channel Impedance Value: 480 Ohm
Lead Channel Impedance Value: 480 Ohm
Lead Channel Impedance Value: 730 Ohm
Lead Channel Pacing Threshold Amplitude: 0.5 V
Lead Channel Pacing Threshold Amplitude: 0.5 V
Lead Channel Pacing Threshold Amplitude: 1.25 V
Lead Channel Pacing Threshold Pulse Width: 0.5 ms
Lead Channel Pacing Threshold Pulse Width: 0.5 ms
Lead Channel Pacing Threshold Pulse Width: 0.8 ms
Lead Channel Sensing Intrinsic Amplitude: 1.8 mV
Lead Channel Sensing Intrinsic Amplitude: 11.7 mV
Lead Channel Setting Pacing Amplitude: 2 V
Lead Channel Setting Pacing Amplitude: 2.5 V
Lead Channel Setting Pacing Amplitude: 2.5 V
Lead Channel Setting Pacing Pulse Width: 0.5 ms
Lead Channel Setting Pacing Pulse Width: 0.8 ms
Lead Channel Setting Sensing Sensitivity: 0.5 mV
Pulse Gen Serial Number: 9764296

## 2019-04-11 NOTE — Progress Notes (Signed)
ICD remote 

## 2019-04-13 ENCOUNTER — Telehealth: Payer: Self-pay | Admitting: Adult Health

## 2019-04-13 NOTE — Telephone Encounter (Signed)
Patient daughter Gregory Rodgers called to inform patient is confused/agitated/anxious at times and would like to know if there is anything he can be prescribed in order to help calm him down.   Please follow up.

## 2019-04-13 NOTE — Telephone Encounter (Signed)
I called Denise.  Pt tried aricept for a week and was not able to tolerate (he become more agitated). He has become more confused, not recognizing his wife some paranoia, hallucination (seeing strange woman in the home when no one there).  He did go to consult eval with Dr. Sima Matas.  Wife decided not to proceed with testing (pt agitated after consult).  Asking for medication to calm him down.  I offered appt for 1045 tomorrow and they could not make this.  I stated that I would send message but would most likely need to be seen for additional medications since last seen 8/20.

## 2019-04-13 NOTE — Telephone Encounter (Signed)
Patient will need appointment prior to initiating any other medications.  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

## 2019-04-14 NOTE — Telephone Encounter (Signed)
I called daughter back.  Made appt for 04-15-19 0815.  (no appt on sethi schedule).

## 2019-04-15 ENCOUNTER — Ambulatory Visit: Payer: Self-pay | Admitting: Adult Health

## 2019-04-28 DIAGNOSIS — C9110 Chronic lymphocytic leukemia of B-cell type not having achieved remission: Secondary | ICD-10-CM | POA: Diagnosis not present

## 2019-05-04 ENCOUNTER — Other Ambulatory Visit: Payer: Self-pay

## 2019-05-04 ENCOUNTER — Ambulatory Visit: Payer: PPO | Admitting: Adult Health

## 2019-05-04 ENCOUNTER — Encounter: Payer: Self-pay | Admitting: Adult Health

## 2019-05-04 VITALS — BP 150/59 | HR 51 | Temp 97.7°F | Ht 73.0 in | Wt 182.4 lb

## 2019-05-04 DIAGNOSIS — I63312 Cerebral infarction due to thrombosis of left middle cerebral artery: Secondary | ICD-10-CM | POA: Diagnosis not present

## 2019-05-04 DIAGNOSIS — F0151 Vascular dementia with behavioral disturbance: Secondary | ICD-10-CM

## 2019-05-04 DIAGNOSIS — R4189 Other symptoms and signs involving cognitive functions and awareness: Secondary | ICD-10-CM | POA: Diagnosis not present

## 2019-05-04 DIAGNOSIS — E785 Hyperlipidemia, unspecified: Secondary | ICD-10-CM | POA: Diagnosis not present

## 2019-05-04 DIAGNOSIS — F01518 Vascular dementia, unspecified severity, with other behavioral disturbance: Secondary | ICD-10-CM

## 2019-05-04 DIAGNOSIS — I10 Essential (primary) hypertension: Secondary | ICD-10-CM | POA: Diagnosis not present

## 2019-05-04 MED ORDER — DIVALPROEX SODIUM 125 MG PO CSDR: 125.0000 mg | DELAYED_RELEASE_CAPSULE | Freq: Every day | ORAL | 3 refills | Status: DC

## 2019-05-04 NOTE — Progress Notes (Signed)
[name] [address] [place] [address] [phone]       OFFICE FOLLOW UP NOTE  [name] Date of Birth:  31-Jan-1928 Medical Record Number:  [id number]   Reason for Referral:  stroke follow up  CHIEF COMPLAINT:  Chief Complaint  Patient presents with  . Follow-up    Wife and daughter present. Rm 9. Patient's wife mentioned that his memory has rapidly declined. Donepezil made him agitated.     HPI: Stroke admission 06/01/2017: [name] is a 86 y.o. male with history of coronary artery disease with previous MI, left bundle branch block, borderline diabetes, hyperlipidemia, history of renal calculi, hypertension, chronic lymphocytic leukemia, CHF, cardiomyopathy with symptomatic nonsustained V. tach, and status post placement of a biventricular pacemaker/AICD presenting with transient speech problems, confusion, transient visual loss, and right lower extremity weakness. He did not receive IV t-PA due to late presentation.  MRI brain reviewed and showed multiple left MCA territory infarcts.  MRA head/neck showed nonocclusive filling defect in the left carotid bulb compatible with thrombus.  2D echo showed EF of 40-55% but no cardiac source of emboli was identified.  TEE did show a large PFO. LDL 81 and A1c 6.0.  Patient was previously on aspirin 81 mg PTA and was recommended to take aspirin 81 mg and Plavix 75 mg daily..  Recommended for patient to start Lipitor 40 mg daily.  Due to left ICA thrombus, he was started on IV heparin in the hospital but switch to aspirin and Plavix at discharge by Dr. Estanislado Pandy.  Patient discharged home with PT and speech therapy.   07/23/17 visit: Since discharge, patient has been doing well and is accompanied today's visit by wife and daughter.  He continues to do physical therapy where he does have 1 more session but will continue speech therapy for 2 more weeks.  He did fall recently and fell on right arm  and was seen at an urgent care.   he was complaining of right shoulder pain/right arm pain but this has been improving over time.  Blood pressure slightly elevated at 151/60 but they do check blood pressure at home and typically runs SBP 130-140.  Wife is requesting orthotic  for leg length discrepancy as recommended by PT.  Patient's PCP recently closed down and is undergoing process of finding a new PCP.  No further complaints at this time.  No new or worsening stroke/TIA symptoms.  Visit 11/26/17: Patient is being seen today for stroke follow-up and is accompanied by his wife and daughter.  He continues to have some complaints of dysarthria but states this does wax and wane.  Unable to correlate worsening of speech due to fatigue or stress.  He continues to do speech exercises at home on his own.  He continues Plavix with bruising but no bleeding.  Lipitor was stopped by PCP after patient was complaining of abdominal pain and was found to have increased liver function panel.  He was started on pravastatin at that time and due to continued myalgias it was recommended to take pravastatin 10 mg 3 times weekly.  At today's appointment, he was continuing to complain of shoulder pain and abdominal pain.  Unable to see PCP notes or lab work that was obtained by PCP, but wife and daughter both stated that LFTs normalized after stopping of Lipitor.  Recommended to stop pravastatin for possible side effect of myalgias.  If myalgias resolve, recommended to have repeat lab work  at follow-up appointment with PCP in October and continue close follow-up with lipid panel.  If myalgias do not resolve, recommended to restart pravastatin and follow-up with PCP as myalgias may be due to to underlying condition.  Blood pressure today mildly elevated at 142/60 and wife states she occasionally check this at home and is typically SBP 130s.  Denies new or worsening stroke/TIA symptoms. On a side note, wife states that he has occasional  vertigo which she has had for 20+ years.  Patient is unsure if this vertigo occurs with position change or rapid head movement.  Wife believes that this is when he complains of increased dizziness sensation after he bends down to pick something up or with position change.  As the symptoms are only on occasion, recommended to avoid rapid position changes or head movement.  Advised that if these become more frequent or intolerable, patient can try PT for possible BPPV.  Visit 06/02/2018: [name] is being seen today for follow-up visit and is accompanied by his wife and daughter.  Wife has concerns of 2-week history of transient confusion episodes such as when he was driving to his son's house with his wife, he asked his wife if she had ever been down that road before and at times will talk to his wife without acknowledging who she is.  She denies any prior memory concerns or confusion states except for when he was admitted for a stroke 1 year prior.  He continues to have residual deficits of expressive aphasia but this has been stable without any worsening.  He did have CT head obtained by PCP on 05/15/2018 which was negative for acute abnormalities.  Namenda was initiated and wife is questioning whether this needs to be continued.  These episodes will last for a few minutes and patient does not appear to remember when these confusional states occur.  Denies loss of consciousness or headaches at this time or any other neurological deficit.  Besides his episodes occurring, he has been stable and continues to maintain ADLs and some IADLs independently.  He has continued on Plavix without side effects of bleeding or bruising.  His PCP recently restarted him on pravastatin for HDL management.  This was discontinued after prior visit due to possible statin myalgias and per wife, after repeating lipid panel, pravastatin was reinitiated at lower dose.  Blood pressure slightly elevated 146/53 but continues to be monitored  at home and typically 130s/80s.  Loop recorder has not shown atrial fibrillation thus far.  No further concerns at this time.  12/01/2018 update: [name] is a 86 year old male who is being seen today for stroke follow-up accompanied by his daughter.  Concerns today regarding cognitive/memory which has been slowly declining since prior visit.  She does endorse episodes of waxing/waning of memory.  Does state that he is having a good day today.  MMSE 14/30 at today's visit.  Daughter concerned regarding patient continue driving.  Continues to live with his wife maintaining ADLs independently but they do have assistance for bill paying and cooking.  He does stay active around his house and ambulates without assistive device.  Denies depression/anxiety symptoms.  Denies any other neurological concerns.  Continues on clopidogrel and pravastatin for secondary stroke prevention without reported side effects.  Blood pressure satisfactory at 130/59.  Loop recorder is not shown atrial fibrillation thus far.  No further concerns at this time.  Update 05/04/2019: [name] is a 86 year old male who is being seen today per family  request due to worsening cognition.  He is accompanied today by his daughter and wife.  Daughter called into the office due to increased confusion, not recognizing family members, paranoia and hallucinations.  Attempted use of Aricept but unable to tolerate.  Also obtain EEG which showed generalized slowing but no seizure activity.  Dementia panel obtained which was unremarkable.  He did have initial evaluation with Dr. Sima Matas but apparently wife did not wish to pursue additional testing due to increased agitation after this visit.  Daughter reports cognition and memory has been slowly declining and denies any type of rapid onset.  Symptoms wax/wane.  Unable to complete MMSE at today's visit.  He continues to live with his wife but at times she endorses difficulty due to being unable to redirect  patient or with increased agitation or irritation.  Reports activity throughout the day and stays active doing different activities around his house.  He continues on Plavix and pravastatin for secondary stroke prevention of side effects.  Blood pressure today 150/59.  Loop recorder is not shown atrial fibrillation thus far.  No further concerns.     ROS:   14 system review of systems performed and negative with exception of see HPI   PMH:  Past Medical History:  Diagnosis Date  . Basal cell carcinoma    "numerous places"  . Borderline diabetes   . Cardiomyopathy, dilated ([facility]) 03/2016   a) EF ~30% with diffuse HK (not simply ICM) ? partially due to LBBB (septal-lateral dyssynchrony). b) f/u Echo after BiV ICD = EF 45-50%  . CLL (chronic lymphocytic leukemia) ([facility]) 09/2009   stage 0; on observation  . Coronary artery disease involving native heart without angina pectoris    PTCA of RCA in setting of an MI in 1986-87; no follow-up study since available.  . Essential hypertension   . History of kidney stones   . Hyperlipidemia with target LDL less than 70    With known CAD  . 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 Left bundle branch block (LBBB) on electrocardiogram    Noted as far back as 2008.  . Old inferior wall myocardial infarction 1986/87   Had PTCA most likely of the RCA    PSH:  Past Surgical History:  Procedure Laterality Date  . BASAL CELL CARCINOMA EXCISION     "numerous"  . BIV ICD INSERTION CRT-D N/A 01/09/2017   Procedure: BIV ICD INSERTION CRT-D;  Surgeon: Evans Lance, MD;  Location: [facility];  Service: Cardiovascular;  Laterality: N/A;  . CATARACT EXTRACTION W/ INTRAOCULAR LENS  IMPLANT, BILATERAL Bilateral   . CORONARY ANGIOPLASTY  1986/1987  . defibllator    . dual pacemaker    . EP IMPLANTABLE DEVICE N/A 05/10/2016   Procedure: Loop Recorder Insertion;  Surgeon: Evans Lance, MD;  Location: [facility];   Service: Cardiovascular;  Laterality: N/A;  slight blood drop on dressing  . JOINT REPLACEMENT    . MOHS SURGERY Left 01/02/2017   ear  . REPLACEMENT TOTAL KNEE Left   . TEE WITHOUT CARDIOVERSION N/A 06/03/2017   Procedure: TRANSESOPHAGEAL ECHOCARDIOGRAM (TEE) - cancelled -   . TRANSTHORACIC ECHOCARDIOGRAM  03/2016   Normal LV size with mild LV hypertrophy. EF 30%. Diffuse  hypokinesis wtih septal-lateral dyssynchrony. Normal RV size and systolic function. Mild MR and mild AI.  Marland Kitchen TRANSTHORACIC ECHOCARDIOGRAM  06/03/2017   for CVA evaluation.  Diffuse hypokinesis  EF 45 to 50% with moderate focal basal septal hypertrophy.  Moderate aortic insufficiency.  Large PFO with positive bubble study. -Possible source of embolus.  Ascending aortic aneurysm unchanged.    Social History:  Social History   Socioeconomic History  . Marital status: Married    Spouse name: Not on file  . Number of children: Not on file  . Years of education: Not on file  . Highest education level: Not on file  Occupational History  . Not on file  Tobacco Use  . Smoking status: Never Smoker  . Smokeless tobacco: Never Used  Substance and Sexual Activity  . Alcohol use: No  . Drug use: No  . Sexual activity: Not on file  Other Topics Concern  . Not on file  Social History Narrative  . Not on file   Social Determinants of Health   Financial Resource Strain:   . Difficulty of Paying Living Expenses: Not on file  Food Insecurity:   . Worried About Charity fundraiser in the Last Year: Not on file  . Ran Out of Food in the Last Year: Not on file  Transportation Needs:   . Lack of Transportation (Medical): Not on file  . Lack of Transportation (Non-Medical): Not on file  Physical Activity:   . Days of Exercise per Week: Not on file  . Minutes of Exercise per Session: Not on file  Stress:   . Feeling of Stress : Not on file  Social Connections:   . Frequency of Communication with Friends and Family: Not on  file  . Frequency of Social Gatherings with Friends and Family: Not on file  . Attends Religious Services: Not on file  . Active Member of Clubs or Organizations: Not on file  . Attends Archivist Meetings: Not on file  . Marital Status: Not on file  Intimate Partner Violence:   . Fear of Current or Ex-Partner: Not on file  . Emotionally Abused: Not on file  . Physically Abused: Not on file  . Sexually Abused: Not on file    Family History:  Family History  Problem Relation Age of Onset  . Sudden Cardiac Death Neg Hx     Medications:   Current Outpatient Medications on File Prior to Visit  Medication Sig Dispense Refill  . Acetaminophen (TYLENOL 8 HOUR PO) Take by mouth.    . clopidogrel (PLAVIX) 75 MG tablet Take 1 tablet (75 mg total) by mouth daily. 30 tablet 3  . co-enzyme Q-10 30 MG capsule Take 30 mg by mouth daily.    Marland Kitchen ibuprofen (ADVIL) 200 MG tablet Take 200 mg by mouth as needed.    Marland Kitchen lisinopril (PRINIVIL,ZESTRIL) 10 MG tablet Take 1 tablet (10 mg total) by mouth daily. PLEASE CONTACT OFFICE FOR ADDITIONAL REFILLS 15 tablet 0  . MEGARED OMEGA-3 KRILL OIL 500 MG CAPS Take 1 capsule by mouth daily.    . metoprolol succinate (TOPROL-XL) 25 MG 24 hr tablet TAKE ONE TABLET BY MOUTH ONCE DAILY 30 tablet 9  . Multiple Vitamin (MULTIVITAMIN) tablet Take 1 tablet by mouth daily.    Marland Kitchen omeprazole (PRILOSEC) 20 MG capsule     . pravastatin (PRAVACHOL) 10 MG tablet Take 10 mg by mouth 3 (three) times a week.     . tamsulosin (FLOMAX) 0.4 MG CAPS capsule Take 0.4 mg by mouth daily.    Marland Kitchen donepezil (ARICEPT) 5 MG tablet Take 1 tablet (5 mg total) by mouth at bedtime. (Patient not  taking: Reported on 05/04/2019) 30 tablet 2   No current facility-administered medications on file prior to visit.    Allergies:   Allergies  Allergen Reactions  . Tape Other (See Comments)    "PLASTIC" TAPE PULLS OFF THE SKIN AND THE REMOVAL OF ALL TAPES BRUISE HIM!!!     Physical  Exam  Vitals:   05/04/19 1242  BP: (!) 150/59  Pulse: (!) 51  Temp: 97.7 F (36.5 C)  TempSrc: Oral  Weight: 182 lb 6.4 oz (82.7 kg)  Height: 6\' 1"  (1.854 m)   Body mass index is 24.06 kg/m. No exam data present  MMSE - Mini Mental State Exam 05/04/2019 12/01/2018  Not completed: Unable to complete -  Orientation to time - 4  Orientation to Place - 3  Registration - 1  Attention/ Calculation - 0  Recall - 0  Language- name 2 objects - 2  Language- repeat - 0  Language- follow 3 step command - 3  Language- read & follow direction - 0  Write a sentence - 1  Copy design - 0  Total score - 14    General: well developed, pleasant elderly Caucasian male seated, in no evident distress Head: head normocephalic and atraumatic.   Neck: supple with no carotid or supraclavicular bruits Cardiovascular: regular rate and rhythm, no murmurs Musculoskeletal: no deformity Skin:  no rash/petichiae Vascular:  Normal pulses all extremities  Neurologic Exam Mental Status: Awake and fully alert.  Hypophonia and word finding difficulty.  Oriented to place and time. Recent and remote memory diminished.  Attention span, concentration and fund of knowledge mildly diminished. Mood and affect appropriate.   Cranial Nerves: Pupils equal, briskly reactive to light. Extraocular movements full without nystagmus. Visual fields full to confrontation.  Severely HOH bilaterally. Facial sensation intact. Face, tongue, palate moves normally and symmetrically.  Motor: Normal bulk and tone. Normal strength in all tested extremity muscles. Sensory.: intact to touch , pinprick , position and vibratory sensation.  Coordination: Rapid alternating movements normal in all extremities. Finger-to-nose and heel-to-shin performed accurately bilaterally. Gait and Station: Arises from chair without difficulty. Stance is normal. Gait demonstrates normal stride length and balance .  Reflexes: 1+ and symmetric. Toes downgoing.      Diagnostic Data (Labs, Imaging, Testing)  CT head 05/15/2018 IMPRESSION: 1. No acute finding or change from February 2019. 2. Brain atrophy and remote small vessel infarcts. 3. Vertebrobasilar dolichoectasia.    Mr Gregory Rodgers Neck W Wo Contrast Mr Gregory Rodgers Wo Contrast 06/01/2017   IMPRESSION:  Multiple small acute infarcts in the left MCA territory.  Irregular plaque proximal left internal carotid artery with 60% diameter narrowing.  Filling defect in the left carotid bulb compatible with thrombus.  This is likely a cause of embolic infarctions in the left MCA territory.  Atrophy and chronic microvascular ischemia.  No intracranial large vessel occlusion.    Transthoracic Echocardiogram  06/01/2017 Study Conclusions - Left ventricle: The cavity size was normal. There was severe focal basal hypertrophy of the septum with otherwise mild concentric hypertrophy. Systolic function was mildly to moderately reduced. The estimated ejection fraction was in the range of 40% to 45%. Wall motion was normal; there were no regional wall motion abnormalities. Doppler parameters are consistent with abnormal left ventricular relaxation (grade 1 diastolic dysfunction). - Aortic valve: Transvalvular velocity was within the normal range. There was no stenosis. There was moderate regurgitation. Regurgitation pressure half-time: 348 ms. - Aorta: Ascending aortic diameter: 42 mm (S). - Ascending  aorta: The ascending aorta was mildly dilated. - Mitral valve: Transvalvular velocity was within the normal range. There was no evidence for stenosis. There was mild regurgitation. - Left atrium: The atrium was mildly dilated. - Right ventricle: The cavity size was normal. Wall thickness was normal. Systolic function was normal. - Tricuspid valve: There was trivial regurgitation. Impressions: - Compared with the prior echo [id number], systolic function has improved. The ascending  aortic aneurysm is unchanged.  Echo TEE Result date: 06/04/2017 Study Conclusions  is- Left ventricle: Normal LV size with moderate focal basal septal hypertrophy. EF 45-50%, diffuse hypokinesis. - Aortic valve: There was no stenosis. There was moderate regurgitation. - Aorta: Normal caliber but tortuous thoracic aorta with grade III plaque in the descending thoracic aorta. - Mitral valve: There was trivial regurgitation. - Left atrium: The atrium was mildly dilated. No evidence of thrombus in the atrial cavity or appendage. - Right ventricle: The cavity size was normal. Pacer wire or catheter noted in right ventricle. Systolic function was normal. - Right atrium: No evidence of thrombus in the atrial cavity or appendage. - Atrial septum: There was a large PFO, bubble study markedly positive. Impressions: - PFO is possible source of embolus. Left MCA   ASSESSMENT: Gregory Rodgers is a 86 y.o. year old male here with stroke on 06/01/2017 secondary to left ICA thrombus. Vascular risk factors include HTN, HLD and CAD.  Stable from a stroke standpoint.  Loop recorder has not shown atrial fibrillation thus far.  Patient being seen today per family request due to worsening memory and cognition with slow decline over the past year   PLAN: -Difficulty tolerating Aricept and Namenda in the past.  Recommend initiating Depakote 125 mg nightly to assist with behaviors as prior cognitive impairment slowly declining to likely vascular dementia -Provided family with additional vascular dementia information as well as caregiver resources and discussed importance of safety.  If symptoms persist, may need referral to geriatric psychiatry for further management. -Continue clopidogrel 75 mg daily  and pravastatin for secondary stroke prevention -F/u with PCP regarding your HLD, HTN and CAD management -Continue to monitor loop recorder for atrial fibrillation -continue to monitor BP at  home -Advised to continue to stay active and maintain a healthy diet -Maintain strict control of hypertension with blood pressure goal below 130/90, diabetes with hemoglobin A1c goal below 6.5% and cholesterol with LDL cholesterol (bad cholesterol) goal below 70 mg/dL. I also advised the patient to eat a healthy diet with plenty of whole grains, cereals, fruits and vegetables, exercise regularly and maintain ideal body weight.  Follow-up in 3 months or call earlier if needed  Greater than 50% time during this 35 minute visit was spent on counseling and coordination of care regarding memory loss/cognitive decline likely related to vascular dementia, discussion regarding ongoing risk factor management with risk factors of HLD and HTN and answered all questions to patient and daughter satisfaction   Frann Rider, [facility]  [facility] [address] [place], [place] [address]  Phone [phone] Fax [phone]

## 2019-05-04 NOTE — Patient Instructions (Signed)
Recommend initiating Depakote 125 mg daily (can be given in the evening or at bedtime) to help with behaviors.  Please call office after 1-2 weeks for possible need of dosage increase or call sooner if difficulty tolerating.  Additional information regarding possible side effects of Cipro  Continue clopidogrel 75 mg daily  and pravastatin for secondary stroke prevention  Continue to follow up with PCP regarding cholesterol and blood pressure management   Continue to monitor blood pressure at home  Maintain strict control of hypertension with blood pressure goal below 130/90, diabetes with hemoglobin A1c goal below 6.5% and cholesterol with LDL cholesterol (bad cholesterol) goal below 70 mg/dL. I also advised the patient to eat a healthy diet with plenty of whole grains, cereals, fruits and vegetables, exercise regularly and maintain ideal body weight.  Followup in the future with me in 3 months or call earlier if needed       Thank you for coming to see [place] at [facility]. I hope we have been able to provide you high quality care today.  You may receive a patient satisfaction survey over the next few weeks. We would appreciate your feedback and comments so that we may continue to improve ourselves and the health of our patients.   Valproic Acid, Divalproex Sodium delayed or extended-release tablets What is this medicine? DIVALPROEX SODIUM (dye VAL pro ex SO dee um) is used to prevent seizures caused by some forms of epilepsy. It is also used to treat bipolar mania and to prevent migraine headaches. This medicine may be used for other purposes; ask your health care provider or pharmacist if you have questions. COMMON BRAND NAME(S): Depakote, Depakote ER What should I tell my health care provider before I take this medicine? They need to know if you have any of these conditions:  if you often drink alcohol  kidney disease  liver disease  low platelet  counts  mitochondrial disease  suicidal thoughts, plans, or attempt; a previous suicide attempt by you or a family member  urea cycle disorder (UCD)  an unusual or allergic reaction to divalproex sodium, sodium valproate, valproic acid, other medicines, foods, dyes, or preservatives  pregnant or trying to get pregnant  breast-feeding How should I use this medicine? Take this medicine by mouth with a drink of water. Follow the directions on the prescription label. Do not cut, crush or chew this medicine. You can take it with or without food. If it upsets your stomach, take it with food. Take your medicine at regular intervals. Do not take it more often than directed. Do not stop taking except on your doctor's advice. A special MedGuide will be given to you by the pharmacist with each prescription and refill. Be sure to read this information carefully each time. Talk to your pediatrician regarding the use of this medicine in children. While this drug may be prescribed for children as young as 10 years for selected conditions, precautions do apply. Overdosage: If you think you have taken too much of this medicine contact a poison control center or emergency room at once. NOTE: This medicine is only for you. Do not share this medicine with others. What if I miss a dose? If you miss a dose, take it as soon as you can. If it is almost time for your next dose, take only that dose. Do not take double or extra doses. What may interact with this medicine? Do not take this medicine with any of the following medications:  sodium phenylbutyrate This medicine may also interact with the following medications:  aspirin  certain antibiotics like ertapenem, imipenem, meropenem  certain medicines for depression, anxiety, or psychotic disturbances  certain medicines for seizures like carbamazepine, clonazepam, diazepam, ethosuximide, felbamate, lamotrigine, phenobarbital, phenytoin, primidone,  rufinamide, topiramate  certain medicines that treat or prevent blood clots like warfarin  cholestyramine  male hormones, like estrogens and birth control pills, patches, or rings  propofol  rifampin  ritonavir  tolbutamide  zidovudine This list may not describe all possible interactions. Give your health care provider a list of all the medicines, herbs, non-prescription drugs, or dietary supplements you use. Also tell them if you smoke, drink alcohol, or use illegal drugs. Some items may interact with your medicine. What should I watch for while using this medicine? Tell your doctor or health care provider if your symptoms do not get better or they start to get worse. This medicine may cause serious skin reactions. They can happen weeks to months after starting the medicine. Contact your health care provider right away if you notice fevers or flu-like symptoms with a rash. The rash may be red or purple and then turn into blisters or peeling of the skin. Or, you might notice a red rash with swelling of the face, lips or lymph nodes in your neck or under your arms. Wear a medical ID bracelet or chain, and carry a card that describes your disease and details of your medicine and dosage times. You may get drowsy, dizzy, or have blurred vision. Do not drive, use machinery, or do anything that needs mental alertness until you know how this medicine affects you. To reduce dizzy or fainting spells, do not sit or stand up quickly, especially if you are an older patient. Alcohol can increase drowsiness and dizziness. Avoid alcoholic drinks. This medicine can make you more sensitive to the sun. Keep out of the sun. If you cannot avoid being in the sun, wear protective clothing and use sunscreen. Do not use sun lamps or tanning beds/booths. Patients and their families should watch out for new or worsening depression or thoughts of suicide. Also watch out for sudden changes in feelings such as feeling  anxious, agitated, panicky, irritable, hostile, aggressive, impulsive, severely restless, overly excited and hyperactive, or not being able to sleep. If this happens, especially at the beginning of treatment or after a change in dose, call your health care provider. Women should inform their doctor if they wish to become pregnant or think they might be pregnant. There is a potential for serious side effects to an unborn child. Talk to your health care provider or pharmacist for more information. Women who become pregnant while using this medicine may enroll in the [place] Pregnancy Registry by calling [phone]. This registry collects information about the safety of antiepileptic drug use during pregnancy. This medicine may cause a decrease in folic acid and vitamin D. You should make sure that you get enough vitamins while you are taking this medicine. Discuss the foods you eat and the vitamins you take with your health care provider. What side effects may I notice from receiving this medicine? Side effects that you should report to your doctor or health care professional as soon as possible:  allergic reactions like skin rash, itching or hives, swelling of the face, lips, or tongue  changes in vision  rash, fever, and swollen lymph nodes  redness, blistering, peeling or loosening of the skin, including inside the mouth  signs and symptoms of liver injury like dark yellow or brown urine; general ill feeling or flu-like symptoms; light-colored stools; loss of appetite; nausea; right upper belly pain; unusually weak or tired; yellowing of the eyes or skin  suicidal thoughts or other mood changes  unusual bleeding or bruising Side effects that usually do not require medical attention (report to your doctor or health care professional if they continue or are bothersome):  constipation  diarrhea  dizziness  hair loss  headache  loss of appetite  weight  gain This list may not describe all possible side effects. Call your doctor for medical advice about side effects. You may report side effects to FDA at 1-800-FDA-1088. Where should I keep my medicine? Keep out of reach of children. Store at room temperature between 15 and 30 degrees C (59 and 86 degrees F). Keep container tightly closed. Throw away any unused medicine after the expiration date. NOTE: This sheet is a summary. It may not cover all possible information. If you have questions about this medicine, talk to your doctor, pharmacist, or health care provider.  2020 Elsevier/Gold Standard (2018-07-11 16:03:42)      Dementia Caregiver Guide Dementia is a term used to describe a number of symptoms that affect memory and thinking. The most common symptoms include:  Memory loss.  Trouble with language and communication.  Trouble concentrating.  Poor judgment.  Problems with reasoning.  Child-like behavior and language.  Extreme anxiety.  Angry outbursts.  Wandering from home or public places. Dementia usually gets worse slowly over time. In the early stages, people with dementia can stay independent and safe with some help. In later stages, they need help with daily tasks such as dressing, grooming, and using the bathroom. How to help the person with dementia cope Dementia can be frightening and confusing. Here are some tips to help the person with dementia cope with changes caused by the disease. General tips  Keep the person on track with his or her routine.  Try to identify areas where the person may need help.  Be supportive, patient, calm, and encouraging.  Gently remind the person that adjusting to changes takes time.  Help with the tasks that the person has asked for help with.  Keep the person involved in daily tasks and decisions as much as possible.  Encourage conversation, but try not to get frustrated or harried if the person struggles to find words or  does not seem to appreciate your help. Communication tips  When the person is talking or seems frustrated, make eye contact and hold the person's hand.  Ask specific questions that need yes or no answers.  Use simple words, short sentences, and a calm voice. Only give one direction at a time.  When offering choices, limit them to just 1 or 2.  Avoid correcting the person in a negative way.  If the person is struggling to find the right words, gently try to help him or her. How to recognize symptoms of stress Symptoms of stress in caregivers include:  Feeling frustrated or angry with the person with dementia.  Denying that the person has dementia or that his or her symptoms will not improve.  Feeling hopeless and unappreciated.  Difficulty sleeping.  Difficulty concentrating.  Feeling anxious, irritable, or depressed.  Developing stress-related health problems.  Feeling like you have too little time for your own life. Follow these instructions at home:   Make sure that you and the person you are caring for: ?  Get regular sleep. ? Exercise regularly. ? Eat regular, nutritious meals. ? Drink enough fluid to keep your urine clear or pale yellow. ? Take over-the-counter and prescription medicines only as told by your health care providers. ? Attend all scheduled health care appointments.  Join a support group with others who are caregivers.  Ask about respite care resources so that you can have a regular break from the stress of caregiving.  Look for signs of stress in yourself and in the person you are caring for. If you notice signs of stress, take steps to manage it.  Consider any safety risks and take steps to avoid them.  Organize medications in a pill box for each day of the week.  Create a plan to handle any legal or financial matters. Get legal or financial advice if needed.  Keep a calendar in a central location to remind the person of appointments or other  activities. Tips for reducing the risk of injury  Keep floors clear of clutter. Remove rugs, magazine racks, and floor lamps.  Keep hallways well lit, especially at night.  Put a handrail and nonslip mat in the bathtub or shower.  Put childproof locks on cabinets that contain dangerous items, such as medicines, alcohol, guns, toxic cleaning items, sharp tools or utensils, matches, and lighters.  Put the locks in places where the person cannot see or reach them easily. This will help ensure that the person does not wander out of the house and get lost.  Be prepared for emergencies. Keep a list of emergency phone numbers and addresses in a convenient area.  Remove car keys and lock garage doors so that the person does not try to get in the car and drive.  Have the person wear a bracelet that tracks locations and identifies the person as having memory problems. This should be worn at all times for safety. Where to find support: Many individuals and organizations offer support. These include:  Support groups for people with dementia and for caregivers.  Counselors or therapists.  Home health care services.  Adult day care centers. Where to find more information Alzheimer's Association: [employer] Contact a health care provider if:  The person's health is rapidly getting worse.  You are no longer able to care for the person.  Caring for the person is affecting your physical and emotional health.  The person threatens himself or herself, you, or anyone else. Summary  Dementia is a term used to describe a number of symptoms that affect memory and thinking.  Dementia usually gets worse slowly over time.  Take steps to reduce the person's risk of injury, and to plan for future care.  Caregivers need support, relief from caregiving, and time for their own lives. This information is not intended to replace advice given to you by your health care provider. Make sure you discuss  any questions you have with your health care provider. Document Revised: 03/15/2017 Document Reviewed: 03/06/2016 Elsevier Patient Education  2020 [employer].

## 2019-05-05 NOTE — Progress Notes (Signed)
I agree with the above plan 

## 2019-05-24 IMAGING — CR DG CHEST 2V
2 series · 2 of 2 positions shown · non-contrast
Comparison: 10/29/2004

CLINICAL DATA: Left arm soreness.  Defibrillator placement

EXAM:
CHEST  2 VIEW

[chest lat]
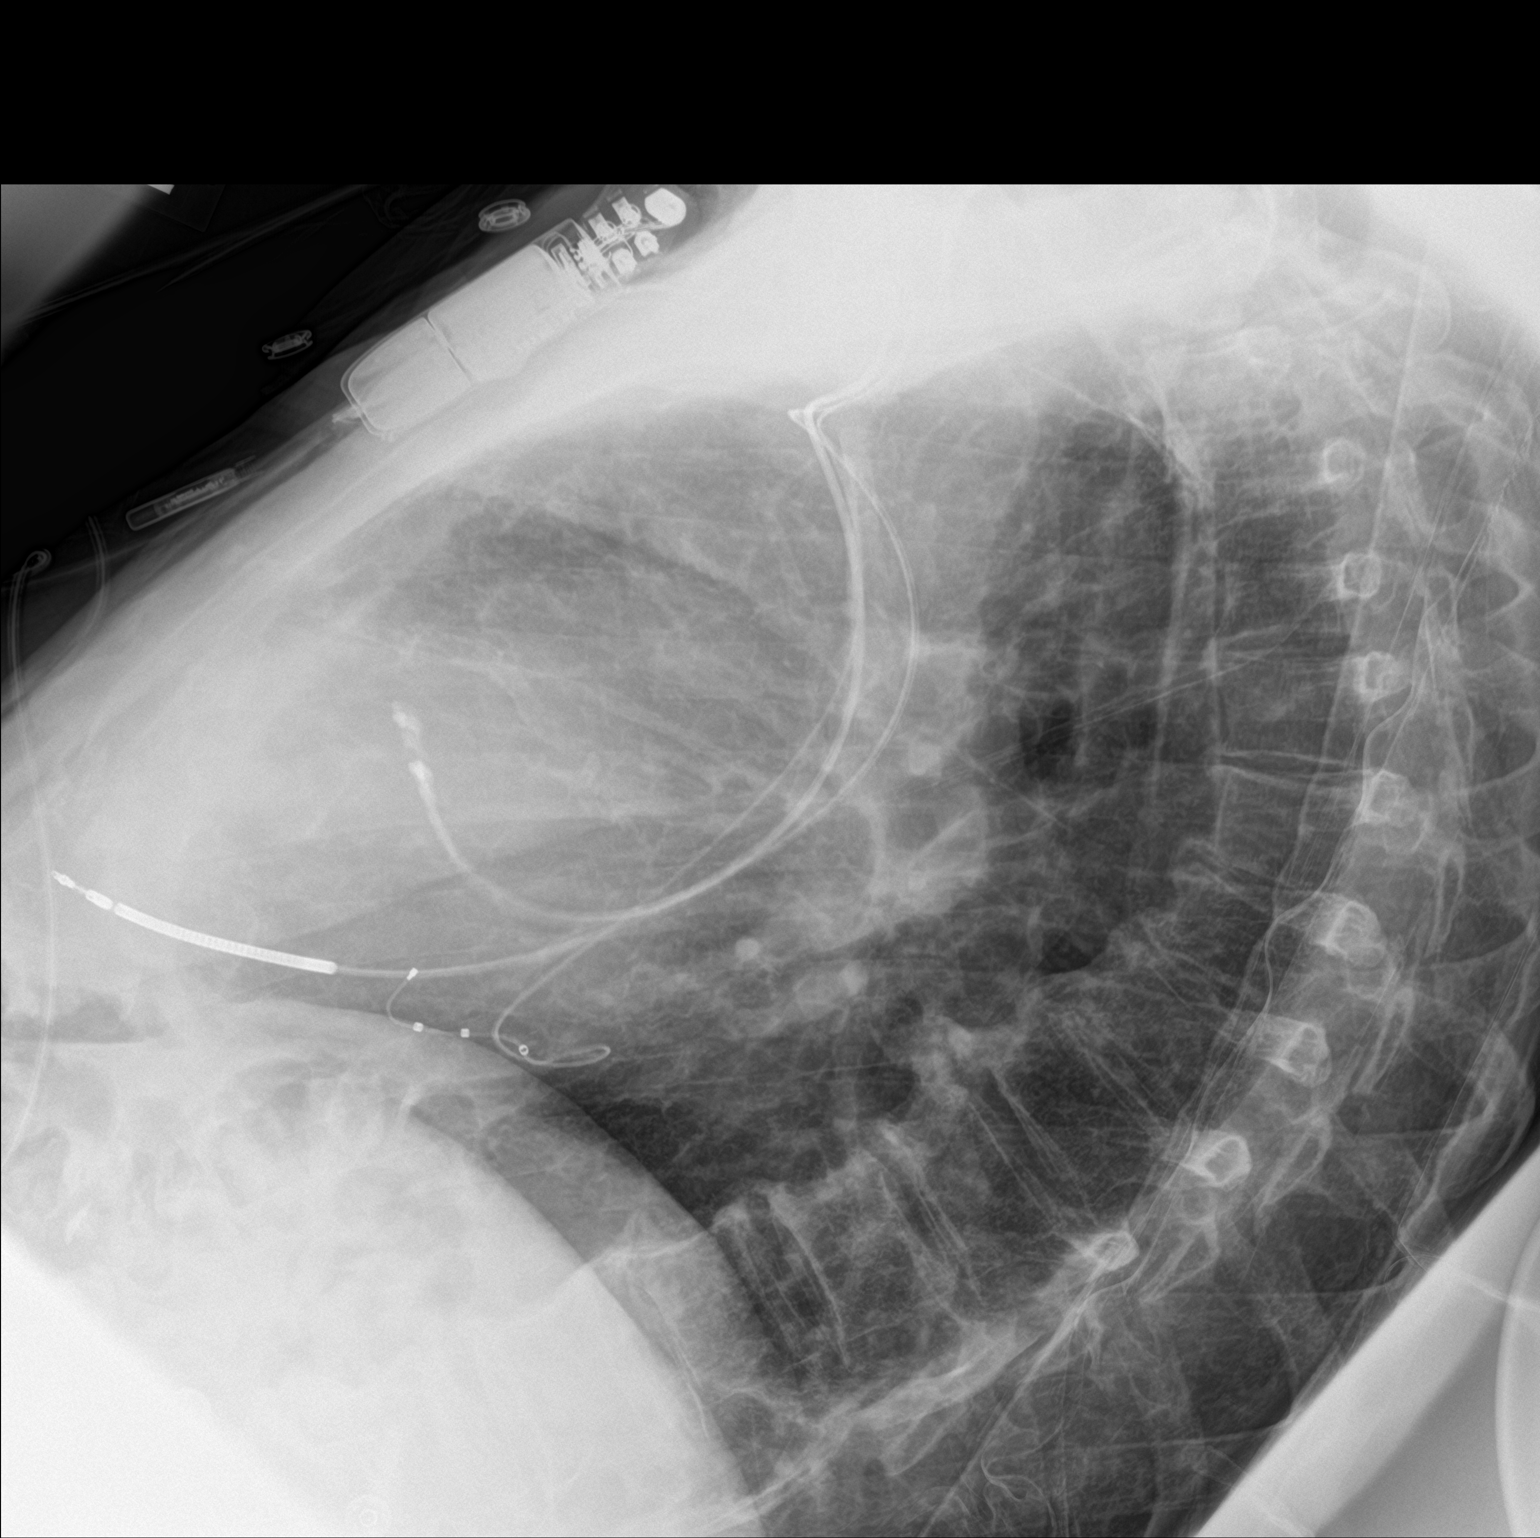

[chest ap]
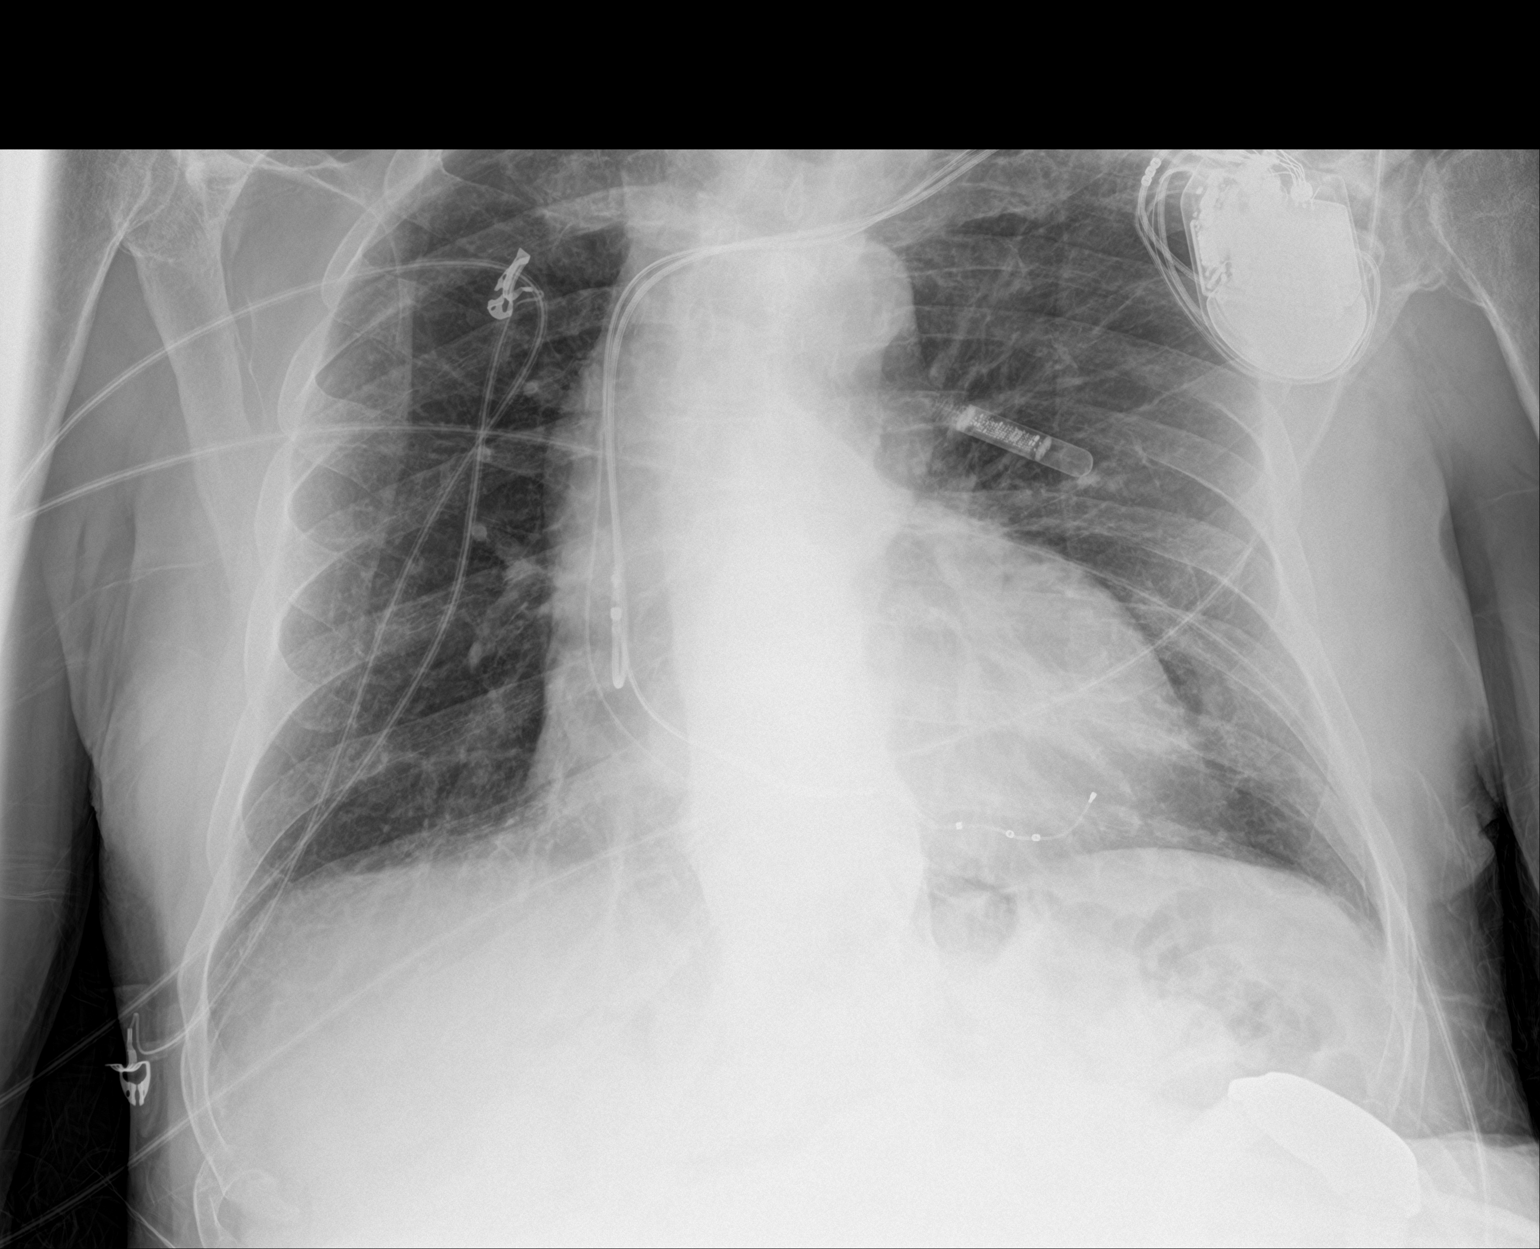

[2 of 2 positions shown; findings below may reference images not displayed]

FINDINGS: Placement of left AICD with leads in the right atrium, right
ventricle and coronary sinus. No pneumothorax. Cardiomegaly. No
confluent airspace opacities or effusions.
IMPRESSION: Left AICD placement without pneumothorax.

Cardiomegaly.  No active disease.

## 2019-07-03 DIAGNOSIS — H6120 Impacted cerumen, unspecified ear: Secondary | ICD-10-CM | POA: Diagnosis not present

## 2019-07-06 ENCOUNTER — Ambulatory Visit: Payer: PPO | Admitting: Psychology

## 2019-07-09 ENCOUNTER — Ambulatory Visit (INDEPENDENT_AMBULATORY_CARE_PROVIDER_SITE_OTHER): Payer: PPO | Admitting: Sports Medicine

## 2019-07-09 ENCOUNTER — Ambulatory Visit (INDEPENDENT_AMBULATORY_CARE_PROVIDER_SITE_OTHER): Payer: PPO | Admitting: *Deleted

## 2019-07-09 ENCOUNTER — Other Ambulatory Visit: Payer: Self-pay

## 2019-07-09 ENCOUNTER — Encounter: Payer: Self-pay | Admitting: Sports Medicine

## 2019-07-09 DIAGNOSIS — M21371 Foot drop, right foot: Secondary | ICD-10-CM | POA: Diagnosis not present

## 2019-07-09 DIAGNOSIS — B351 Tinea unguium: Secondary | ICD-10-CM

## 2019-07-09 DIAGNOSIS — M79675 Pain in left toe(s): Secondary | ICD-10-CM

## 2019-07-09 DIAGNOSIS — M79674 Pain in right toe(s): Secondary | ICD-10-CM | POA: Diagnosis not present

## 2019-07-09 DIAGNOSIS — I739 Peripheral vascular disease, unspecified: Secondary | ICD-10-CM

## 2019-07-09 DIAGNOSIS — I255 Ischemic cardiomyopathy: Secondary | ICD-10-CM

## 2019-07-09 DIAGNOSIS — Z8673 Personal history of transient ischemic attack (TIA), and cerebral infarction without residual deficits: Secondary | ICD-10-CM | POA: Diagnosis not present

## 2019-07-09 LAB — CUP PACEART REMOTE DEVICE CHECK
Battery Remaining Longevity: 52 mo
Battery Remaining Percentage: 62 %
Battery Voltage: 2.96 V
Brady Statistic AP VP Percent: 29 %
Brady Statistic AP VS Percent: 1 %
Brady Statistic AS VP Percent: 70 %
Brady Statistic AS VS Percent: 1 %
Brady Statistic RA Percent Paced: 28 %
Date Time Interrogation Session: 20210325020018
HighPow Impedance: 63 Ohm
HighPow Impedance: 63 Ohm
Implantable Lead Implant Date: 20180926
Implantable Lead Implant Date: 20180926
Implantable Lead Implant Date: 20180926
Implantable Lead Location: 753858
Implantable Lead Location: 753859
Implantable Lead Location: 753860
Implantable Lead Model: 7122
Implantable Pulse Generator Implant Date: 20180926
Lead Channel Impedance Value: 430 Ohm
Lead Channel Impedance Value: 440 Ohm
Lead Channel Impedance Value: 700 Ohm
Lead Channel Pacing Threshold Amplitude: 0.5 V
Lead Channel Pacing Threshold Amplitude: 0.5 V
Lead Channel Pacing Threshold Amplitude: 1.25 V
Lead Channel Pacing Threshold Pulse Width: 0.5 ms
Lead Channel Pacing Threshold Pulse Width: 0.5 ms
Lead Channel Pacing Threshold Pulse Width: 0.8 ms
Lead Channel Sensing Intrinsic Amplitude: 1.6 mV
Lead Channel Sensing Intrinsic Amplitude: 11.7 mV
Lead Channel Setting Pacing Amplitude: 2 V
Lead Channel Setting Pacing Amplitude: 2.5 V
Lead Channel Setting Pacing Amplitude: 2.5 V
Lead Channel Setting Pacing Pulse Width: 0.5 ms
Lead Channel Setting Pacing Pulse Width: 0.8 ms
Lead Channel Setting Sensing Sensitivity: 0.5 mV
Pulse Gen Serial Number: 9764296

## 2019-07-09 NOTE — Progress Notes (Signed)
ICD Remote  

## 2019-07-09 NOTE — Progress Notes (Signed)
Subjective: Gregory Rodgers is a 86 y.o. male patient seen today in office with complaint of mildly painful thickened and elongated toenails; unable to trim.  Patient denies any changes with medical history since last encounter.  No other pedal complaints noted.  Patient is also assisted by wife who will be seen this visit as well. Patient Active Problem List   Diagnosis Date Noted  . Nonischemic cardiomyopathy ([place]) 02/13/2019  . ICD (implantable cardioverter-defibrillator) in place 02/13/2019  . Dementia ([place]) 12/17/2018  . Near syncope 11/02/2017  . History of skin cancer 09/23/2017  . Transient speech disturbance 06/01/2017  . 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 Cerebral embolism with cerebral infarction 06/01/2017  . Polymorphic ventricular tachycardia ([place]) 01/09/2017  . NSVT (nonsustained ventricular tachycardia) ([place]) 05/01/2016  . Left bundle branch block (LBBB) on electrocardiogram 02/27/2016  . Dizziness of unknown cause 02/27/2016  . Bradycardia with 41-50 beats per minute 02/27/2016  . Ischemic cardiomyopathy: EF 40-45% reduced to ~30% - post ICD up to 45-50%. 02/27/2016  . Pancytopenia, acquired ([place]) 09/30/2015  . Thrombocytopenia ([place]) 10/01/2014  . 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 CLL (chronic lymphocytic leukemia) ([place]) 06/20/2011  . Hyperlipidemia 06/20/2011  . Essential hypertension 06/20/2011  . Borderline diabetes 06/20/2011  . CAD S/P percutaneous coronary angioplasty 02/26/1985    Current Outpatient Medications on File Prior to Visit  Medication Sig Dispense Refill  . Acetaminophen (TYLENOL 8 HOUR PO) Take by mouth.    . clopidogrel (PLAVIX) 75 MG tablet Take 1 tablet (75 mg total) by mouth daily. 30 tablet 3  . co-enzyme Q-10 30 MG capsule Take 30 mg by mouth daily.    . divalproex (DEPAKOTE SPRINKLE) 125 MG capsule Take 1 capsule (125 mg total) by mouth daily. 30 capsule 3  . donepezil (ARICEPT) 5 MG tablet Take 1 tablet  (5 mg total) by mouth at bedtime. (Patient not taking: Reported on 05/04/2019) 30 tablet 2  . ibuprofen (ADVIL) 200 MG tablet Take 200 mg by mouth as needed.    Marland Kitchen lisinopril (PRINIVIL,ZESTRIL) 10 MG tablet Take 1 tablet (10 mg total) by mouth daily. PLEASE CONTACT OFFICE FOR ADDITIONAL REFILLS 15 tablet 0  . MEGARED OMEGA-3 KRILL OIL 500 MG CAPS Take 1 capsule by mouth daily.    . metoprolol succinate (TOPROL-XL) 25 MG 24 hr tablet TAKE ONE TABLET BY MOUTH ONCE DAILY 30 tablet 9  . Multiple Vitamin (MULTIVITAMIN) tablet Take 1 tablet by mouth daily.    Marland Kitchen omeprazole (PRILOSEC) 20 MG capsule     . pravastatin (PRAVACHOL) 10 MG tablet Take 10 mg by mouth 3 (three) times a week.     . tamsulosin (FLOMAX) 0.4 MG CAPS capsule Take 0.4 mg by mouth daily.     No current facility-administered medications on file prior to visit.    Allergies  Allergen Reactions  . Tape Other (See Comments)    "PLASTIC" TAPE PULLS OFF THE SKIN AND THE REMOVAL OF ALL TAPES BRUISE HIM!!!    Objective: Physical Exam  General: Well developed, nourished, no acute distress, awake, alert and oriented x 3  Vascular: Dorsalis pedis artery 1/4 bilateral, Posterior tibial artery 0/4 bilateral, skin temperature warm to warm proximal to distal bilateral lower extremities, ++ varicosities, 1+ pitting edema bilateral, diminished pedal hair present bilateral.  Neurological: Gross sensation present via light touch bilateral.   Dermatological: Skin is warm, dry, and supple bilateral, Nails 1-10 are tender, long, thick, and discolored with mild subungal debris, no webspace macerations  present bilateral, no open lesions present bilateral, no callus/corns/hyperkeratotic tissue present bilateral. No signs of infection bilateral.  Musculoskeletal: + foot drop and weakness on right.  Assessment and Plan:  Problem List Items Addressed This Visit    None    Visit Diagnoses    Pain due to onychomycosis of toenails of both feet    -   Primary   PVD (peripheral vascular disease) ([place])       Foot drop, right       History of stroke         -Examined patient.  -Re-Discussed treatment options for painful mycotic nails. -Mechanically debrided and reduced mycotic nails with sterile nail nipper and dremel nail file without incident. -Continue with AFO brace and supportive shoes like before -Patient to return in 3 months for follow up evaluation or sooner if symptoms worsen.  Landis Martins, DPM

## 2019-07-10 DIAGNOSIS — E785 Hyperlipidemia, unspecified: Secondary | ICD-10-CM | POA: Diagnosis not present

## 2019-07-10 DIAGNOSIS — Z87448 Personal history of other diseases of urinary system: Secondary | ICD-10-CM | POA: Diagnosis not present

## 2019-07-10 DIAGNOSIS — I10 Essential (primary) hypertension: Secondary | ICD-10-CM | POA: Diagnosis not present

## 2019-07-10 DIAGNOSIS — K4090 Unilateral inguinal hernia, without obstruction or gangrene, not specified as recurrent: Secondary | ICD-10-CM | POA: Diagnosis not present

## 2019-07-10 DIAGNOSIS — R5383 Other fatigue: Secondary | ICD-10-CM | POA: Diagnosis not present

## 2019-07-10 DIAGNOSIS — L03115 Cellulitis of right lower limb: Secondary | ICD-10-CM | POA: Diagnosis not present

## 2019-07-10 DIAGNOSIS — C9110 Chronic lymphocytic leukemia of B-cell type not having achieved remission: Secondary | ICD-10-CM | POA: Diagnosis not present

## 2019-07-14 ENCOUNTER — Encounter (HOSPITAL_COMMUNITY): Payer: Self-pay | Admitting: Emergency Medicine

## 2019-07-14 ENCOUNTER — Emergency Department (HOSPITAL_COMMUNITY)
Admission: EM | Admit: 2019-07-14 | Discharge: 2019-07-14 | Discharge status: Against Medical Advice | Payer: PPO | Attending: Emergency Medicine | Admitting: Emergency Medicine

## 2019-07-14 ENCOUNTER — Other Ambulatory Visit: Payer: Self-pay

## 2019-07-14 DIAGNOSIS — R2240 Localized swelling, mass and lump, unspecified lower limb: Secondary | ICD-10-CM | POA: Insufficient documentation

## 2019-07-14 DIAGNOSIS — Z5321 Procedure and treatment not carried out due to patient leaving prior to being seen by health care provider: Secondary | ICD-10-CM | POA: Insufficient documentation

## 2019-07-14 DIAGNOSIS — L03115 Cellulitis of right lower limb: Secondary | ICD-10-CM | POA: Diagnosis not present

## 2019-07-14 LAB — BASIC METABOLIC PANEL
Anion gap: 8 (ref 5–15)
BUN: 21 mg/dL (ref 8–23)
CO2: 22 mmol/L (ref 22–32)
Calcium: 8.5 mg/dL — ABNORMAL LOW (ref 8.9–10.3)
Chloride: 107 mmol/L (ref 98–111)
Creatinine, Ser: 1.38 mg/dL — ABNORMAL HIGH (ref 0.61–1.24)
GFR calc Af Amer: 52 mL/min — ABNORMAL LOW (ref >60)
GFR calc non Af Amer: 45 mL/min — ABNORMAL LOW (ref >60)
Glucose, Bld: 100 mg/dL — ABNORMAL HIGH (ref 70–99)
Potassium: 5 mmol/L (ref 3.5–5.1)
Sodium: 137 mmol/L (ref 135–145)

## 2019-07-14 LAB — CBC
HCT: 31.3 % — ABNORMAL LOW (ref 39.0–52.0)
Hemoglobin: 9.9 g/dL — ABNORMAL LOW (ref 13.0–17.0)
MCH: 33.8 pg (ref 26.0–34.0)
MCHC: 31.6 g/dL (ref 30.0–36.0)
MCV: 106.8 fL — ABNORMAL HIGH (ref 80.0–100.0)
Platelets: 149 10*3/uL — ABNORMAL LOW (ref 150–400)
RBC: 2.93 MIL/uL — ABNORMAL LOW (ref 4.22–5.81)
RDW: 13.3 % (ref 11.5–15.5)
WBC: 31.6 10*3/uL — ABNORMAL HIGH (ref 4.0–10.5)
nRBC: 0 % (ref 0.0–0.2)

## 2019-07-14 NOTE — ED Triage Notes (Signed)
Pt endorses bilateral leg swelling for a few days. Denies any pain or SOB. Does not take diuretics.

## 2019-07-14 NOTE — ED Notes (Signed)
Pt stated they were leaving  

## 2019-07-15 DIAGNOSIS — M7989 Other specified soft tissue disorders: Secondary | ICD-10-CM | POA: Diagnosis not present

## 2019-07-15 DIAGNOSIS — M7121 Synovial cyst of popliteal space [Baker], right knee: Secondary | ICD-10-CM | POA: Diagnosis not present

## 2019-07-16 ENCOUNTER — Telehealth: Payer: Self-pay | Admitting: Cardiology

## 2019-07-16 ENCOUNTER — Ambulatory Visit: Payer: PPO | Admitting: Psychology

## 2019-07-16 DIAGNOSIS — Z6826 Body mass index (BMI) 26.0-26.9, adult: Secondary | ICD-10-CM | POA: Diagnosis not present

## 2019-07-16 DIAGNOSIS — L03115 Cellulitis of right lower limb: Secondary | ICD-10-CM | POA: Diagnosis not present

## 2019-07-16 NOTE — Telephone Encounter (Signed)
Spoke to daughter. Office note  Received from [facility] internal medicine. Patient will be seeing primary again on Tuesday 07/21/19   appointment schedule for Dr Ellyn Hack 08/11/19  At 2 pm . Has not been seen in office since 2019.  daughter is aware and verbalized understanding.

## 2019-07-16 NOTE — Telephone Encounter (Signed)
Spoke with daughter Donnald Garre who reports patient has had leg swelling for a few months. PCP thought he had cellulitis in Dec in right leg - leg was red, had a doppler that was negative. He has had bilateral leg edema that seems to have fluctuated in severity over the last few months. He got rocephin at [facility] earlier this week and was sent to [facility] (waited 6 hours and then left). He got another dose of rocephin yesterday(?) and was told if legs were no better, hey may need to go back to ED.   Patient mowed his yard yesterday. Daughter states he is up on his feet often. Daughter said patient checks weight at home but she is not sure how stable those are. She said his BP/pulse were low Tuesday but better now  Patient is at PCP office (Dr. Delena Bali in [place]) right now. Asked that office note be sent to our office and that they call with update after this visit to see was PCP's plan of care is. Will route info to MD once update has been received.

## 2019-07-16 NOTE — Telephone Encounter (Signed)
New Message   Pt c/o swelling: STAT is pt has developed SOB within 24 hours  1) How much weight have you gained and in what time span? unknown  2) If swelling, where is the swelling located? Legs(primarly in the right) as well as his feet sometimes when he has been up and active all day.   3) Are you currently taking a fluid pill? no  4) Are you currently SOB? no  5) Do you have a log of your daily weights (if so, list)? no  6) Have you gained 3 pounds in a day or 5 pounds in a week? unknown  7) Have you traveled recently? No   Doppler study was done yesterday and he had no blood clots but legs are still swollen

## 2019-07-21 DIAGNOSIS — M7121 Synovial cyst of popliteal space [Baker], right knee: Secondary | ICD-10-CM | POA: Diagnosis not present

## 2019-07-21 DIAGNOSIS — L03115 Cellulitis of right lower limb: Secondary | ICD-10-CM | POA: Diagnosis not present

## 2019-07-23 DIAGNOSIS — Z87448 Personal history of other diseases of urinary system: Secondary | ICD-10-CM | POA: Diagnosis not present

## 2019-07-23 DIAGNOSIS — L03115 Cellulitis of right lower limb: Secondary | ICD-10-CM | POA: Diagnosis not present

## 2019-07-24 DIAGNOSIS — R600 Localized edema: Secondary | ICD-10-CM | POA: Diagnosis not present

## 2019-07-24 DIAGNOSIS — M19071 Primary osteoarthritis, right ankle and foot: Secondary | ICD-10-CM | POA: Diagnosis not present

## 2019-07-24 DIAGNOSIS — L03115 Cellulitis of right lower limb: Secondary | ICD-10-CM | POA: Diagnosis not present

## 2019-07-31 DIAGNOSIS — I872 Venous insufficiency (chronic) (peripheral): Secondary | ICD-10-CM | POA: Diagnosis not present

## 2019-08-04 ENCOUNTER — Ambulatory Visit: Payer: PPO | Admitting: Adult Health

## 2019-08-04 ENCOUNTER — Encounter: Payer: Self-pay | Admitting: Adult Health

## 2019-08-04 ENCOUNTER — Other Ambulatory Visit: Payer: Self-pay

## 2019-08-04 VITALS — BP 167/87 | HR 87 | Temp 97.3°F | Ht 73.0 in | Wt 182.0 lb

## 2019-08-04 DIAGNOSIS — Z8673 Personal history of transient ischemic attack (TIA), and cerebral infarction without residual deficits: Secondary | ICD-10-CM | POA: Diagnosis not present

## 2019-08-04 DIAGNOSIS — F0151 Vascular dementia with behavioral disturbance: Secondary | ICD-10-CM | POA: Diagnosis not present

## 2019-08-04 DIAGNOSIS — I10 Essential (primary) hypertension: Secondary | ICD-10-CM

## 2019-08-04 DIAGNOSIS — F01518 Vascular dementia, unspecified severity, with other behavioral disturbance: Secondary | ICD-10-CM

## 2019-08-04 DIAGNOSIS — E785 Hyperlipidemia, unspecified: Secondary | ICD-10-CM

## 2019-08-04 DIAGNOSIS — I69320 Aphasia following cerebral infarction: Secondary | ICD-10-CM

## 2019-08-04 MED ORDER — DIVALPROEX SODIUM 125 MG PO CSDR: 125.0000 mg | DELAYED_RELEASE_CAPSULE | Freq: Every day | ORAL | 3 refills | Status: DC

## 2019-08-04 NOTE — Progress Notes (Signed)
[name] [address] [place]. [place] [address] [phone]       OFFICE FOLLOW UP NOTE  [name] Date of Birth:  [birth year] Medical Record Number:  [id number]   Reason for visit: Vascular dementia with behaviors and prior history of stroke   SUBJECTIVE:  CHIEF COMPLAINT:  Chief Complaint  Patient presents with  . Follow-up    hx of stroke, with wife, rm 9    HPI:  Today, 08/04/2019, Gregory Rodgers returns for follow-up accompanied by his wife with prior history of stroke with residual aphasia and cognitive impairment with behaviors. 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 tach, and status post placement of a biventricular pacemaker/AICD.  Concerns over the past year of gradually worsening cognition and more recently behavioral concerns with agitation, paranoia and hallucinations.  Initiated Depakote 125 mg nightly after prior visit due to behaviors likely in setting of dementia which have greatly improved since initiating Depakote.  Cognition has been relatively stable with wax/waning of symptoms.  He continues to stay active around his home.  Continues on clopidogrel and pravastatin for secondary stroke prevention. Loop recorder has not shown atrial fibrillation thus far.  No further concerns at this time.        ROS:   14 system review of systems performed and negative with exception of see HPI   PMH:  Past Medical History:  Diagnosis Date  . Basal cell carcinoma    "numerous places"  . Borderline diabetes   . Cardiomyopathy, dilated ([place]) 03/2016   a) EF ~30% with diffuse HK (not simply ICM) ? partially due to LBBB  (septal-lateral dyssynchrony). b) f/u Echo after BiV ICD = EF 45-50%  . CLL (chronic lymphocytic leukemia) ([place]) 09/2009   stage 0; on observation  . Coronary artery disease involving native heart without angina pectoris    PTCA of RCA in setting of an MI in 1986-87; no follow-up study since available.  . Essential hypertension   . History of kidney stones   . Hyperlipidemia with target LDL less than 70    With known CAD  . 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 Left bundle branch block (LBBB) on electrocardiogram    Noted as far back as 2008.  . Old inferior wall myocardial infarction 1986/87   Had PTCA most likely of the RCA    PSH:  Past Surgical History:  Procedure Laterality Date  . BASAL CELL CARCINOMA EXCISION     "numerous"  . BIV ICD INSERTION CRT-D N/A 01/09/2017   Procedure: BIV ICD INSERTION CRT-D;  Surgeon: Evans Lance, MD;  Location: [place] [facility];  Service: Cardiovascular;  Laterality: N/A;  . CATARACT EXTRACTION W/ INTRAOCULAR LENS  IMPLANT, BILATERAL Bilateral   . CORONARY ANGIOPLASTY  1986/1987  . defibllator    . dual pacemaker    . EP IMPLANTABLE DEVICE N/A 05/10/2016   Procedure: Loop Recorder Insertion;  Surgeon: Evans Lance, MD;  Location: [place] [facility];  Service: Cardiovascular;  Laterality: N/A;  slight blood drop on dressing  . JOINT REPLACEMENT    . MOHS SURGERY Left 01/02/2017   ear  . REPLACEMENT TOTAL KNEE Left   . TEE  WITHOUT CARDIOVERSION N/A 06/03/2017   Procedure: TRANSESOPHAGEAL ECHOCARDIOGRAM (TEE) - cancelled -   . TRANSTHORACIC ECHOCARDIOGRAM  03/2016   Normal LV size with mild LV hypertrophy. EF 30%. Diffuse  hypokinesis wtih septal-lateral dyssynchrony. Normal RV size and systolic function. Mild MR and mild AI.  Marland Kitchen TRANSTHORACIC ECHOCARDIOGRAM  06/03/2017   for CVA evaluation.  Diffuse hypokinesis EF 45 to 50% with moderate focal basal septal hypertrophy.  Moderate aortic insufficiency.  Large  PFO with positive bubble study. -Possible source of embolus.  Ascending aortic aneurysm unchanged.    Social History:  Social History   Socioeconomic History  . Marital status: Married    Spouse name: Not on file  . Number of children: Not on file  . Years of education: Not on file  . Highest education level: Not on file  Occupational History  . Not on file  Tobacco Use  . Smoking status: Never Smoker  . Smokeless tobacco: Never Used  Substance and Sexual Activity  . Alcohol use: No  . Drug use: No  . Sexual activity: Not on file  Other Topics Concern  . Not on file  Social History Narrative  . Not on file   Social Determinants of Health   Financial Resource Strain:   . Difficulty of Paying Living Expenses:   Food Insecurity:   . Worried About Charity fundraiser in the Last Year:   . Arboriculturist in the Last Year:   Transportation Needs:   . Film/video editor (Medical):   Marland Kitchen Lack of Transportation (Non-Medical):   Physical Activity:   . Days of Exercise per Week:   . Minutes of Exercise per Session:   Stress:   . Feeling of Stress :   Social Connections:   . Frequency of Communication with Friends and Family:   . Frequency of Social Gatherings with Friends and Family:   . Attends Religious Services:   . Active Member of Clubs or Organizations:   . Attends Archivist Meetings:   Marland Kitchen Marital Status:   Intimate Partner Violence:   . Fear of Current or Ex-Partner:   . Emotionally Abused:   Marland Kitchen Physically Abused:   . Sexually Abused:     Family History:  Family History  Problem Relation Age of Onset  . Sudden Cardiac Death Neg Hx     Medications:   Current Outpatient Medications on File Prior to Visit  Medication Sig Dispense Refill  . Acetaminophen (TYLENOL 8 HOUR PO) Take by mouth.    . clopidogrel (PLAVIX) 75 MG tablet Take 1 tablet (75 mg total) by mouth daily. 30 tablet 3  . co-enzyme Q-10 30 MG capsule Take 30 mg by mouth daily.    .  divalproex (DEPAKOTE SPRINKLE) 125 MG capsule Take 1 capsule (125 mg total) by mouth daily. 30 capsule 3  . donepezil (ARICEPT) 5 MG tablet Take 1 tablet (5 mg total) by mouth at bedtime. 30 tablet 2  . ibuprofen (ADVIL) 200 MG tablet Take 200 mg by mouth as needed.    Marland Kitchen lisinopril (PRINIVIL,ZESTRIL) 10 MG tablet Take 1 tablet (10 mg total) by mouth daily. PLEASE CONTACT OFFICE FOR ADDITIONAL REFILLS 15 tablet 0  . MEGARED OMEGA-3 KRILL OIL 500 MG CAPS Take 1 capsule by mouth daily.    . metoprolol succinate (TOPROL-XL) 25 MG 24 hr tablet TAKE ONE TABLET BY MOUTH ONCE DAILY 30 tablet 9  . Multiple Vitamin (MULTIVITAMIN) tablet Take 1 tablet by mouth daily.    Marland Kitchen  omeprazole (PRILOSEC) 20 MG capsule     . pravastatin (PRAVACHOL) 10 MG tablet Take 10 mg by mouth 3 (three) times a week.     . tamsulosin (FLOMAX) 0.4 MG CAPS capsule Take 0.4 mg by mouth daily.     No current facility-administered medications on file prior to visit.    Allergies:   Allergies  Allergen Reactions  . Tape Other (See Comments)    "PLASTIC" TAPE PULLS OFF THE SKIN AND THE REMOVAL OF ALL TAPES BRUISE HIM!!!     OBJECTIVE:   Vitals  Vitals:   08/04/19 1245  BP: (!) 167/87  Pulse: 87  Temp: (!) 97.3 F (36.3 C)  Weight: 182 lb (82.6 kg)  Height: 6\' 1"  (1.854 m)   Body mass index is 24.01 kg/m. No exam data present   Physical exam  General: well developed, pleasant elderly Caucasian male seated, in no evident distress Rodgers: Rodgers normocephalic and atraumatic.   Neck: supple with no carotid or supraclavicular bruits Cardiovascular: regular rate and rhythm, no murmurs Musculoskeletal: no deformity Skin:  no rash/petichiae Vascular:  Normal pulses all extremities  Neurologic Exam Mental Status: Awake and fully alert.  Expressive aphasia.  Able to state name, age and year of birth but difficulty stating today's date and location. Recent and remote memory diminished.  Attention span, concentration and  fund of knowledge diminished. Mood and affect appropriate.   Cranial Nerves: Pupils equal, briskly reactive to light. Extraocular movements full without nystagmus. Visual fields full to confrontation.  Severely HOH bilaterally. Facial sensation intact. Face, tongue, palate moves normally and symmetrically.  Motor: Normal bulk and tone. Normal strength in all tested extremity muscles. Sensory.: intact to touch , pinprick , position and vibratory sensation.  Coordination: Rapid alternating movements normal in all extremities. Finger-to-nose and heel-to-shin performed accurately bilaterally. Gait and Station: Arises from chair without difficulty. Stance is normal. Gait demonstrates normal stride length and balance .  Reflexes: 1+ and symmetric. Toes downgoing.      ASSESSMENT:   Gregory Rodgers is a 86 y.o. year old male here history of left MCA cryptogenic stroke on 06/01/2017 with residual expressive aphasia and cognitive impairment.  Loop recorder placed which has not shown atrial fibrillation thus far.  Vascular risk factors include HTN, HLD, borderline DM, CHF, cardiomyopathy s/p biventricular pacemaker/AICD and CAD with MI.  Gradual cognitive decline over the past year and recently concerns with behaviors therefore initiated Depakote 125 mg nightly with great benefit.  Unable to fully assess extent of dementia with difficulty obtaining MMSE with aphasia and hearing issues likely contributing     PLAN:  -Continue Depakote 125 mg nightly to assist with behaviors as prior cognitive impairment slowly declining to likely vascular dementia -refill provided -Highly encourage staying active with regular exercise and maintaining a healthy diet -Continue clopidogrel 75 mg daily  and pravastatin for secondary stroke prevention -F/u with PCP regarding your HLD, HTN and CAD management -Continue to monitor loop recorder for possible atrial fibrillation -continue to monitor BP at home -Maintain  strict control of hypertension with blood pressure goal below 130/90, diabetes with hemoglobin A1c goal below 6.5% and cholesterol with LDL cholesterol (bad cholesterol) goal below 70 mg/dL. I also advised the patient to eat a healthy diet with plenty of whole grains, cereals, fruits and vegetables, exercise regularly and maintain ideal body weight.   Follow-up in 6 months or call earlier if needed   I spent 33 minutes of face-to-face and non-face-to-face time with patient.  This included previsit chart review, lab review, study review, order entry, electronic health record documentation, patient education    Frann Rider, [facility]  [facility] [address] [place], [place] [address]  Phone [phone] Fax [phone]

## 2019-08-04 NOTE — Progress Notes (Signed)
I agree with the above plan 

## 2019-08-04 NOTE — Patient Instructions (Signed)
Continue Depakote 125 mg nightly for ongoing benefit of behavioral concerns likely related to underlying dementia  Continue clopidogrel 75 mg daily  and pravastatin  for secondary stroke prevention  Continue to follow up with PCP regarding cholesterol and blood pressure management   Continue to monitor blood pressure at home  Maintain strict control of hypertension with blood pressure goal below 130/90, diabetes with hemoglobin A1c goal below 6.5% and cholesterol with LDL cholesterol (bad cholesterol) goal below 70 mg/dL. I also advised the patient to eat a healthy diet with plenty of whole grains, cereals, fruits and vegetables, exercise regularly and maintain ideal body weight.  Followup in the future with me in 6 months or call earlier if needed       Thank you for coming to see [place] at [facility]. I hope we have been able to provide you high quality care today.  You may receive a patient satisfaction survey over the next few weeks. We would appreciate your feedback and comments so that we may continue to improve ourselves and the health of our patients.

## 2019-08-06 ENCOUNTER — Telehealth: Payer: Self-pay | Admitting: Cardiology

## 2019-08-06 NOTE — Telephone Encounter (Signed)
New Message:    Daughter called and said pt have an appt on Tuesday with Dr Ellyn Hack. She says she and the pt's wife will need to come in with him.  Pt have had a stroke and need them botht o come in with him Daughter to assist with him also.Marland Kitchen He have problem with memory also.

## 2019-08-06 NOTE — Telephone Encounter (Signed)
Spoke with patient's daughter. She stated that the patient has an appointment on 4/27 with Dr. Ellyn Hack and the wife and daughter would like to come back for the appointment.  She has been advised that due to covid restrictions we may allow one person back to assist the patient and can call the other family member. She stated that the wife would need to come back with the patient since that "is what they always do" but that daughter would need to come to help her parents understand.

## 2019-08-06 NOTE — Telephone Encounter (Signed)
At age 86, he needs to have his daughter with him.  I think the wife comes because she would not do well sitting out in the waiting room by herself.  They live together.  I have no problem with the wife and husband coming back together.  The daughter is with him all the time.  I also am not worried about that.  Glenetta Hew, MD

## 2019-08-07 NOTE — Telephone Encounter (Signed)
The daughter has been made aware. Note has been added to appointment.

## 2019-08-11 ENCOUNTER — Other Ambulatory Visit: Payer: Self-pay

## 2019-08-11 ENCOUNTER — Encounter: Payer: Self-pay | Admitting: Cardiology

## 2019-08-11 ENCOUNTER — Ambulatory Visit: Payer: PPO | Admitting: Cardiology

## 2019-08-11 VITALS — BP 130/64 | HR 51 | Temp 97.7°F | Ht 70.0 in | Wt 181.5 lb

## 2019-08-11 DIAGNOSIS — E782 Mixed hyperlipidemia: Secondary | ICD-10-CM

## 2019-08-11 DIAGNOSIS — I872 Venous insufficiency (chronic) (peripheral): Secondary | ICD-10-CM

## 2019-08-11 DIAGNOSIS — Z9861 Coronary angioplasty status: Secondary | ICD-10-CM

## 2019-08-11 DIAGNOSIS — I472 Ventricular tachycardia: Secondary | ICD-10-CM

## 2019-08-11 DIAGNOSIS — I10 Essential (primary) hypertension: Secondary | ICD-10-CM

## 2019-08-11 DIAGNOSIS — I255 Ischemic cardiomyopathy: Secondary | ICD-10-CM

## 2019-08-11 DIAGNOSIS — I2510 Atherosclerotic heart disease of native coronary artery without angina pectoris: Secondary | ICD-10-CM

## 2019-08-11 DIAGNOSIS — I447 Left bundle-branch block, unspecified: Secondary | ICD-10-CM

## 2019-08-11 DIAGNOSIS — I4729 Other ventricular tachycardia: Secondary | ICD-10-CM

## 2019-08-11 MED ORDER — FUROSEMIDE 40 MG PO TABS: 40.0000 mg | ORAL_TABLET | ORAL | 5 refills | Status: DC

## 2019-08-11 NOTE — Progress Notes (Signed)
Primary Care Provider: Earlyne Iba, NP Cardiologist: Glenetta Hew, MD Electrophysiologist: [name], MD  Clinic Note: Chief Complaint  Patient presents with  . Follow-up    Has been was 2 years  . Edema    Left greater than right leg swelling.  Recently treated with antibiotics for cellulitis.  . Cardiomyopathy    No real heart failure symptoms.    HPI:    Gregory Rodgers is a 86 y.o. male with a PMH notable for distant history of CAD-PCI, ISCHEMIC CARDIOMYOPATHY/CHRONIC COMBINED SYSTOLIC AND DIASTOLIC HEART FAILURE-CLASS II with LBBB-  S/p BiV IC (CRTD placed after he was found to have polymorphic VT by Dr.Taylor) who presents today for almost 2-year follow-up with me -> with a chief complaint of leg swelling (left greater than right).  In February 2019, Gregory Rodgers suffered left MCA CVA-found to have multiple small acute L MCA territory infarcts with irregular LICA 123456 lesion.  No A. fib noted.  Was started on Plavix and atorvastatin.  He clearly has evidence of vascular dementia. Additional history includes B Cell CLL.  Gregory Rodgers was last seen on November 02, 2017 at that time for delayed follow-up as well.  He has been mostly followed by Dr. Lovena Le.  Seem to have recovered from CVA just had some residual word loss and dysarthria.  Stable from cardiac standpoint.  Was improved with CRT-D.  No PND, orthopnea was stable mild edema.  Rare fluttering sensations. -->  No changes (had been converted from atorvastatin to pravastatin for memory and muscle pain issues.  His last cardiology evaluation was read by Dr. Lovena Le back in October 2020. Had done well with no major issues.  No chest pain pressure.  Notably more sedentary since his stroke.  Noted only class II CHF symptoms.   He was seen by [facility] internal medicine on March 26 and 30 as well as April 1 with concerns of cellulitis right leg and ankle swelling.  Had mild drainage of edema. -->  He had been  treated with Rocephin followed by Bactrim p.o.  Noted improvement in symptoms.  DVT negative by Dopplers.  Noted that the patient was not on diuretic.  Recent Hospitalizations:   He went to the emergency room on July 14, 2019 after the second visit at the PCP office because of increased air edema erythema and discoloration.  The left wrist prior to being seen.  Reviewed  CV studies:    The following studies were reviewed today: (if available, images/films reviewed: From Epic Chart or Care Everywhere) . No recent studies other than defibrillator interrogation.:   Interval History:   Gregory Rodgers is here today along with his wife and daughter.  His left leg seems to be doing a whole lot better after treated with antibiotics.  There is no evidence of DVT but there was suggestion of Baker's cyst.  He does have intermittent swelling on his left greater than right leg, but has not had any significant PND or orthopnea--they state that he only sleeps with his head up because it helps keep getting dizzy.Marland Kitchen   He has good days and bad days with some days he will just feel totally weak and lethargic.  But most days he is able to a decent amount of activity.  He does not have a lot of energy and is somewhat lethargic, but denies any profound dizziness, wooziness or syncope.  No CHF symptoms as noted.  No chest pain or pressure.  Denies any palpitations.    CV Review of Symptoms (Summary) Cardiovascular ROS: positive for - edema and As noted.  He does not really exert himself enough to notice significant exertional dyspnea or chest pain/pressure but nothing at rest. negative for - chest pain, dyspnea on exertion, irregular heartbeat, orthopnea, palpitations, paroxysmal nocturnal dyspnea, rapid heart rate, shortness of breath or Syncope/near syncope or TIA/amaurosis fugax.  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  Enjoys doing yard work, but does not do well bending over.  He  cannot do well with down to the ground -> would not build to get back up again.  The patient does not have symptoms concerning for COVID-19 infection (fever, chills, cough, or new shortness of breath).  The patient is practicing social distancing & Masking.    REVIEWED OF SYSTEMS   Review of Systems  Constitutional: Positive for malaise/fatigue (Just does not seem to have that much energy). Negative for weight loss.  HENT: Negative for congestion and nosebleeds.   Respiratory: Negative for cough and shortness of breath.   Cardiovascular: Positive for leg swelling.  Gastrointestinal: Positive for abdominal pain. Negative for blood in stool, constipation and melena.  Genitourinary: Negative for hematuria (Not visible, but has had microscopic hematuria.).  Musculoskeletal: Positive for back pain and joint pain. Negative for falls.  Neurological: Positive for weakness (Generalized). Negative for dizziness (Only when he is super tired, and when he lies down flat) and focal weakness.  Psychiatric/Behavioral: Positive for memory loss. The patient is not nervous/anxious and does not have insomnia.    I have reviewed and (if needed) personally updated the patient's problem list, medications, allergies, past medical and surgical history, social and family history.   PAST MEDICAL HISTORY   Past Medical History:  Diagnosis Date  . Basal cell carcinoma    "numerous places"  . Borderline diabetes   . Cardiomyopathy, dilated ([place]) 03/2016   a) EF ~30% with diffuse HK (not simply ICM) ? partially due to LBBB (septal-lateral dyssynchrony). b) f/u Echo after BiV ICD = EF 45-50%  . CLL (chronic lymphocytic leukemia) ([place]) 09/2009   stage 0; on observation  . Coronary artery disease involving native heart without angina pectoris    PTCA of RCA in setting of an MI in 1986-87; no follow-up study since available.  . Essential hypertension   . History of kidney stones   . Hyperlipidemia with target LDL  less than 70    With known CAD  . 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 Left bundle branch block (LBBB) on electrocardiogram    Noted as far back as 2008.  . Old inferior wall myocardial infarction 1986/87   Had PTCA most likely of the RCA    PAST SURGICAL HISTORY   Past Surgical History:  Procedure Laterality Date  . BASAL CELL CARCINOMA EXCISION     "numerous"  . BIV ICD INSERTION CRT-D N/A 01/09/2017   Procedure: BIV ICD INSERTION CRT-D;  Surgeon: Evans Lance, MD;  Location: [facility];  Service: Cardiovascular;  Laterality: N/A;  . CATARACT EXTRACTION W/ INTRAOCULAR LENS  IMPLANT, BILATERAL Bilateral   . CORONARY ANGIOPLASTY  1986/1987  . defibllator    . dual pacemaker    . EP IMPLANTABLE DEVICE N/A 05/10/2016   Procedure: Loop Recorder Insertion;  Surgeon: Evans Lance, MD;  Location: [facility];  Service: Cardiovascular;  Laterality: N/A;  slight blood drop on dressing  . JOINT REPLACEMENT    . MOHS SURGERY Left 01/02/2017   ear  . REPLACEMENT TOTAL KNEE Left   . TEE WITHOUT CARDIOVERSION N/A 06/03/2017   Procedure: TRANSESOPHAGEAL ECHOCARDIOGRAM (TEE) - cancelled -   . TRANSTHORACIC ECHOCARDIOGRAM  03/2016   Normal LV size with mild LV hypertrophy. EF 30%. Diffuse  hypokinesis wtih septal-lateral dyssynchrony. Normal RV size and systolic function. Mild MR and mild AI.  Marland Kitchen TRANSTHORACIC ECHOCARDIOGRAM  06/03/2017   for CVA evaluation.  Diffuse hypokinesis EF 45 to 50% with moderate focal basal septal hypertrophy.  Moderate aortic insufficiency.  Large PFO with positive bubble study. -Possible source of embolus.  Ascending aortic aneurysm unchanged.    MEDICATIONS/ALLERGIES   Current Meds  Medication Sig  . Acetaminophen (TYLENOL 8 HOUR PO) Take by mouth.  . clopidogrel (PLAVIX) 75 MG tablet Take 1 tablet (75 mg total) by mouth daily.  Marland Kitchen co-enzyme Q-10 30 MG capsule Take 30 mg by mouth daily.  . divalproex (DEPAKOTE  SPRINKLE) 125 MG capsule Take 1 capsule (125 mg total) by mouth daily.  Marland Kitchen ibuprofen (ADVIL) 200 MG tablet Take 200 mg by mouth as needed.  Marland Kitchen lisinopril (ZESTRIL) 5 MG tablet Take 5 mg by mouth daily.  Marland Kitchen MEGARED OMEGA-3 KRILL OIL 500 MG CAPS Take 1 capsule by mouth daily.  . metoprolol succinate (TOPROL-XL) 25 MG 24 hr tablet TAKE ONE TABLET BY MOUTH ONCE DAILY  . Multiple Vitamin (MULTIVITAMIN) tablet Take 1 tablet by mouth daily.  Marland Kitchen omeprazole (PRILOSEC) 20 MG capsule   . pravastatin (PRAVACHOL) 10 MG tablet Take 10 mg by mouth 3 (three) times a week.   . tamsulosin (FLOMAX) 0.4 MG CAPS capsule Take 0.4 mg by mouth daily.  . [DISCONTINUED] donepezil (ARICEPT) 5 MG tablet Take 1 tablet (5 mg total) by mouth at bedtime.  . [DISCONTINUED] lisinopril (PRINIVIL,ZESTRIL) 10 MG tablet Take 1 tablet (10 mg total) by mouth daily. PLEASE CONTACT OFFICE FOR ADDITIONAL REFILLS    Allergies  Allergen Reactions  . Tape Other (See Comments)    "PLASTIC" TAPE PULLS OFF THE SKIN AND THE REMOVAL OF ALL TAPES BRUISE HIM!!!    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  No car driving. Alycia Patten -PE, EKG, labs   Wt Readings from Last 3 Encounters:  08/11/19 181 lb 8 oz (82.3 kg)  08/04/19 182 lb (82.6 kg)  07/14/19 180 lb (81.6 kg)    Physical Exam: BP 130/64   Pulse (!) 51   Temp 97.7 F (36.5 C)   Ht 5\' 10"  (1.778 m)   Wt 181 lb 8 oz (82.3 kg)   SpO2 97%   BMI 26.04 kg/m  Physical Exam  Constitutional: He is oriented to person, place, and time. He appears well-developed and well-nourished.  Pleasant elderly gentleman.  No obvious distress.  Well-groomed.  HENT:  Head: Normocephalic and atraumatic.  Neck: No hepatojugular reflux and no JVD present. Carotid bruit is not present.  Cardiovascular: Normal rate, regular rhythm and S1 normal.  No extrasystoles are present. PMI is not displaced. Exam reveals  distant heart sounds and decreased pulses (Mildly diminished pedal pulses.). Exam reveals no gallop and no friction rub.  No murmur (Soft low pitched 1/6 DM at RUSB.) heard. Physiologically split S2  Pulmonary/Chest: Effort normal and breath sounds normal. No respiratory distress. He has no wheezes.  Musculoskeletal:  General: Edema (He does have maybe 2+ left and 1+ right lower extremity swelling.  No more erythema.  Mild venous stasis changes.) present. Normal range of motion.     Cervical back: Normal range of motion and neck supple.  Neurological: He is alert and oriented to person, place, and time. No cranial nerve deficit.  Wife and daughter tend to answer most questions and ask questions.  Skin: No erythema.  Psychiatric: He has a normal mood and affect. His behavior is normal.  Somewhat of a poor historian.  Vitals reviewed.    Adult ECG Report  Rate: 51 ;  Rhythm: Atrial sensing, ventricular pacing.;   Narrative Interpretation: Stable EKG.  Recent Labs:  07/16/2019:   WBC 45.1.  H/H 10.7/31.9.  Platelets 129. Na+ 140, K+ 4.2, Cl- 106, HCO3-22, BUN 24, Cr 1.37, Glu 104, Ca2+ 8.5; AST 21, ALT 12, AlkP 67  12/24/2018: TC 117, TG 52, HDL 52, LDL 53  07/15/2019: Left lower extremity venous Dopplers no evidence of DVT.  Baker's cyst noted in the popliteal fossa of left lower extremity.  Lab Results  Component Value Date   CHOL 134 06/01/2017   HDL 37 (L) 06/01/2017   LDLCALC 81 06/01/2017   TRIG 80 06/01/2017   CHOLHDL 3.6 06/01/2017   Lab Results  Component Value Date   CREATININE 1.38 (H) 07/14/2019   BUN 21 07/14/2019   NA 137 07/14/2019   K 5.0 07/14/2019   CL 107 07/14/2019   CO2 22 07/14/2019   Lab Results  Component Value Date   TSH 2.480 12/01/2018    ASSESSMENT/PLAN    Problem List Items Addressed This Visit    Ischemic cardiomyopathy: EF 40-45% reduced to ~30% - post ICD up to 45-50%. - Primary (Chronic)    Notably improved EF after CRT-D.  He does  have edema but is not having any PND orthopnea.  This would suggest that the edema is not related to cardiomyopathy.  More likely related to venous stasis. He is on metoprolol succinate and lisinopril.  Says he does have some swelling that comes intermittently I would want to avoid volume overload.  We will add 40 mg of Lasix to take at least twice a day and then as needed.      Relevant Medications   lisinopril (ZESTRIL) 5 MG tablet   furosemide (LASIX) 40 MG tablet   Other Relevant Orders   EKG 12-Lead (Completed)   Hyperlipidemia (Chronic)    Was converted to high-dose statin after a stroke.  This was then converted down to pravastatin 10 mg.  Last labs look well controlled.  He is not to be on tolerate high dose of any statin.      Relevant Medications   lisinopril (ZESTRIL) 5 MG tablet   furosemide (LASIX) 40 MG tablet   Essential hypertension (Chronic)    Blood pressure looks good today.  Would not titrate beta-blocker further because of bradycardia.  I do not want to increase his lisinopril dose either because of dizziness. Plan will be to change his beta-blocker dose to the evening since his palpitations have been in the evening.  This would potentially lead to higher blood pressures better energy during the day.      Relevant Medications   lisinopril (ZESTRIL) 5 MG tablet   furosemide (LASIX) 40 MG tablet   CAD S/P percutaneous coronary angioplasty (Chronic)    Very distant history of CAD.  No further angina.  He is on metoprolol and lisinopril along  with pravastatin.      Relevant Medications   lisinopril (ZESTRIL) 5 MG tablet   furosemide (LASIX) 40 MG tablet   Left bundle branch block (LBBB) on electrocardiogram (Chronic)    Chronic finding.  Now status post BiV ICD.  Suspect that this could in part of the reason why his EF is down.      Polymorphic ventricular tachycardia (HCC) (Chronic)    History of V. tach now status post BiV ICD/CRT-D.  No further  episodes. On beta-blocker.      Relevant Medications   lisinopril (ZESTRIL) 5 MG tablet   furosemide (LASIX) 40 MG tablet   Edema of lower extremity due to peripheral venous insufficiency    He has edema that seems to be more on one side than the other.  Is not associated with PND orthopnea or any progression of dyspnea. He has no JVD nor does he have any engorgement of his upper extremity veins.  Arm pain go down dramatically with arm elevation suggesting normal right heart pressures.  I think the best option for an elderly gentleman with edema is support stockings and foot elevation.  I discussed the potential of using zipper type support stockings.  Would also recommend walking more.  I will give a as needed prescription of Lasix 40 mg which he can take twice a week and then additionally as needed just to avoid volume overload given his reduced EF.      Relevant Medications   lisinopril (ZESTRIL) 5 MG tablet   furosemide (LASIX) 40 MG tablet       COVID-19 Education: The signs and symptoms of COVID-19 were discussed with the patient and how to seek care for testing (follow up with PCP or arrange E-visit).   The importance of social distancing and COVID-19 vaccination was discussed today.  I spent a total of 26 minutes with the patient. >  50% of the time was spent in direct patient consultation.  Additional time spent with chart review  / charting (studies, outside notes, etc): 15 Total Time: 41 min   Current medicines are reviewed at length with the patient today.  (+/- concerns) n/a -> would prefer to avoid additional medications.  Notice: This dictation was prepared with Dragon dictation along with smaller phrase technology. Any transcriptional errors that result from this process are unintentional and may not be corrected upon review.  Patient Instructions / Medication Changes & Studies & Tests Ordered   Patient Instructions  Medication Instructions:  START Furosemide  40 mg twice a week.  *If you need a refill on your cardiac medications before your next appointment, please call your pharmacy*   Lab Work: None ordered If you have labs (blood work) drawn today and your tests are completely normal, you will receive your results only by: Marland Kitchen MyChart Message (if you have MyChart) OR . A paper copy in the mail If you have any lab test that is abnormal or we need to change your treatment, we will call you to review the results.   Testing/Procedures: None ordered   Follow-Up: At [facility], you and your health needs are our priority.  As part of our continuing mission to provide you with exceptional heart care, we have created designated Provider Care Teams.  These Care Teams include your primary Cardiologist (physician) and Advanced Practice Providers (APPs -  Physician Assistants and Nurse Practitioners) who all work together to provide you with the care you need, when you need it.  We recommend  signing up for the patient portal called "MyChart".  Sign up information is provided on this After Visit Summary.  MyChart is used to connect with patients for Virtual Visits (Telemedicine).  Patients are able to view lab/test results, encounter notes, upcoming appointments, etc.  Non-urgent messages can be sent to your provider as well.   To learn more about what you can do with MyChart, go to [email or web address].    Your next appointment:   6 month(s)  The format for your next appointment:   In Person  Provider:   You may see Glenetta Hew, MD or one of the following Advanced Practice Providers on your designated Care Team:    Rosaria Ferries, PA-C  Jory Sims, DNP, ANP  Cadence Kathlen Mody, NP  Other Instructions Please wear zipper compression stockings during the day and elevate your legs for 30 minutes at least twice a day.      Studies Ordered:   Orders Placed This Encounter  Procedures  . EKG 12-Lead     Glenetta Hew, M.D.,  M.S. Interventional Cardiologist   Pager # [phone] Phone # [phone] [address] [place], [place] [address]   Thank you for choosing Heartcare at [facility]!!

## 2019-08-11 NOTE — Patient Instructions (Addendum)
Medication Instructions:  START Furosemide 40 mg twice a week.  *If you need a refill on your cardiac medications before your next appointment, please call your pharmacy*   Lab Work: None ordered If you have labs (blood work) drawn today and your tests are completely normal, you will receive your results only by: Marland Kitchen MyChart Message (if you have MyChart) OR . A paper copy in the mail If you have any lab test that is abnormal or we need to change your treatment, we will call you to review the results.   Testing/Procedures: None ordered   Follow-Up: At [facility], you and your health needs are our priority.  As part of our continuing mission to provide you with exceptional heart care, we have created designated Provider Care Teams.  These Care Teams include your primary Cardiologist (physician) and Advanced Practice Providers (APPs -  Physician Assistants and Nurse Practitioners) who all work together to provide you with the care you need, when you need it.  We recommend signing up for the patient portal called "MyChart".  Sign up information is provided on this After Visit Summary.  MyChart is used to connect with patients for Virtual Visits (Telemedicine).  Patients are able to view lab/test results, encounter notes, upcoming appointments, etc.  Non-urgent messages can be sent to your provider as well.   To learn more about what you can do with MyChart, go to [email or web address].    Your next appointment:   6 month(s)  The format for your next appointment:   In Person  Provider:   You may see Glenetta Hew, MD or one of the following Advanced Practice Providers on your designated Care Team:    Rosaria Ferries, PA-C  Jory Sims, DNP, ANP  Cadence Kathlen Mody, NP  Other Instructions Please wear zipper compression stockings during the day and elevate your legs for 30 minutes at least twice a day.

## 2019-08-16 ENCOUNTER — Encounter: Payer: Self-pay | Admitting: Cardiology

## 2019-08-16 DIAGNOSIS — I872 Venous insufficiency (chronic) (peripheral): Secondary | ICD-10-CM | POA: Insufficient documentation

## 2019-08-16 NOTE — Assessment & Plan Note (Signed)
Chronic finding.  Now status post BiV ICD.  Suspect that this could in part of the reason why his EF is down.

## 2019-08-16 NOTE — Assessment & Plan Note (Signed)
Notably improved EF after CRT-D.  He does have edema but is not having any PND orthopnea.  This would suggest that the edema is not related to cardiomyopathy.  More likely related to venous stasis. He is on metoprolol succinate and lisinopril.  Says he does have some swelling that comes intermittently I would want to avoid volume overload.  We will add 40 mg of Lasix to take at least twice a day and then as needed.

## 2019-08-16 NOTE — Assessment & Plan Note (Signed)
Was converted to high-dose statin after a stroke.  This was then converted down to pravastatin 10 mg.  Last labs look well controlled.  He is not to be on tolerate high dose of any statin.

## 2019-08-16 NOTE — Assessment & Plan Note (Signed)
History of V. tach now status post BiV ICD/CRT-D.  No further episodes. On beta-blocker.

## 2019-08-16 NOTE — Assessment & Plan Note (Addendum)
Blood pressure looks good today.  Would not titrate beta-blocker further because of bradycardia.  I do not want to increase his lisinopril dose either because of dizziness. Plan will be to change his beta-blocker dose to the evening since his palpitations have been in the evening.  This would potentially lead to higher blood pressures better energy during the day.

## 2019-08-16 NOTE — Assessment & Plan Note (Signed)
He has edema that seems to be more on one side than the other.  Is not associated with PND orthopnea or any progression of dyspnea. He has no JVD nor does he have any engorgement of his upper extremity veins.  Arm pain go down dramatically with arm elevation suggesting normal right heart pressures.  I think the best option for an elderly gentleman with edema is support stockings and foot elevation.  I discussed the potential of using zipper type support stockings.  Would also recommend walking more.  I will give a as needed prescription of Lasix 40 mg which he can take twice a week and then additionally as needed just to avoid volume overload given his reduced EF.

## 2019-08-16 NOTE — Assessment & Plan Note (Signed)
Very distant history of CAD.  No further angina.  He is on metoprolol and lisinopril along with pravastatin.

## 2019-08-20 ENCOUNTER — Ambulatory Visit: Payer: PPO | Admitting: Sports Medicine

## 2019-08-21 DIAGNOSIS — S60311A Abrasion of right thumb, initial encounter: Secondary | ICD-10-CM | POA: Diagnosis not present

## 2019-08-21 DIAGNOSIS — S40811A Abrasion of right upper arm, initial encounter: Secondary | ICD-10-CM | POA: Diagnosis not present

## 2019-08-23 DIAGNOSIS — S60311S Abrasion of right thumb, sequela: Secondary | ICD-10-CM | POA: Diagnosis not present

## 2019-09-07 DIAGNOSIS — C9110 Chronic lymphocytic leukemia of B-cell type not having achieved remission: Secondary | ICD-10-CM | POA: Diagnosis not present

## 2019-09-11 ENCOUNTER — Telehealth: Payer: Self-pay | Admitting: Diagnostic Neuroimaging

## 2019-09-11 DIAGNOSIS — F0391 Unspecified dementia with behavioral disturbance: Secondary | ICD-10-CM | POA: Diagnosis not present

## 2019-09-11 DIAGNOSIS — M79602 Pain in left arm: Secondary | ICD-10-CM | POA: Diagnosis not present

## 2019-09-11 DIAGNOSIS — Z6825 Body mass index (BMI) 25.0-25.9, adult: Secondary | ICD-10-CM | POA: Diagnosis not present

## 2019-09-11 DIAGNOSIS — M79601 Pain in right arm: Secondary | ICD-10-CM | POA: Diagnosis not present

## 2019-09-11 DIAGNOSIS — C9110 Chronic lymphocytic leukemia of B-cell type not having achieved remission: Secondary | ICD-10-CM | POA: Diagnosis not present

## 2019-09-11 NOTE — Telephone Encounter (Signed)
Patient more agitated. Family requesting to increase depakote to 125mg  twice a day (from just at bedtime). Agree with plan.   Penni Bombard, MD [id number], [id number] AM Certified in Neurology, Neurophysiology and Neuroimaging  [facility] [address], [place], [place] [address] [phone]

## 2019-09-15 NOTE — Telephone Encounter (Signed)
Noted! Thank you

## 2019-10-01 ENCOUNTER — Other Ambulatory Visit: Payer: Self-pay

## 2019-10-01 ENCOUNTER — Ambulatory Visit (INDEPENDENT_AMBULATORY_CARE_PROVIDER_SITE_OTHER): Payer: PPO | Admitting: Sports Medicine

## 2019-10-01 ENCOUNTER — Encounter: Payer: Self-pay | Admitting: Sports Medicine

## 2019-10-01 DIAGNOSIS — M79674 Pain in right toe(s): Secondary | ICD-10-CM

## 2019-10-01 DIAGNOSIS — B351 Tinea unguium: Secondary | ICD-10-CM

## 2019-10-01 DIAGNOSIS — Z8673 Personal history of transient ischemic attack (TIA), and cerebral infarction without residual deficits: Secondary | ICD-10-CM

## 2019-10-01 DIAGNOSIS — M79675 Pain in left toe(s): Secondary | ICD-10-CM | POA: Diagnosis not present

## 2019-10-01 DIAGNOSIS — I739 Peripheral vascular disease, unspecified: Secondary | ICD-10-CM

## 2019-10-01 DIAGNOSIS — I872 Venous insufficiency (chronic) (peripheral): Secondary | ICD-10-CM

## 2019-10-01 DIAGNOSIS — M21371 Foot drop, right foot: Secondary | ICD-10-CM

## 2019-10-01 NOTE — Progress Notes (Signed)
Subjective: Gregory Rodgers is a 86 y.o. male patient seen today in office with complaint of mildly painful thickened and elongated toenails; unable to trim.  Patient denies any changes with medical history since last encounter, everything is the same, Still on Plavix.  No other pedal complaints noted.  Patient is also assisted by wife who will be seen this visit as well. Patient Active Problem List   Diagnosis Date Noted  . Edema of lower extremity due to peripheral venous insufficiency 08/16/2019  . Nonischemic cardiomyopathy ([place]) 02/13/2019  . ICD (implantable cardioverter-defibrillator) in place 02/13/2019  . Dementia ([place]) 12/17/2018  . Near syncope 11/02/2017  . History of skin cancer 09/23/2017  . Transient speech disturbance 06/01/2017  . 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 Cerebral embolism with cerebral infarction 06/01/2017  . Polymorphic ventricular tachycardia ([place]) 01/09/2017  . NSVT (nonsustained ventricular tachycardia) ([place]) 05/01/2016  . Left bundle branch block (LBBB) on electrocardiogram 02/27/2016  . Dizziness of unknown cause 02/27/2016  . Bradycardia with 41-50 beats per minute 02/27/2016  . Ischemic cardiomyopathy: EF 40-45% reduced to ~30% - post ICD up to 45-50%. 02/27/2016  . Pancytopenia, acquired ([place]) 09/30/2015  . Thrombocytopenia ([place]) 10/01/2014  . 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 CLL (chronic lymphocytic leukemia) ([place]) 06/20/2011  . Hyperlipidemia 06/20/2011  . Essential hypertension 06/20/2011  . Borderline diabetes 06/20/2011  . CAD S/P percutaneous coronary angioplasty 02/26/1985    Current Outpatient Medications on File Prior to Visit  Medication Sig Dispense Refill  . Acetaminophen (TYLENOL 8 HOUR PO) Take by mouth.    . clopidogrel (PLAVIX) 75 MG tablet Take 1 tablet (75 mg total) by mouth daily. 30 tablet 3  . co-enzyme Q-10 30 MG capsule Take 30 mg by mouth daily.    . divalproex (DEPAKOTE SPRINKLE) 125  MG capsule Take 1 capsule (125 mg total) by mouth daily. 90 capsule 3  . furosemide (LASIX) 40 MG tablet Take 1 tablet (40 mg total) by mouth 2 (two) times a week. 12 tablet 5  . ibuprofen (ADVIL) 200 MG tablet Take 200 mg by mouth as needed.    Marland Kitchen lisinopril (ZESTRIL) 5 MG tablet Take 5 mg by mouth daily.    Marland Kitchen MEGARED OMEGA-3 KRILL OIL 500 MG CAPS Take 1 capsule by mouth daily.    . metoprolol succinate (TOPROL-XL) 25 MG 24 hr tablet TAKE ONE TABLET BY MOUTH ONCE DAILY 30 tablet 9  . Multiple Vitamin (MULTIVITAMIN) tablet Take 1 tablet by mouth daily.    Marland Kitchen omeprazole (PRILOSEC) 20 MG capsule     . pravastatin (PRAVACHOL) 10 MG tablet Take 10 mg by mouth 3 (three) times a week.     . tamsulosin (FLOMAX) 0.4 MG CAPS capsule Take 0.4 mg by mouth daily.     No current facility-administered medications on file prior to visit.    Allergies  Allergen Reactions  . Tape Other (See Comments)    "PLASTIC" TAPE PULLS OFF THE SKIN AND THE REMOVAL OF ALL TAPES BRUISE HIM!!!    Objective: Physical Exam  General: Well developed, nourished, no acute distress, awake, alert and oriented x 3  Vascular: Dorsalis pedis artery 1/4 bilateral, Posterior tibial artery 0/4 bilateral, skin temperature warm to warm proximal to distal bilateral lower extremities, ++ varicosities, 1+ pitting edema bilateral R>L, diminished pedal hair present bilateral.  Neurological: Gross sensation present via light touch bilateral.   Dermatological: Skin is warm, dry, and supple bilateral, Nails 1-10 are tender, long, thick, and discolored  with mild subungal debris, no webspace macerations present bilateral, no open lesions present bilateral, no callus/corns/hyperkeratotic tissue present bilateral. No signs of infection bilateral.  Musculoskeletal: + foot drop and weakness on right.  Assessment and Plan:  Problem List Items Addressed This Visit      Cardiovascular and Mediastinum   Edema of lower extremity due to peripheral  venous insufficiency    Other Visit Diagnoses    Pain due to onychomycosis of toenails of both feet    -  Primary   PVD (peripheral vascular disease) ([place])       Foot drop, right       History of stroke         -Examined patient.  -Re-Discussed treatment options for painful mycotic nails. -Mechanically debrided and reduced mycotic nails with sterile nail nipper and dremel nail file without incident. -Continue with AFO brace and supportive shoes like before or walker for stability in gait -Patient to return in 3 months for follow up evaluation or sooner if symptoms worsen.  Landis Martins, DPM

## 2019-10-08 ENCOUNTER — Ambulatory Visit (INDEPENDENT_AMBULATORY_CARE_PROVIDER_SITE_OTHER): Payer: PPO | Admitting: *Deleted

## 2019-10-08 DIAGNOSIS — R55 Syncope and collapse: Secondary | ICD-10-CM | POA: Diagnosis not present

## 2019-10-08 LAB — CUP PACEART REMOTE DEVICE CHECK
Battery Remaining Longevity: 50 mo
Battery Remaining Percentage: 59 %
Battery Voltage: 2.96 V
Brady Statistic AP VP Percent: 30 %
Brady Statistic AP VS Percent: 1 %
Brady Statistic AS VP Percent: 69 %
Brady Statistic AS VS Percent: 1 %
Brady Statistic RA Percent Paced: 29 %
Date Time Interrogation Session: 20210624020019
HighPow Impedance: 63 Ohm
HighPow Impedance: 63 Ohm
Implantable Lead Implant Date: 20180926
Implantable Lead Implant Date: 20180926
Implantable Lead Implant Date: 20180926
Implantable Lead Location: 753858
Implantable Lead Location: 753859
Implantable Lead Location: 753860
Implantable Lead Model: 7122
Implantable Pulse Generator Implant Date: 20180926
Lead Channel Impedance Value: 430 Ohm
Lead Channel Impedance Value: 440 Ohm
Lead Channel Impedance Value: 700 Ohm
Lead Channel Pacing Threshold Amplitude: 0.5 V
Lead Channel Pacing Threshold Amplitude: 0.5 V
Lead Channel Pacing Threshold Amplitude: 1.25 V
Lead Channel Pacing Threshold Pulse Width: 0.5 ms
Lead Channel Pacing Threshold Pulse Width: 0.5 ms
Lead Channel Pacing Threshold Pulse Width: 0.8 ms
Lead Channel Sensing Intrinsic Amplitude: 11.7 mV
Lead Channel Sensing Intrinsic Amplitude: 3 mV
Lead Channel Setting Pacing Amplitude: 2 V
Lead Channel Setting Pacing Amplitude: 2.5 V
Lead Channel Setting Pacing Amplitude: 2.5 V
Lead Channel Setting Pacing Pulse Width: 0.5 ms
Lead Channel Setting Pacing Pulse Width: 0.8 ms
Lead Channel Setting Sensing Sensitivity: 0.5 mV
Pulse Gen Serial Number: 9764296

## 2019-10-08 NOTE — Progress Notes (Signed)
Remote ICD transmission.   

## 2019-10-13 DIAGNOSIS — M79601 Pain in right arm: Secondary | ICD-10-CM | POA: Diagnosis not present

## 2019-10-13 DIAGNOSIS — M79602 Pain in left arm: Secondary | ICD-10-CM | POA: Diagnosis not present

## 2019-10-13 DIAGNOSIS — F0391 Unspecified dementia with behavioral disturbance: Secondary | ICD-10-CM | POA: Diagnosis not present

## 2019-10-13 DIAGNOSIS — H6120 Impacted cerumen, unspecified ear: Secondary | ICD-10-CM | POA: Diagnosis not present

## 2019-10-13 IMAGING — DX DG CHEST 2V
2 series · 2 of 2 positions shown · non-contrast
Comparison: 05/31/2017

CLINICAL DATA: Elevated white count

EXAM:
CHEST  2 VIEW

[chest ap]
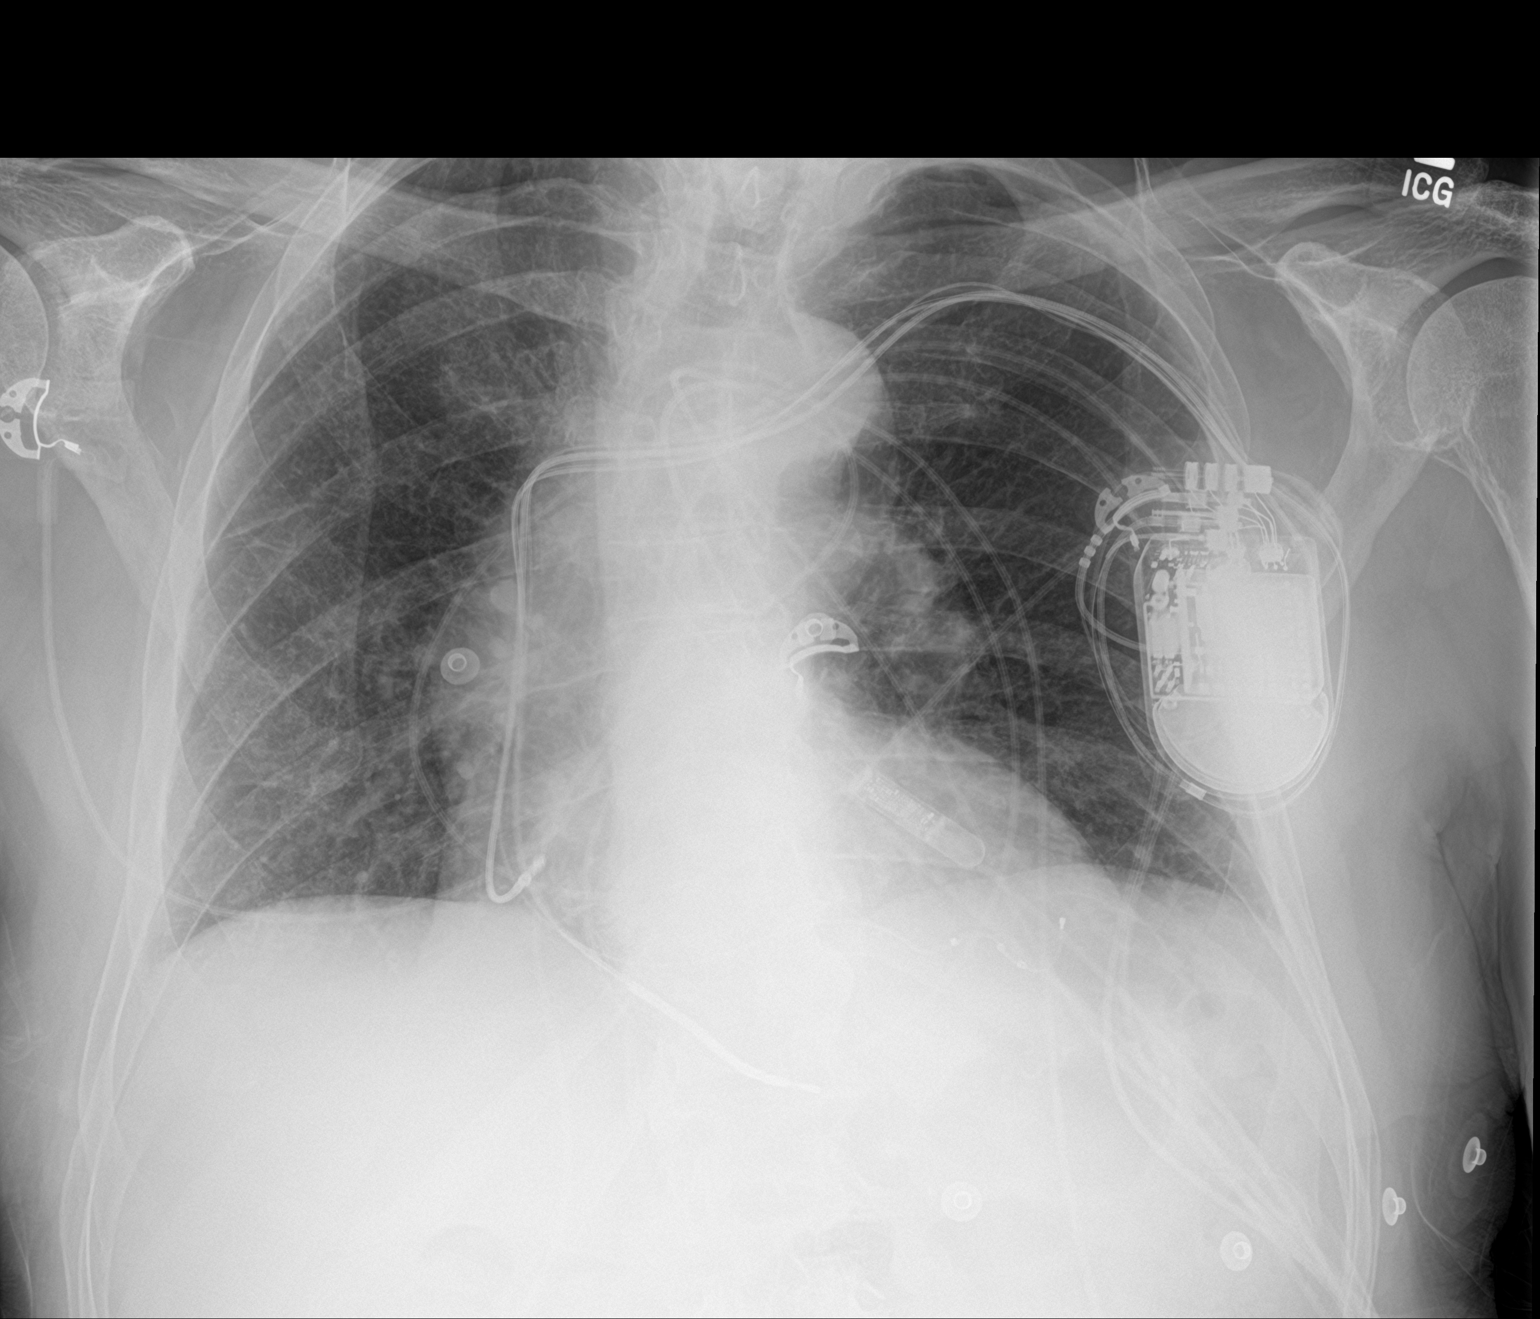

[chest lat]
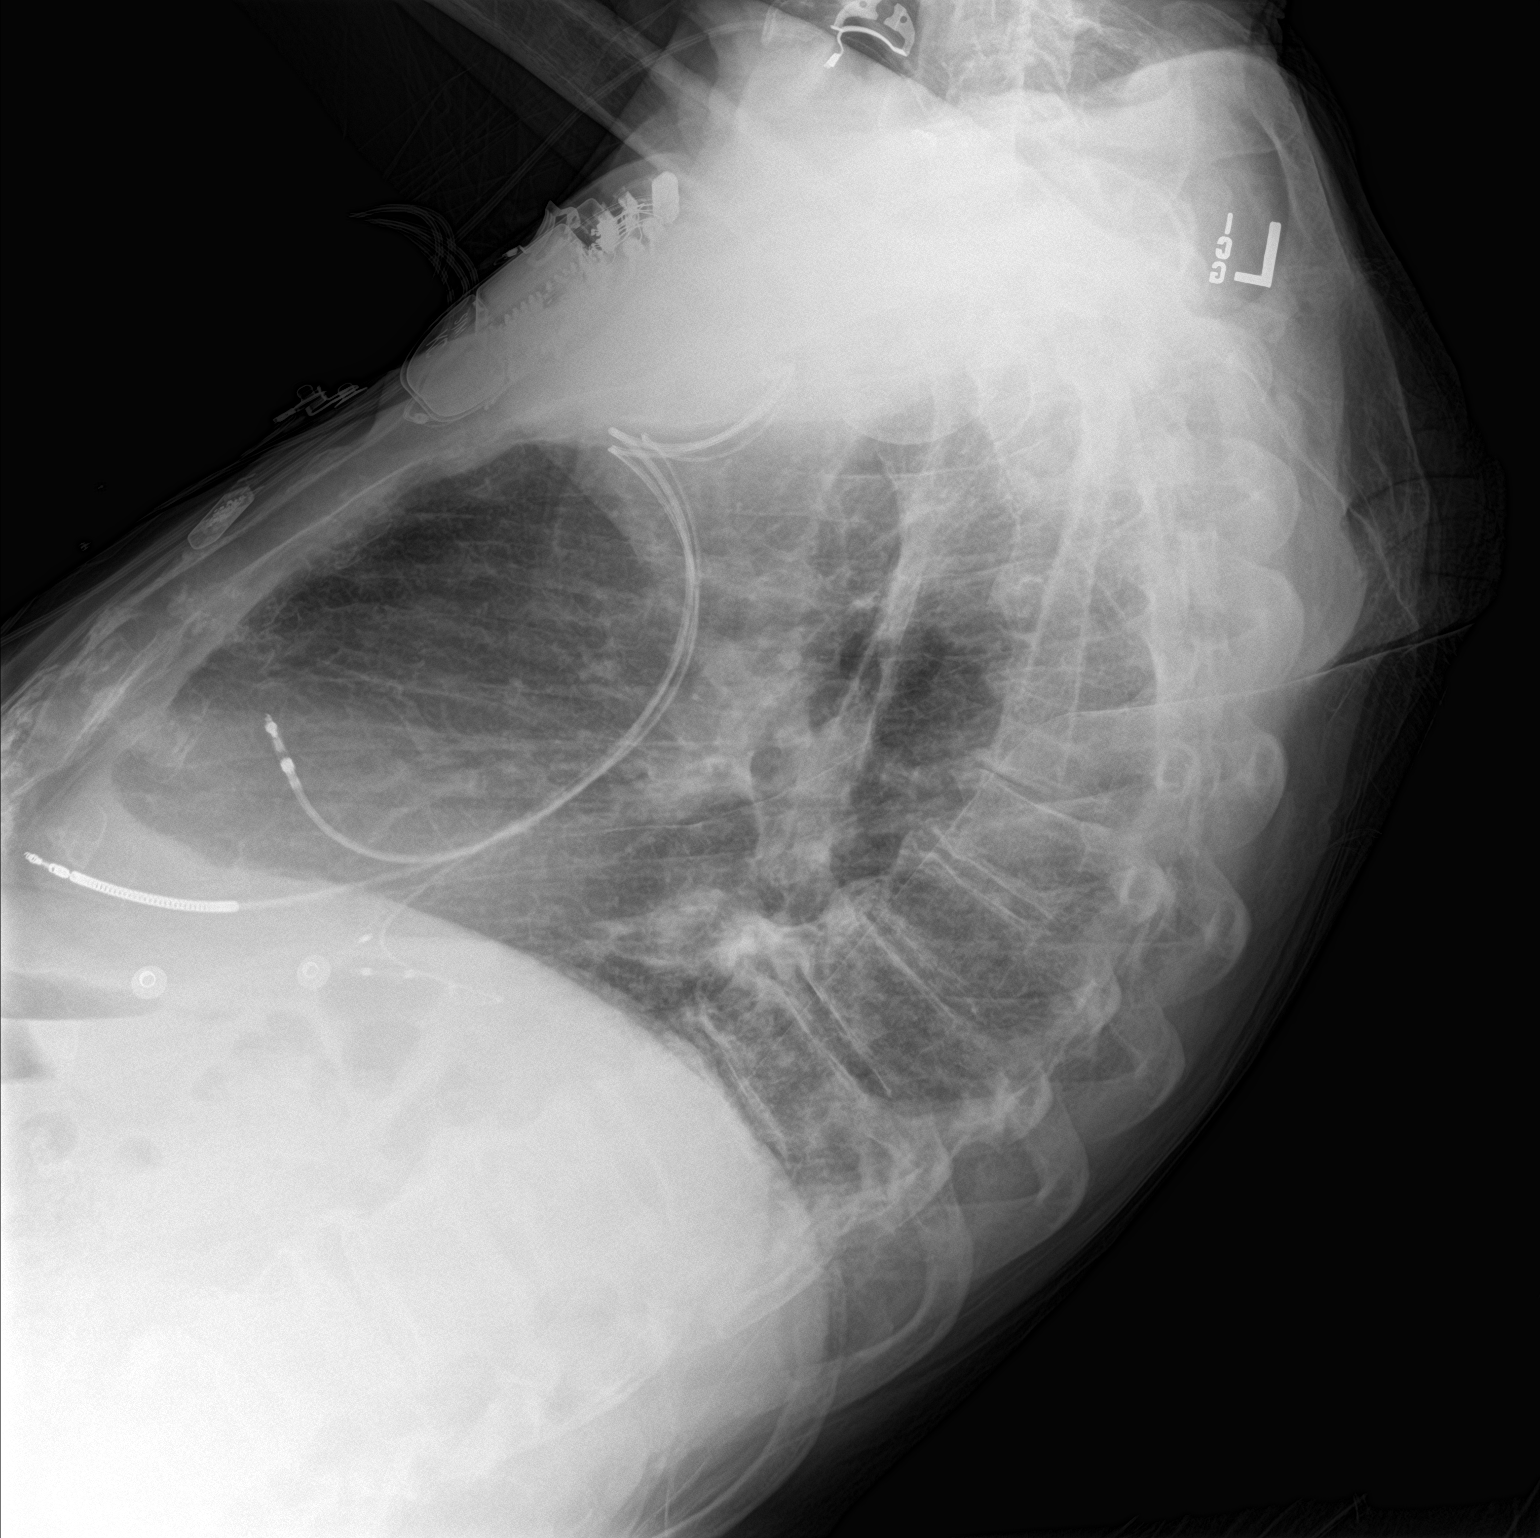

[2 of 2 positions shown; findings below may reference images not displayed]

FINDINGS: Left subclavian pacemaker/AICD noted with 3 leads as before.

Lower lung volumes accentuating the vascularity centrally. Mild
cardiac enlargement. No current CHF or edema. No significant focal
airspace process, collapse or consolidation. Negative for effusion
or pneumothorax. Aorta is atherosclerotic and tortuous. Degenerative
changes noted spine.
IMPRESSION: Lower lung volumes otherwise stable exam. No superimposed acute
chest process.

## 2019-10-20 ENCOUNTER — Telehealth: Payer: Self-pay | Admitting: Diagnostic Neuroimaging

## 2019-10-20 MED ORDER — DIVALPROEX SODIUM 125 MG PO CSDR: 125.0000 mg | DELAYED_RELEASE_CAPSULE | Freq: Two times a day (BID) | ORAL | 4 refills | Status: DC

## 2019-10-20 NOTE — Telephone Encounter (Signed)
Called pharmacy, spoke with Althia Forts, pharmacist. She stated that per patient's caregiver this office increased dose of divalproex to 125 mg twice daily. She needs new Rx; he will be out of medication tomorrow. Per notes on 09/11/19 Dr Leta Baptist increased dose. Advised pharmacist that NP and patient's MD are out of office. Will send request to MD. She verbalized understanding, appreciation.

## 2019-10-20 NOTE — Addendum Note (Signed)
Addended by: Andrey Spearman R on: 10/20/2019 05:01 PM   Modules accepted: Orders

## 2019-10-20 NOTE — Telephone Encounter (Signed)
Meds ordered this encounter  Medications  . divalproex (DEPAKOTE SPRINKLE) 125 MG capsule    Sig: Take 1 capsule (125 mg total) by mouth 2 (two) times daily.    Dispense:  180 capsule    Refill:  [place], MD 9/0/3833, 3:83 PM Certified in Neurology, Neurophysiology and [facility] [address] [place], [place] [address] [phone]

## 2019-10-20 NOTE — Telephone Encounter (Signed)
Pharmacist calling from  [place], [place] - [place] and needs new RX for  divalproex (DEPAKOTE SPRINKLE) 125 MG capsule sent .

## 2019-10-28 DIAGNOSIS — H6120 Impacted cerumen, unspecified ear: Secondary | ICD-10-CM | POA: Diagnosis not present

## 2019-11-06 DIAGNOSIS — D485 Neoplasm of uncertain behavior of skin: Secondary | ICD-10-CM | POA: Diagnosis not present

## 2019-11-06 DIAGNOSIS — L570 Actinic keratosis: Secondary | ICD-10-CM | POA: Diagnosis not present

## 2019-11-13 NOTE — Telephone Encounter (Signed)
[name],Gregory Rodgers(DAUGHTER on DPR) is asking for a call from RN to discuss pt's divalproex.  Daughter states pt's tempterment has changed and is hard to deal with.  Daughter is wanting to know if it is possible to increase pt's medication.

## 2019-11-16 NOTE — Telephone Encounter (Signed)
Called daughter and advised her of NP'S message. Patient is currently taking Depakote 125 mg twice daily. Ramona stated she will discuss with her mother. She would like to consider taking him to an adult day care to try and satisfy his need to "work". She has one locally she will visit. She'll call back with any decisions they make. She verbalized understanding, appreciation.

## 2019-11-16 NOTE — Telephone Encounter (Signed)
Currently on Depakote 250 mg twice daily.  We can try increasing dosage but family needs to be aware of increased potential side effects such as lethargy, increased risk for falls, dehydration and decreased appetite and to decide if benefit outweighs risk.  If there is any concern of underlying depression, may want to consider starting an SSRI such as sertraline which may also be beneficial.  He may continue to have these behaviors due to worsening of his dementia where it becomes difficult to manage behaviors due to medication side effects.  He may also benefit from referral to geriatric psychiatry who are more familiar with these type of medications and may have further options.  Please let me know what the family would like to do and we can proceed.  Thank you.

## 2019-11-16 NOTE — Telephone Encounter (Addendum)
Called daughter, Donnald Garre who stated depakote at night worked for a while. It was recently increased to twice daily. Now episodes increasing of him trying to do things he shouldn't be. He gets upset when family refuses, threatens to go outside, and he may be hallucinating, is seeing people who aren't there. Ramona asking if depakote should be increased again, or otherwise what can they do. I told her that his behavior issues can progress due to his dementia, will ask NP for advise and let her know. She  verbalized understanding, appreciation.

## 2019-12-03 ENCOUNTER — Encounter: Payer: Self-pay | Admitting: Cardiology

## 2019-12-03 ENCOUNTER — Other Ambulatory Visit: Payer: Self-pay

## 2019-12-03 ENCOUNTER — Ambulatory Visit: Payer: PPO | Admitting: Cardiology

## 2019-12-03 VITALS — BP 130/50 | HR 51 | Ht 70.0 in | Wt 175.0 lb

## 2019-12-03 DIAGNOSIS — R1013 Epigastric pain: Secondary | ICD-10-CM | POA: Diagnosis not present

## 2019-12-03 DIAGNOSIS — I10 Essential (primary) hypertension: Secondary | ICD-10-CM | POA: Diagnosis not present

## 2019-12-03 DIAGNOSIS — I4729 Other ventricular tachycardia: Secondary | ICD-10-CM

## 2019-12-03 DIAGNOSIS — I872 Venous insufficiency (chronic) (peripheral): Secondary | ICD-10-CM | POA: Diagnosis not present

## 2019-12-03 DIAGNOSIS — Z95810 Presence of automatic (implantable) cardiac defibrillator: Secondary | ICD-10-CM

## 2019-12-03 DIAGNOSIS — F0280 Dementia in other diseases classified elsewhere without behavioral disturbance: Secondary | ICD-10-CM

## 2019-12-03 DIAGNOSIS — I472 Ventricular tachycardia: Secondary | ICD-10-CM | POA: Diagnosis not present

## 2019-12-03 DIAGNOSIS — R001 Bradycardia, unspecified: Secondary | ICD-10-CM

## 2019-12-03 DIAGNOSIS — I255 Ischemic cardiomyopathy: Secondary | ICD-10-CM

## 2019-12-03 DIAGNOSIS — R079 Chest pain, unspecified: Secondary | ICD-10-CM | POA: Diagnosis not present

## 2019-12-03 DIAGNOSIS — Z9861 Coronary angioplasty status: Secondary | ICD-10-CM | POA: Diagnosis not present

## 2019-12-03 DIAGNOSIS — I2510 Atherosclerotic heart disease of native coronary artery without angina pectoris: Secondary | ICD-10-CM | POA: Diagnosis not present

## 2019-12-03 MED ORDER — ESOMEPRAZOLE MAGNESIUM 40 MG PO CPDR: 40.0000 mg | DELAYED_RELEASE_CAPSULE | Freq: Every day | ORAL | 3 refills | Status: DC

## 2019-12-03 MED ORDER — FUROSEMIDE 40 MG PO TABS: ORAL_TABLET | ORAL | 3 refills | Status: DC

## 2019-12-03 MED ORDER — ISOSORBIDE MONONITRATE ER 30 MG PO TB24: 30.0000 mg | ORAL_TABLET | Freq: Every day | ORAL | 3 refills | Status: DC

## 2019-12-03 NOTE — Progress Notes (Signed)
Primary Care Provider: Earlyne Iba, NP Cardiologist: Glenetta Hew, MD Electrophysiologist: [name], MD  Clinic Note: Chief Complaint  Patient presents with  . Follow-up    4 months  . Chest Pain    Difficult to fully assess    HPI:     Gregory Rodgers is a 86 y.o. 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 Lovena Le) who presents today for 66-month follow-up.   February 2019, Gregory Rodgers suffered Left MCA CVA: multiple small acute L MCA territory infarcts with irregular LICA 88% lesion.  No A. fib noted.  Was started on Plavix and atorvastatin.  He clearly has evidence of vascular dementia. Additional history includes B Cell CLL.  --> Prior to his April 2021 visit, he was last seen on November 02, 2017 at that time for delayed follow-up as well.  He has been mostly followed by Dr. Lovena Le.  Seem to have recovered from CVA just had some residual word loss and dysarthria.  Stable from cardiac standpoint.  Was improved with CRT-D.  No PND, orthopnea was stable mild edema.  Rare fluttering sensations. -->  No changes (had been converted from atorvastatin to pravastatin for memory and muscle pain issues. --> Dr. Lovena Le back in October 2020. Had done well with no major issues.  No chest pain pressure.  Notably more sedentary since his stroke.  Noted only class II CHF symptoms.  Gregory Rodgers was seen on August 11, 2019 year follow-up with chief complaint of leg swelling.  There was complaint of cellulitis in the right leg and ankle swelling.  There is mild bruising.  Leading up to this visit, he was treated with Rocephin follow-up Bactrim.  Dopplers were negative for DVT.  Recent Hospitalizations:   None  Reviewed  CV studies:    The following studies were reviewed today: (if available, images/films reviewed: From  Epic Chart or Care Everywhere) . No additional studies other than defibrillator check.   Interval History:   Gregory Rodgers is here today along with a believe is his daughter.  Gregory Rodgers appears always been on the go he keeps going without stopping for any concerns.  Legs seem to to be at home are better, still has bilateral swelling.  Back in July he had 1 or 2 profound weak spells with no residual issues.  Discussing with the family member it seems like [name] is a tendency to overdo it with activities 1 day and then vasopressin next day with being extremely fatigued.  Not associated with his weak spells, he did have a fall that appeared to be simply stumbling.  The main concern is letter has a day if he keeps pointing to the center of his chest going up and down saying there is something wrong there.  He cannot provide any more information than that.  He does not necessarily seem to have any regular activity, it can be related to lying down in certain angles.  Certain movements it may make it worse or not.  Unfortunately, he is not able to express because of aphasia. He does not appear to have any PND orthopnea edema.  Despite being on ago he always seems to note reduced energy levels.  Other than edema no real CHF symptoms.  CV Review of Symptoms (Summary) Cardiovascular ROS: positive for - edema and Episodes of extreme fatigue, and  the strange chest discomfort negative for - dyspnea on exertion, irregular heartbeat, orthopnea, palpitations, paroxysmal nocturnal dyspnea, rapid heart rate, shortness of breath or Syncope/near syncope or TIA/amaurosis fugax.  Daughter indicates that he is always on the go doing things he likes to drive his tractor lawnmower, but does not require.  Enjoys doing yard work, but does not do well bending over.  He cannot do well with down to the ground -> unable to get back up again.  The patient DOES NOT have symptoms concerning for COVID-19 infection  (fever, chills, cough, or new shortness of breath).  The patient is practicing social distancing & Masking.   He has had his Covid vaccines.  REVIEWED OF SYSTEMS   Review of Systems  Constitutional: Positive for malaise/fatigue (has good & bad days). Negative for weight loss.  HENT: Negative for congestion and nosebleeds.   Respiratory: Negative for cough and shortness of breath.   Cardiovascular: Positive for chest pain and leg swelling.  Gastrointestinal: Positive for abdominal pain. Negative for blood in stool, constipation and melena.  Genitourinary: Negative for hematuria (Not visible, but has had microscopic hematuria.).  Musculoskeletal: Positive for back pain and joint pain. Negative for falls.  Neurological: Negative for dizziness (Only when he is super tired, and when he lies down flat), speech change (Chronic aphasia) and focal weakness. Weakness: Generalized.  Psychiatric/Behavioral: Positive for memory loss. The patient is not nervous/anxious and does not have insomnia.    I have reviewed and (if needed) personally updated the patient's problem list, medications, allergies, past medical and surgical history, social and family history.   PAST MEDICAL HISTORY   Past Medical History:  Diagnosis Date  . Basal cell carcinoma    "numerous places"  . Borderline diabetes   . Cardiomyopathy, dilated ([place]) 03/2016   a) EF ~30% with diffuse HK (not simply ICM) ? partially due to LBBB (septal-lateral dyssynchrony). b) f/u Echo after BiV ICD = EF 45-50%  . CLL (chronic lymphocytic leukemia) ([place]) 09/2009   stage 0; on observation  . Coronary artery disease involving native heart without angina pectoris    PTCA of RCA in setting of an MI in 1986-87; no follow-up study since available.  . Essential hypertension   . History of kidney stones   . Hyperlipidemia with target LDL less than 70    With known CAD  . 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  Left bundle branch block (LBBB) on electrocardiogram    Noted as far back as 2008.  . Old inferior wall myocardial infarction 1986/87   Had PTCA most likely of the RCA    PAST SURGICAL HISTORY   Past Surgical History:  Procedure Laterality Date  . BASAL CELL CARCINOMA EXCISION     "numerous"  . BIV ICD INSERTION CRT-D N/A 01/09/2017   Procedure: BIV ICD INSERTION CRT-D;  Surgeon: Evans Lance, MD;  Location: [facility];  Service: Cardiovascular;  Laterality: N/A;  . CATARACT EXTRACTION W/ INTRAOCULAR LENS  IMPLANT, BILATERAL Bilateral   . CORONARY ANGIOPLASTY  1986/1987  . defibllator    . dual pacemaker    . EP IMPLANTABLE DEVICE N/A 05/10/2016   Procedure: Loop Recorder Insertion;  Surgeon: Evans Lance, MD;  Location: [facility];  Service: Cardiovascular;  Laterality: N/A;  slight blood drop on dressing  . JOINT REPLACEMENT    . MOHS SURGERY Left 01/02/2017   ear  .  REPLACEMENT TOTAL KNEE Left   . TEE WITHOUT CARDIOVERSION N/A 06/03/2017   Procedure: TRANSESOPHAGEAL ECHOCARDIOGRAM (TEE) - cancelled -   . TRANSTHORACIC ECHOCARDIOGRAM  03/2016   Normal LV size with mild LV hypertrophy. EF 30%. Diffuse  hypokinesis wtih septal-lateral dyssynchrony. Normal RV size and systolic function. Mild MR and mild AI.  Marland Kitchen TRANSTHORACIC ECHOCARDIOGRAM  06/03/2017   for CVA evaluation.  Diffuse hypokinesis EF 45 to 50% with moderate focal basal septal hypertrophy.  Moderate aortic insufficiency.  Large PFO with positive bubble study. -Possible source of embolus.  Ascending aortic aneurysm unchanged.    MEDICATIONS/ALLERGIES   Current Meds  Medication Sig  . Acetaminophen (TYLENOL 8 HOUR PO) Take by mouth.  . clopidogrel (PLAVIX) 75 MG tablet Take 1 tablet (75 mg total) by mouth daily.  Marland Kitchen co-enzyme Q-10 30 MG capsule Take 30 mg by mouth daily.  . divalproex (DEPAKOTE SPRINKLE) 125 MG capsule Take 1 capsule (125 mg total) by mouth 2 (two) times daily.  . furosemide (LASIX) 40  MG tablet Take 40 mg by mouth on Monday, Tuesday,Thursday, and Fridays only  . ibuprofen (ADVIL) 200 MG tablet Take 200 mg by mouth as needed.  Marland Kitchen lisinopril (ZESTRIL) 5 MG tablet Take 5 mg by mouth daily.  Marland Kitchen MEGARED OMEGA-3 KRILL OIL 500 MG CAPS Take 1 capsule by mouth daily.  . metoprolol succinate (TOPROL-XL) 25 MG 24 hr tablet TAKE ONE TABLET BY MOUTH ONCE DAILY  . Multiple Vitamin (MULTIVITAMIN) tablet Take 1 tablet by mouth daily.  . tamsulosin (FLOMAX) 0.4 MG CAPS capsule Take 0.4 mg by mouth daily.  . [DISCONTINUED] furosemide (LASIX) 40 MG tablet Take 1 tablet (40 mg total) by mouth 2 (two) times a week.  . [DISCONTINUED] omeprazole (PRILOSEC) 20 MG capsule   . [DISCONTINUED] pravastatin (PRAVACHOL) 10 MG tablet Take 10 mg by mouth 3 (three) times a week.     Allergies  Allergen Reactions  . Tape Other (See Comments)    "PLASTIC" TAPE PULLS OFF THE SKIN AND THE REMOVAL OF ALL TAPES BRUISE HIM!!!    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  No car driving. Alycia Patten -PE, EKG, labs   Wt Readings from Last 3 Encounters:  12/03/19 175 lb (79.4 kg)  08/11/19 181 lb 8 oz (82.3 kg)  08/04/19 182 lb (82.6 kg)    Physical Exam: BP (!) 130/50   Pulse (!) 51   Ht 5\' 10"  (1.778 m)   Wt 175 lb (79.4 kg)   SpO2 98%   BMI 25.11 kg/m  Physical Exam Vitals reviewed.  Constitutional:      General: He is not in acute distress.    Appearance: He is well-developed and normal weight. He is not ill-appearing.     Comments: Pleasant elderly gentleman.  No obvious distress.  Well-groomed.  HENT:     Head: Normocephalic and atraumatic.  Neck:     Vascular: No carotid bruit, hepatojugular reflux or JVD.  Cardiovascular:     Rate and Rhythm: Normal rate and regular rhythm.  No extrasystoles are present.    Chest Wall: PMI is not displaced.     Pulses: Decreased pulses (Mildly diminished pedal  pulses.).     Heart sounds: S1 normal. Heart sounds are distant. No murmur (Soft low pitched 1/6 DM at RUSB.) heard.  No friction rub. No gallop.      Comments: Physiologically split S2 Pulmonary:  Effort: Pulmonary effort is normal. No respiratory distress.     Breath sounds: Normal breath sounds. No wheezing.  Chest:     Chest wall: No tenderness.  Abdominal:     General: Abdomen is flat. Bowel sounds are normal. There is no distension.     Palpations: Abdomen is soft.     Tenderness: There is no abdominal tenderness.  Musculoskeletal:        General: Normal range of motion.     Cervical back: Normal range of motion and neck supple.  Skin:    Findings: No erythema.  Neurological:     Mental Status: He is alert. Mental status is at baseline.     Cranial Nerves: No cranial nerve deficit.     Comments: Difficult to really assess because of aphasia.  He does not necessarily answer questions very well.  Tries to say things but does not actually answer  Psychiatric:        Behavior: Behavior normal.     Comments: Somewhat of a poor historian.      Adult ECG Report  Rate: 51 ;  Rhythm: Atrial sensing, ventricular pacing.;  Biventricular pacing  Narrative Interpretation: Stable EKG.  Recent Labs:    07/15/2019: TC 117, TG 52, HDL 52, LDL 53  Lab Results  Component Value Date   CHOL 134 06/01/2017   HDL 37 (L) 06/01/2017   LDLCALC 81 06/01/2017   TRIG 80 06/01/2017   CHOLHDL 3.6 06/01/2017   Lab Results  Component Value Date   CREATININE 1.38 (H) 07/14/2019   BUN 21 07/14/2019   NA 137 07/14/2019   K 5.0 07/14/2019   CL 107 07/14/2019   CO2 22 07/14/2019   Lab Results  Component Value Date   TSH 2.480 12/01/2018    ASSESSMENT/PLAN    Problem List Items Addressed This Visit    Ischemic cardiomyopathy: EF 40-45% reduced to ~30% - post ICD up to 45-50%. (Chronic)    He had improvement of EF after CRT-D.  No real PND orthopnea.  He has chronic edema.  Plan:  Change his furosemide dosing to Monday, Tuesday, Thursday and Friday as well as as needed.      Relevant Medications   isosorbide mononitrate (IMDUR) 30 MG 24 hr tablet   furosemide (LASIX) 40 MG tablet   Edema of lower extremity due to peripheral venous insufficiency (Chronic)    For his edema, continue recommend trying support stockings.  Difficult to put them on.  Elevating his feet when possible. ->  Encouraging leg and foot exercises. Change furosemide dosing to Monday, Tuesday, Thursday &Friday.      Relevant Medications   isosorbide mononitrate (IMDUR) 30 MG 24 hr tablet   furosemide (LASIX) 40 MG tablet   Other Relevant Orders   EKG 12-Lead (Completed)   Essential hypertension (Chronic)    Blood pressure looks pretty good.  I am little bit concerned with his intermittent fatigue.  Would like to probably allow little more permissive hypertension.  We will reduce lisinopril to 25 mg daily. ->  This is probably to allow adding Imdur 30 mg      Relevant Medications   isosorbide mononitrate (IMDUR) 30 MG 24 hr tablet   furosemide (LASIX) 40 MG tablet   Other Relevant Orders   EKG 12-Lead (Completed)   CAD S/P percutaneous coronary angioplasty - Primary (Chronic)    Very distant history of angina.  Difficult to tell if he is truly having angina now.  Plan:  On stable low-dose metoprolol which we cannot titrate further.  On lisinopril.  We are decreasing to 2.5 mg daily for concern of weakness and fatigue.  Currently on statin, but with memory issues, and fatigue, will stop      Relevant Medications   isosorbide mononitrate (IMDUR) 30 MG 24 hr tablet   furosemide (LASIX) 40 MG tablet   Other Relevant Orders   EKG 12-Lead (Completed)   Polymorphic ventricular tachycardia (HCC) (Chronic)    History of V. tach-status post BiV ICD.  No further episodes.  Unable to tolerate low-dose beta-blocker      Relevant Medications   isosorbide mononitrate (IMDUR) 30 MG 24 hr  tablet   furosemide (LASIX) 40 MG tablet   ICD (implantable cardioverter-defibrillator) in place (Chronic)    Being monitored by Dr. Lovena Le and team      Relevant Orders   EKG 12-Lead (Completed)   Chest pain of uncertain etiology    Very difficult to understand what his symptoms are.  I cannot tell if this is angina or GERD.  Just not able to get enough information.  Discussing with his family as he has done before, he clearly would not be a candidate for ischemic evaluation as he would not be a good candidate for cardiac catheterization.  As such, we will opt for medical management.  Plan: Stop omeprazole and change to Nexium 40 mg daily and start Imdur 30 mg daily nightly  He is on clopidogrel for prior stroke.  Unable to titrate beta-blocker because of bradycardia  He has been on pravastatin however with his fatigue plan to stop.      Bradycardia with 41-50 beats per minute    Stable bradycardia.  Not able to titrate beta-blocker.      Dementia ([place])   Epigastric discomfort    Cannot tell if this distribution of symptoms he is describing is GI or cardiac.  Could be spasm or GERD versus cardiac.  Plan: We will convert from OTC omeprazole 20 mg to generic Nexium 40 mg daily.;  Add Imdur 30 mg daily.         COVID-19 Education: The signs and symptoms of COVID-19 were discussed with the patient and how to seek care for testing (follow up with PCP or arrange E-visit).   The importance of social distancing and COVID-19 vaccination was discussed today.  I spent a total of 61minutes with the patient and family in direct patient consultation.  Additional time spent with chart review  / charting (studies, outside notes, etc): 14 Total Time: 42 min  Current medicines are reviewed at length with the patient today.  (+/- concerns) n/a -> would prefer to avoid additional medications.  Notice: This dictation was prepared with Dragon dictation along with smaller phrase technology. Any  transcriptional errors that result from this process are unintentional and may not be corrected upon review.  Patient Instructions / Medication Changes & Studies & Tests Ordered   Patient Instructions  Medication Instructions:   stop taking Omeprazole Stop taking Pravastatin   Start taking Generic Nexium 40 mg  One tablet daily  Start taking Isosorbide mononitrate 30 mg one tablet daily preferable at night time      *If you need a refill on your cardiac medications before your next appointment, please call your pharmacy*   Lab Work: Not needed    Testing/Procedures: Not needed   Follow-Up: At [facility], you and your health needs are our priority.  As part of our continuing  mission to provide you with exceptional heart care, we have created designated Provider Care Teams.  These Care Teams include your primary Cardiologist (physician) and Advanced Practice Providers (APPs -  Physician Assistants and Nurse Practitioners) who all work together to provide you with the care you need, when you need it.  We recommend signing up for the patient portal called "MyChart".  Sign up information is provided on this After Visit Summary.  MyChart is used to connect with patients for Virtual Visits (Telemedicine).  Patients are able to view lab/test results, encounter notes, upcoming appointments, etc.  Non-urgent messages can be sent to your provider as well.   To learn more about what you can do with MyChart, go to [email or web address].    Your next appointment:   3 month(s)  The format for your next appointment:   Virtual Visit   Provider:   Glenetta Hew, MD   Other Instructions     Studies Ordered:   Orders Placed This Encounter  Procedures  . EKG 12-Lead     Glenetta Hew, M.D., M.S. Interventional Cardiologist   Pager # [phone] Phone # [phone] [address] [place], [place] [address]   Thank you for choosing Heartcare at  [facility]!!

## 2019-12-03 NOTE — Patient Instructions (Signed)
Medication Instructions:   stop taking Omeprazole Stop taking Pravastatin   Start taking Generic Nexium 40 mg  One tablet daily  Start taking Isosorbide mononitrate 30 mg one tablet daily preferable at night time      *If you need a refill on your cardiac medications before your next appointment, please call your pharmacy*   Lab Work: Not needed    Testing/Procedures: Not needed   Follow-Up: At [facility], you and your health needs are our priority.  As part of our continuing mission to provide you with exceptional heart care, we have created designated Provider Care Teams.  These Care Teams include your primary Cardiologist (physician) and Advanced Practice Providers (APPs -  Physician Assistants and Nurse Practitioners) who all work together to provide you with the care you need, when you need it.  We recommend signing up for the patient portal called "MyChart".  Sign up information is provided on this After Visit Summary.  MyChart is used to connect with patients for Virtual Visits (Telemedicine).  Patients are able to view lab/test results, encounter notes, upcoming appointments, etc.  Non-urgent messages can be sent to your provider as well.   To learn more about what you can do with MyChart, go to [email or web address].    Your next appointment:   3 month(s)  The format for your next appointment:   Virtual Visit   Provider:   Glenetta Hew, MD   Other Instructions

## 2019-12-09 ENCOUNTER — Encounter: Payer: Self-pay | Admitting: Cardiology

## 2019-12-09 DIAGNOSIS — R079 Chest pain, unspecified: Secondary | ICD-10-CM | POA: Insufficient documentation

## 2019-12-09 NOTE — Assessment & Plan Note (Signed)
History of V. tach-status post BiV ICD.  No further episodes.  Unable to tolerate low-dose beta-blocker

## 2019-12-09 NOTE — Assessment & Plan Note (Signed)
Being monitored by Dr. Lovena Le and team

## 2019-12-09 NOTE — Assessment & Plan Note (Signed)
Stable bradycardia.  Not able to titrate beta-blocker.

## 2019-12-09 NOTE — Assessment & Plan Note (Addendum)
Very difficult to understand what his symptoms are.  I cannot tell if this is angina or GERD.  Just not able to get enough information.  Discussing with his family as he has done before, he clearly would not be a candidate for ischemic evaluation as he would not be a good candidate for cardiac catheterization.  As such, we will opt for medical management.  Plan: Stop omeprazole and change to Nexium 40 mg daily and start Imdur 30 mg daily nightly  He is on clopidogrel for prior stroke.  Unable to titrate beta-blocker because of bradycardia  He has been on pravastatin however with his fatigue plan to stop.

## 2019-12-09 NOTE — Assessment & Plan Note (Addendum)
Very distant history of angina.  Difficult to tell if he is truly having angina now.  Plan:  On stable low-dose metoprolol which we cannot titrate further.  On lisinopril.  We are decreasing to 2.5 mg daily for concern of weakness and fatigue.  Currently on statin, but with memory issues, and fatigue, will stop

## 2019-12-09 NOTE — Assessment & Plan Note (Signed)
Cannot tell if this distribution of symptoms he is describing is GI or cardiac.  Could be spasm or GERD versus cardiac.  Plan: We will convert from OTC omeprazole 20 mg to generic Nexium 40 mg daily.;  Add Imdur 30 mg daily.

## 2019-12-09 NOTE — Assessment & Plan Note (Signed)
Blood pressure looks pretty good.  I am little bit concerned with his intermittent fatigue.  Would like to probably allow little more permissive hypertension.  We will reduce lisinopril to 25 mg daily. ->  This is probably to allow adding Imdur 30 mg

## 2019-12-09 NOTE — Assessment & Plan Note (Signed)
For his edema, continue recommend trying support stockings.  Difficult to put them on.  Elevating his feet when possible. ->  Encouraging leg and foot exercises. Change furosemide dosing to Monday, Tuesday, Thursday &Friday.

## 2019-12-09 NOTE — Assessment & Plan Note (Signed)
He had improvement of EF after CRT-D.  No real PND orthopnea.  He has chronic edema.  Plan: Change his furosemide dosing to Monday, Tuesday, Thursday and Friday as well as as needed.

## 2019-12-29 ENCOUNTER — Ambulatory Visit: Payer: PPO | Admitting: Sports Medicine

## 2019-12-30 DIAGNOSIS — Z139 Encounter for screening, unspecified: Secondary | ICD-10-CM | POA: Diagnosis not present

## 2019-12-30 DIAGNOSIS — Z0000 Encounter for general adult medical examination without abnormal findings: Secondary | ICD-10-CM | POA: Diagnosis not present

## 2019-12-30 DIAGNOSIS — E785 Hyperlipidemia, unspecified: Secondary | ICD-10-CM | POA: Diagnosis not present

## 2019-12-30 DIAGNOSIS — Z9181 History of falling: Secondary | ICD-10-CM | POA: Diagnosis not present

## 2019-12-30 DIAGNOSIS — Z1331 Encounter for screening for depression: Secondary | ICD-10-CM | POA: Diagnosis not present

## 2020-01-07 ENCOUNTER — Ambulatory Visit (INDEPENDENT_AMBULATORY_CARE_PROVIDER_SITE_OTHER): Payer: PPO | Admitting: Emergency Medicine

## 2020-01-07 DIAGNOSIS — R55 Syncope and collapse: Secondary | ICD-10-CM

## 2020-01-07 LAB — CUP PACEART REMOTE DEVICE CHECK
Battery Remaining Longevity: 47 mo
Battery Remaining Percentage: 56 %
Battery Voltage: 2.95 V
Brady Statistic AP VP Percent: 30 %
Brady Statistic AP VS Percent: 1 %
Brady Statistic AS VP Percent: 69 %
Brady Statistic AS VS Percent: 1 %
Brady Statistic RA Percent Paced: 30 %
Date Time Interrogation Session: 20210923020018
HighPow Impedance: 65 Ohm
HighPow Impedance: 65 Ohm
Implantable Lead Implant Date: 20180926
Implantable Lead Implant Date: 20180926
Implantable Lead Implant Date: 20180926
Implantable Lead Location: 753858
Implantable Lead Location: 753859
Implantable Lead Location: 753860
Implantable Lead Model: 7122
Implantable Pulse Generator Implant Date: 20180926
Lead Channel Impedance Value: 410 Ohm
Lead Channel Impedance Value: 410 Ohm
Lead Channel Impedance Value: 700 Ohm
Lead Channel Pacing Threshold Amplitude: 0.5 V
Lead Channel Pacing Threshold Amplitude: 0.5 V
Lead Channel Pacing Threshold Amplitude: 1.25 V
Lead Channel Pacing Threshold Pulse Width: 0.5 ms
Lead Channel Pacing Threshold Pulse Width: 0.5 ms
Lead Channel Pacing Threshold Pulse Width: 0.8 ms
Lead Channel Sensing Intrinsic Amplitude: 2.1 mV
Lead Channel Sensing Intrinsic Amplitude: 9.2 mV
Lead Channel Setting Pacing Amplitude: 2 V
Lead Channel Setting Pacing Amplitude: 2.5 V
Lead Channel Setting Pacing Amplitude: 2.5 V
Lead Channel Setting Pacing Pulse Width: 0.5 ms
Lead Channel Setting Pacing Pulse Width: 0.8 ms
Lead Channel Setting Sensing Sensitivity: 0.5 mV
Pulse Gen Serial Number: 9764296

## 2020-01-12 NOTE — Progress Notes (Signed)
Remote ICD transmission.   

## 2020-01-13 ENCOUNTER — Other Ambulatory Visit: Payer: Self-pay

## 2020-01-13 ENCOUNTER — Encounter: Payer: Self-pay | Admitting: Sports Medicine

## 2020-01-13 ENCOUNTER — Ambulatory Visit (INDEPENDENT_AMBULATORY_CARE_PROVIDER_SITE_OTHER): Payer: PPO | Admitting: Sports Medicine

## 2020-01-13 DIAGNOSIS — I739 Peripheral vascular disease, unspecified: Secondary | ICD-10-CM

## 2020-01-13 DIAGNOSIS — B351 Tinea unguium: Secondary | ICD-10-CM

## 2020-01-13 DIAGNOSIS — M79674 Pain in right toe(s): Secondary | ICD-10-CM | POA: Diagnosis not present

## 2020-01-13 DIAGNOSIS — I872 Venous insufficiency (chronic) (peripheral): Secondary | ICD-10-CM

## 2020-01-13 DIAGNOSIS — M79675 Pain in left toe(s): Secondary | ICD-10-CM

## 2020-01-13 DIAGNOSIS — Z8673 Personal history of transient ischemic attack (TIA), and cerebral infarction without residual deficits: Secondary | ICD-10-CM

## 2020-01-13 DIAGNOSIS — M21371 Foot drop, right foot: Secondary | ICD-10-CM

## 2020-01-13 NOTE — Progress Notes (Signed)
Subjective: Safir Michalec is a 86 y.o. male patient seen today in office with complaint of mildly painful thickened and elongated toenails; unable to trim.  Patient denies any changes with medical history since last encounter,  Still on Plavix like before.  No other pedal complaints noted.  Patient is also assisted by wife who will be seen this visit as well. Patient Active Problem List   Diagnosis Date Noted  . Chest pain of uncertain etiology 12/45/8099  . Epigastric discomfort 12/03/2019  . Edema of lower extremity due to peripheral venous insufficiency 08/16/2019  . Nonischemic cardiomyopathy ([place]) 02/13/2019  . ICD (implantable cardioverter-defibrillator) in place 02/13/2019  . Dementia ([place]) 12/17/2018  . Near syncope 11/02/2017  . History of skin cancer 09/23/2017  . Transient speech disturbance 06/01/2017  . 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 Cerebral embolism with cerebral infarction 06/01/2017  . Polymorphic ventricular tachycardia ([place]) 01/09/2017  . NSVT (nonsustained ventricular tachycardia) ([place]) 05/01/2016  . Left bundle branch block (LBBB) on electrocardiogram 02/27/2016  . Dizziness of unknown cause 02/27/2016  . Bradycardia with 41-50 beats per minute 02/27/2016  . Ischemic cardiomyopathy: EF 40-45% reduced to ~30% - post ICD up to 45-50%. 02/27/2016  . Pancytopenia, acquired ([place]) 09/30/2015  . Thrombocytopenia ([place]) 10/01/2014  . 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 CLL (chronic lymphocytic leukemia) ([place]) 06/20/2011  . Hyperlipidemia 06/20/2011  . Essential hypertension 06/20/2011  . Borderline diabetes 06/20/2011  . CAD S/P percutaneous coronary angioplasty 02/26/1985    Current Outpatient Medications on File Prior to Visit  Medication Sig Dispense Refill  . Acetaminophen (TYLENOL 8 HOUR PO) Take by mouth.    . clopidogrel (PLAVIX) 75 MG tablet Take 1 tablet (75 mg total) by mouth daily. 30 tablet 3  . co-enzyme Q-10 30 MG  capsule Take 30 mg by mouth daily.    . divalproex (DEPAKOTE SPRINKLE) 125 MG capsule Take 1 capsule (125 mg total) by mouth 2 (two) times daily. 180 capsule 4  . esomeprazole (NEXIUM) 40 MG capsule Take 1 capsule (40 mg total) by mouth daily at 12 noon. 90 capsule 3  . furosemide (LASIX) 40 MG tablet Take 40 mg by mouth on Monday, Tuesday,Thursday, and Fridays only 90 tablet 3  . ibuprofen (ADVIL) 200 MG tablet Take 200 mg by mouth as needed.    . isosorbide mononitrate (IMDUR) 30 MG 24 hr tablet Take 1 tablet (30 mg total) by mouth daily. 90 tablet 3  . lisinopril (ZESTRIL) 5 MG tablet Take 5 mg by mouth daily.    Marland Kitchen MEGARED OMEGA-3 KRILL OIL 500 MG CAPS Take 1 capsule by mouth daily.    . metoprolol succinate (TOPROL-XL) 25 MG 24 hr tablet TAKE ONE TABLET BY MOUTH ONCE DAILY 30 tablet 9  . Multiple Vitamin (MULTIVITAMIN) tablet Take 1 tablet by mouth daily.    . tamsulosin (FLOMAX) 0.4 MG CAPS capsule Take 0.4 mg by mouth daily.     No current facility-administered medications on file prior to visit.    Allergies  Allergen Reactions  . Tape Other (See Comments)    "PLASTIC" TAPE PULLS OFF THE SKIN AND THE REMOVAL OF ALL TAPES BRUISE HIM!!!    Objective: Physical Exam  General: Well developed, nourished, no acute distress, awake, alert and oriented x 3  Vascular: Dorsalis pedis artery 1/4 bilateral, Posterior tibial artery 0/4 bilateral, skin temperature warm to warm proximal to distal bilateral lower extremities, ++ varicosities, 1+ pitting edema bilateral R>L, diminished pedal hair present bilateral.  Neurological: Gross sensation present via light touch bilateral.   Dermatological: Skin is warm, dry, and supple bilateral, Nails 1-10 are tender, long, thick, and discolored with mild subungal debris, no webspace macerations present bilateral, no open lesions present bilateral, no callus/corns/hyperkeratotic tissue present bilateral. No signs of infection bilateral.  Musculoskeletal:  + foot drop and weakness on right.  Assessment and Plan:  Problem List Items Addressed This Visit      Cardiovascular and Mediastinum   Edema of lower extremity due to peripheral venous insufficiency (Chronic)    Other Visit Diagnoses    Pain due to onychomycosis of toenails of both feet    -  Primary   PVD (peripheral vascular disease) (HCC)       Foot drop, right       History of stroke         -Examined patient.  -Re-Discussed treatment options for painful mycotic nails. -Mechanically debrided and reduced mycotic nails with sterile nail nipper and dremel nail file without incident. -Continue with AFO brace and supportive shoes like before or walker for stability in gait to prevent against falls -Patient to return in 3 months for follow up evaluation or sooner if symptoms worsen.  Landis Martins, DPM

## 2020-02-03 ENCOUNTER — Encounter: Payer: Self-pay | Admitting: Adult Health

## 2020-02-03 ENCOUNTER — Ambulatory Visit: Payer: PPO | Admitting: Adult Health

## 2020-02-03 VITALS — BP 148/54 | HR 50 | Ht 70.0 in | Wt 174.0 lb

## 2020-02-03 DIAGNOSIS — F0151 Vascular dementia with behavioral disturbance: Secondary | ICD-10-CM | POA: Diagnosis not present

## 2020-02-03 DIAGNOSIS — F01518 Vascular dementia, unspecified severity, with other behavioral disturbance: Secondary | ICD-10-CM

## 2020-02-03 DIAGNOSIS — R4189 Other symptoms and signs involving cognitive functions and awareness: Secondary | ICD-10-CM | POA: Diagnosis not present

## 2020-02-03 DIAGNOSIS — Z8673 Personal history of transient ischemic attack (TIA), and cerebral infarction without residual deficits: Secondary | ICD-10-CM

## 2020-02-03 MED ORDER — DIVALPROEX SODIUM 125 MG PO CSDR: 250.0000 mg | DELAYED_RELEASE_CAPSULE | Freq: Two times a day (BID) | ORAL | 3 refills | Status: DC

## 2020-02-03 NOTE — Progress Notes (Signed)
[name] [address] [place] [address] [phone]       OFFICE FOLLOW UP NOTE  [name] Date of Birth:  1927/08/29 Medical Record Number:  [id number]   Reason for visit: Vascular dementia with behaviors and prior history of stroke   SUBJECTIVE:  CHIEF COMPLAINT:  Chief Complaint  Patient presents with   Follow-up    RM 9   Dementia    HPI:  Today, 02/03/2020, [name] returns for 29-month follow-up regarding history of stroke accompanied by his wife and daughter.  They have multiple concerns today regarding gradual decline of likely vascular dementia and worsening behaviors.  Currently on Depakote 125 mg twice daily tolerating well without side effects.  Behaviors initially improved after starting Depakote but have been gradually worsening.  Wife and daughter report frequent agitation, outbursts, delusions, occasional wandering at night or during the day and difficulty redirecting.  Daughters have been staying with patient and wife over the past month due to needing further assistance.  Residual stroke deficits of aphasia have been stable but due to difficulty understanding patient, wife believes this is part of his frequent agitation.  Remains on Plavix without bleeding or bruising. Per wife, cardiologist recently discontinued pravastatin for unknown reason.  Blood pressure today 148/54.  Loop recorder has not shown atrial fibrillation thus far.  No further concerns at this time.     ROS:   14 system review of systems performed and negative with exception of see HPI   PMH:  Past Medical History:  Diagnosis Date   Basal cell carcinoma    "numerous places"   Borderline diabetes    Cardiomyopathy, dilated ([place]) 03/2016   a) EF ~30% with diffuse HK (not simply ICM) ? partially due to LBBB (septal-lateral dyssynchrony). b) f/u Echo after BiV ICD = EF 45-50%   CLL (chronic lymphocytic leukemia) ([place]) 09/2009   stage 0; on  observation   Coronary artery disease involving native heart without angina pectoris    PTCA of RCA in setting of an MI in 1986-87; no follow-up study since available.   Essential hypertension    History of kidney stones    Hyperlipidemia with target LDL less than 70    With known CAD   L MCA CVA - wtih L ICA 60% lesion & thrombus in Carotid Bulb 05/2017   Left bundle branch block (LBBB) on electrocardiogram    Noted as far back as 2008.   Old inferior wall myocardial infarction 1986/87   Had PTCA most likely of the RCA    PSH:  Past Surgical History:  Procedure Laterality Date   BASAL CELL CARCINOMA EXCISION     "numerous"   BIV ICD INSERTION CRT-D N/A 01/09/2017   Procedure: BIV ICD INSERTION CRT-D;  Surgeon: Evans Lance, MD;  Location: [facility];  Service: Cardiovascular;  Laterality: N/A;   CATARACT EXTRACTION W/ INTRAOCULAR LENS  IMPLANT, BILATERAL Bilateral    CORONARY ANGIOPLASTY  1986/1987   defibllator     dual pacemaker     EP IMPLANTABLE DEVICE N/A 05/10/2016   Procedure: Loop Recorder Insertion;  Surgeon: Evans Lance, MD;  Location: [facility];  Service: Cardiovascular;  Laterality: N/A;  slight blood drop on dressing   JOINT REPLACEMENT     MOHS SURGERY Left 01/02/2017   ear   REPLACEMENT TOTAL KNEE Left    TEE WITHOUT CARDIOVERSION N/A 06/03/2017   Procedure: TRANSESOPHAGEAL ECHOCARDIOGRAM (TEE) - cancelled -  TRANSTHORACIC ECHOCARDIOGRAM  03/2016   Normal LV size with mild LV hypertrophy. EF 30%. Diffuse  hypokinesis wtih septal-lateral dyssynchrony. Normal RV size and systolic function. Mild MR and mild AI.   TRANSTHORACIC ECHOCARDIOGRAM  06/03/2017   for CVA evaluation.  Diffuse hypokinesis EF 45 to 50% with moderate focal basal septal hypertrophy.  Moderate aortic insufficiency.  Large PFO with positive bubble study. -Possible source of embolus.  Ascending aortic aneurysm unchanged.    Social History:  Social History    Socioeconomic History   Marital status: Married    Spouse name: Not on file   Number of children: Not on file   Years of education: Not on file   Highest education level: Not on file  Occupational History   Not on file  Tobacco Use   Smoking status: Never Smoker   Smokeless tobacco: Never Used  Vaping Use   Vaping Use: Never used  Substance and Sexual Activity   Alcohol use: No   Drug use: No   Sexual activity: Not on file  Other Topics Concern   Not on file  Social History Narrative   Not on file   Social Determinants of Health   Financial Resource Strain:    Difficulty of Paying Living Expenses: Not on file  Food Insecurity:    Worried About [place] in the Last Year: Not on file   Ran Out of Food in the Last Year: Not on file  Transportation Needs:    Lack of Transportation (Medical): Not on file   Lack of Transportation (Non-Medical): Not on file  Physical Activity:    Days of Exercise per Week: Not on file   Minutes of Exercise per Session: Not on file  Stress:    Feeling of Stress : Not on file  Social Connections:    Frequency of Communication with Friends and Family: Not on file   Frequency of Social Gatherings with Friends and Family: Not on file   Attends Religious Services: Not on file   Active Member of Clubs or Organizations: Not on file   Attends Archivist Meetings: Not on file   Marital Status: Not on file  Intimate Partner Violence:    Fear of Current or Ex-Partner: Not on file   Emotionally Abused: Not on file   Physically Abused: Not on file   Sexually Abused: Not on file    Family History:  Family History  Problem Relation Age of Onset   Sudden Cardiac Death Neg Hx     Medications:   Current Outpatient Medications on File Prior to Visit  Medication Sig Dispense Refill   Acetaminophen (TYLENOL 8 HOUR PO) Take by mouth.     clopidogrel (PLAVIX) 75 MG tablet Take 1 tablet (75 mg  total) by mouth daily. 30 tablet 3   co-enzyme Q-10 30 MG capsule Take 30 mg by mouth daily.     divalproex (DEPAKOTE SPRINKLE) 125 MG capsule Take 1 capsule (125 mg total) by mouth 2 (two) times daily. 180 capsule 4   furosemide (LASIX) 40 MG tablet Take 40 mg by mouth on Monday, Tuesday,Thursday, and Fridays only 90 tablet 3   ibuprofen (ADVIL) 200 MG tablet Take 200 mg by mouth as needed.     isosorbide mononitrate (IMDUR) 30 MG 24 hr tablet Take 1 tablet (30 mg total) by mouth daily. 90 tablet 3   lisinopril (ZESTRIL) 5 MG tablet Take 5 mg by mouth daily.     MEGARED OMEGA-3  KRILL OIL 500 MG CAPS Take 1 capsule by mouth daily.     metoprolol succinate (TOPROL-XL) 25 MG 24 hr tablet TAKE ONE TABLET BY MOUTH ONCE DAILY 30 tablet 9   Multiple Vitamin (MULTIVITAMIN) tablet Take 1 tablet by mouth daily.     tamsulosin (FLOMAX) 0.4 MG CAPS capsule Take 0.4 mg by mouth daily.     No current facility-administered medications on file prior to visit.    Allergies:   Allergies  Allergen Reactions   Tape Other (See Comments)    "PLASTIC" TAPE PULLS OFF THE SKIN AND THE REMOVAL OF ALL TAPES BRUISE HIM!!!     OBJECTIVE:   Vitals  Vitals:   02/03/20 1326  BP: (!) 148/54  Pulse: (!) 50  Weight: 174 lb (78.9 kg)  Height: 5\' 10"  (1.778 m)   Body mass index is 24.97 kg/m. No exam data present   Physical exam  General: well developed, pleasant elderly Caucasian male seated, in no evident distress Head: head normocephalic and atraumatic.   Neck: supple with no carotid or supraclavicular bruits Cardiovascular: regular rate and rhythm, no murmurs Musculoskeletal: no deformity Skin:  no rash/petichiae Vascular:  Normal pulses all extremities  Neurologic Exam Mental Status: Awake and fully alert.  Moderate to severe expressive aphasia.  Difficulty fully assessing cognition and orientation due to aphasia.  Unable to obtain MMSE due to language and hearing impairment.   Attention span, concentration and fund of knowledge diminished. Mood and affect appropriate.   Cranial Nerves: Pupils equal, briskly reactive to light. Extraocular movements full without nystagmus. Visual fields full to confrontation.  Severely HOH bilaterally. Facial sensation intact. Face, tongue, palate moves normally and symmetrically.  Motor: Normal bulk and tone. Normal strength in all tested extremity muscles. Sensory.: intact to touch , pinprick , position and vibratory sensation.  Coordination: Rapid alternating movements normal in all extremities. Finger-to-nose and heel-to-shin performed accurately bilaterally. Gait and Station: Arises from chair without difficulty. Stance is normal. Gait demonstrates normal stride length and balance without use of assistive device Reflexes: 1+ and symmetric. Toes downgoing.      ASSESSMENT/PLAN:   Gregory Rodgers is a 86 y.o. year old male here history of left MCA cryptogenic stroke on 06/01/2017 with residual expressive aphasia and cognitive impairment.  Loop recorder placed which has not shown atrial fibrillation thus far.  Vascular risk factors include HTN, HLD, borderline DM, CHF, cardiomyopathy s/p biventricular pacemaker/AICD and CAD with MI.    Vascular dementia with behaviors -Gradual decline with worsening behaviors -recommend increasing Depakote to 250 mg twice daily.  Discussed potential side effects and family wishes to proceed.  They are aware to call in the near future for possible need of increase or potential side effect concerns -Long discussion regarding typical course of dementia especially in regards to behaviors and certain things to do at home to try to prevent agitation or specific triggers.  -Great difficulty fully assessing cognition level due to language impairment post stroke and severely hard of hearing -Discussed importance of maintaining safety for both patient and family.  They may need to start considering looking  into higher level of care which was further discussed   Hx of stroke -Residual deficits of expressive aphasia and cognitive impairment -Continue clopidogrel 75 mg daily for secondary stroke prevention -Cardiologist recently discontinued pravastatin -advised family to follow-up with cardiology regarding indication for discontinuing and recommendation from a stroke standpoint to continue on low-dose statin for secondary stroke prevention but as this may have  been discontinued for specific reason, restarting will be deferred to cardiology -F/u with PCP regarding your HLD, HTN and CAD management -Continue to monitor loop recorder for possible atrial fibrillation -continue to monitor BP at home -Maintain strict control of hypertension with blood pressure goal below 130/90 and cholesterol with LDL cholesterol (bad cholesterol) goal below 70 mg/dL.    Follow-up in 6 months or call earlier if needed   I spent a prolonged 40 minutes of face-to-face and non-face-to-face time with patient, wife and daughter.  This included previsit chart review, lab review, study review, order entry, electronic health record documentation, patient and family education and discussion regarding likely vascular dementia with behaviors, history of prior stroke, importance of managing stroke risk factors and answered all the questions to patient, wife's and daughter satisfaction  Frann Rider, [facility]  [facility] [address] [place], [place] [address]  Phone [phone] Fax [phone]

## 2020-02-03 NOTE — Patient Instructions (Addendum)
Recommend increased Depakote 250mg  twice daily   Additional information provided      Follow up in 6 months or call earlier if needed      Thank you for coming to see [place] at [facility]. I hope we have been able to provide you high quality care today.  You may receive a patient satisfaction survey over the next few weeks. We would appreciate your feedback and comments so that we may continue to improve ourselves and the health of our patients.

## 2020-02-05 NOTE — Progress Notes (Signed)
I agree with the above plan 

## 2020-02-08 DIAGNOSIS — Z23 Encounter for immunization: Secondary | ICD-10-CM | POA: Diagnosis not present

## 2020-02-08 DIAGNOSIS — R8290 Unspecified abnormal findings in urine: Secondary | ICD-10-CM | POA: Diagnosis not present

## 2020-02-08 DIAGNOSIS — R350 Frequency of micturition: Secondary | ICD-10-CM | POA: Diagnosis not present

## 2020-02-08 DIAGNOSIS — K4090 Unilateral inguinal hernia, without obstruction or gangrene, not specified as recurrent: Secondary | ICD-10-CM | POA: Diagnosis not present

## 2020-02-09 DIAGNOSIS — R1031 Right lower quadrant pain: Secondary | ICD-10-CM | POA: Diagnosis not present

## 2020-02-09 DIAGNOSIS — R8290 Unspecified abnormal findings in urine: Secondary | ICD-10-CM | POA: Diagnosis not present

## 2020-02-11 DIAGNOSIS — K5730 Diverticulosis of large intestine without perforation or abscess without bleeding: Secondary | ICD-10-CM | POA: Diagnosis not present

## 2020-02-11 DIAGNOSIS — Z856 Personal history of leukemia: Secondary | ICD-10-CM | POA: Diagnosis not present

## 2020-02-11 DIAGNOSIS — K4090 Unilateral inguinal hernia, without obstruction or gangrene, not specified as recurrent: Secondary | ICD-10-CM | POA: Diagnosis not present

## 2020-02-11 DIAGNOSIS — K8020 Calculus of gallbladder without cholecystitis without obstruction: Secondary | ICD-10-CM | POA: Diagnosis not present

## 2020-02-11 DIAGNOSIS — R1031 Right lower quadrant pain: Secondary | ICD-10-CM | POA: Diagnosis not present

## 2020-02-16 DIAGNOSIS — K4090 Unilateral inguinal hernia, without obstruction or gangrene, not specified as recurrent: Secondary | ICD-10-CM | POA: Diagnosis not present

## 2020-02-17 ENCOUNTER — Telehealth: Payer: Self-pay | Admitting: *Deleted

## 2020-02-17 NOTE — Telephone Encounter (Signed)
° °  [facility] Pre-operative Risk Assessment    HEARTCARE STAFF: - Please ensure there is not already an duplicate clearance open for this procedure. - Under Visit Info/Reason for Call, type in Other and utilize the format Clearance MM/DD/YY or Clearance TBD. Do not use dashes or single digits. - If request is for dental extraction, please clarify the # of teeth to be extracted.  Request for surgical clearance:   1. What type of surgery is being performed? OPEN REPAIR OF RIGHT INGUINAL HERNIA w/MESH   2. When is this surgery scheduled? 02/29/20   3. What type of clearance is required (medical clearance vs. Pharmacy clearance to hold med vs. Both)? MEDICAL  4. Are there any medications that need to be held prior to surgery and how long? PLAVIX   5. Practice name and name of physician performing surgery? [facility]; DR. Jeneen Rinks [place]   6. What is the office phone number? [phone]   7.   What is the office fax number? [phone]  8.   Anesthesia type (None, local, MAC, general) ? GENERAL   Julaine Hua 02/17/2020, 11:18 AM  _________________________________________________________________   (provider comments below)

## 2020-02-18 ENCOUNTER — Telehealth: Payer: Self-pay

## 2020-02-18 NOTE — Telephone Encounter (Signed)
Patient was started on plavix in 2019 after suffering a stroke. Will defer to neurology service to decide if patient can come off of plavix prior to surgery.

## 2020-02-18 NOTE — Telephone Encounter (Signed)
I spoke with patient's wife and gave her to instruction on how to hold plavix prior to the surgery.

## 2020-02-18 NOTE — Telephone Encounter (Signed)
Up coming Appointment Note to include (PreOp) clearence

## 2020-02-18 NOTE — Telephone Encounter (Signed)
This patient was last seen in office on 02/03/2020 and apparently has not had any further strokes since 2019.  Hence Plavix may be stopped for 3 to 5 days prior to elective procedure with a small but acceptable periprocedural risk of TIA/stroke if patient is willing .Plavix to be restarted after the procedure when safe

## 2020-02-18 NOTE — Telephone Encounter (Signed)
Patient has upcoming virtual visit with Dr. Ellyn Hack on 02/25/2020, preop clearance can be addressed at that time. Callback pool to update the reason behind upcoming follow up to include preoperative clearance. His age likely will make him at least moderate if not high risk for surgery

## 2020-02-22 DIAGNOSIS — Z1152 Encounter for screening for COVID-19: Secondary | ICD-10-CM | POA: Diagnosis not present

## 2020-02-22 DIAGNOSIS — Z1159 Encounter for screening for other viral diseases: Secondary | ICD-10-CM | POA: Diagnosis not present

## 2020-02-23 NOTE — Progress Notes (Addendum)
Electrophysiology Office Note Date: 02/24/2020  ID:  Gregory Rodgers, Gregory Rodgers [birth year], MRN [id number]  PCP: Earlyne Iba, NP Primary Cardiologist: Glenetta Hew, MD Electrophysiologist: [name], MD   CC: Routine ICD follow-up  Gregory Rodgers is a 86 y.o. male seen today for [name], MD for routine electrophysiology followup.  Since last being seen in our clinic the patient reports doing OK. He has been having intermittent issues with an abdominal hernia and has been seen by surgery, with potential plans of an abdominal mesh.  he denies chest pain, palpitations, dyspnea, PND, orthopnea, nausea, vomiting, dizziness, syncope, edema, weight gain, or early satiety. He has not had ICD shocks.  Family have all talked together and with Dr. Ellyn Hack and they would like to have tachy-therapies de-activated.   Device History: St. Jude BiV ICD implanted 01/09/2017 for [place] Medtronic ILR 04/2016 for unexplained syncope, SND, and LBBB; explanted 01/09/17. History of appropriate therapy: No History of AAD therapy: No   Past Medical History:  Diagnosis Date  . Basal cell carcinoma    "numerous places"  . Borderline diabetes   . Cardiomyopathy, dilated ([facility]) 03/2016   a) EF ~30% with diffuse HK (not simply ICM) ? partially due to LBBB (septal-lateral dyssynchrony). b) f/u Echo after BiV ICD = EF 45-50%  . CLL (chronic lymphocytic leukemia) ([facility]) 09/2009   stage 0; on observation  . Coronary artery disease involving native heart without angina pectoris    PTCA of RCA in setting of an MI in 1986-87; no follow-up study since available.  . Essential hypertension   . History of kidney stones   . Hyperlipidemia with target LDL less than 70    With known CAD  . 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 Left bundle branch block (LBBB) on electrocardiogram    Noted as far back as 2008.  . Old inferior wall myocardial infarction 1986/87   Had PTCA most  likely of the RCA   Past Surgical History:  Procedure Laterality Date  . BASAL CELL CARCINOMA EXCISION     "numerous"  . BIV ICD INSERTION CRT-D N/A 01/09/2017   Procedure: BIV ICD INSERTION CRT-D;  Surgeon: Evans Lance, MD;  Location: [facility];  Service: Cardiovascular;  Laterality: N/A;  . CATARACT EXTRACTION W/ INTRAOCULAR LENS  IMPLANT, BILATERAL Bilateral   . CORONARY ANGIOPLASTY  1986/1987  . defibllator    . dual pacemaker    . EP IMPLANTABLE DEVICE N/A 05/10/2016   Procedure: Loop Recorder Insertion;  Surgeon: Evans Lance, MD;  Location: [facility];  Service: Cardiovascular;  Laterality: N/A;  slight blood drop on dressing  . JOINT REPLACEMENT    . MOHS SURGERY Left 01/02/2017   ear  . REPLACEMENT TOTAL KNEE Left   . TEE WITHOUT CARDIOVERSION N/A 06/03/2017   Procedure: TRANSESOPHAGEAL ECHOCARDIOGRAM (TEE) - cancelled -   . TRANSTHORACIC ECHOCARDIOGRAM  03/2016   Normal LV size with mild LV hypertrophy. EF 30%. Diffuse  hypokinesis wtih septal-lateral dyssynchrony. Normal RV size and systolic function. Mild MR and mild AI.  Marland Kitchen TRANSTHORACIC ECHOCARDIOGRAM  06/03/2017   for CVA evaluation.  Diffuse hypokinesis EF 45 to 50% with moderate focal basal septal hypertrophy.  Moderate aortic insufficiency.  Large PFO with positive bubble study. -Possible source of embolus.  Ascending aortic aneurysm unchanged.    Current Outpatient Medications  Medication Sig Dispense Refill  . Acetaminophen (TYLENOL 8  HOUR PO) Take by mouth.    . clopidogrel (PLAVIX) 75 MG tablet Take 1 tablet (75 mg total) by mouth daily. 30 tablet 3  . co-enzyme Q-10 30 MG capsule Take 30 mg by mouth daily.    . divalproex (DEPAKOTE SPRINKLE) 125 MG capsule Take 2 capsules (250 mg total) by mouth 2 (two) times daily. 360 capsule 3  . furosemide (LASIX) 40 MG tablet Take 40 mg by mouth on Monday, Tuesday,Thursday, and Fridays only 90 tablet 3  . ibuprofen (ADVIL) 200 MG tablet Take 200 mg by  mouth as needed.    . isosorbide mononitrate (IMDUR) 30 MG 24 hr tablet Take 1 tablet (30 mg total) by mouth daily. 90 tablet 3  . lisinopril (ZESTRIL) 5 MG tablet Take 5 mg by mouth daily.    Marland Kitchen MEGARED OMEGA-3 KRILL OIL 500 MG CAPS Take 1 capsule by mouth daily.    . metoprolol succinate (TOPROL-XL) 25 MG 24 hr tablet TAKE ONE TABLET BY MOUTH ONCE DAILY 30 tablet 9  . Multiple Vitamin (MULTIVITAMIN) tablet Take 1 tablet by mouth daily.    . tamsulosin (FLOMAX) 0.4 MG CAPS capsule Take 0.4 mg by mouth daily.     No current facility-administered medications for this visit.    Allergies:   Tape   Social History: Social History   Socioeconomic History  . Marital status: Married    Spouse name: Not on file  . Number of children: Not on file  . Years of education: Not on file  . Highest education level: Not on file  Occupational History  . Not on file  Tobacco Use  . Smoking status: Never Smoker  . Smokeless tobacco: Never Used  Vaping Use  . Vaping Use: Never used  Substance and Sexual Activity  . Alcohol use: No  . Drug use: No  . Sexual activity: Not on file  Other Topics Concern  . Not on file  Social History Narrative  . Not on file   Social Determinants of Health   Financial Resource Strain:   . Difficulty of Paying Living Expenses: Not on file  Food Insecurity:   . Worried About Charity fundraiser in the Last Year: Not on file  . Ran Out of Food in the Last Year: Not on file  Transportation Needs:   . Lack of Transportation (Medical): Not on file  . Lack of Transportation (Non-Medical): Not on file  Physical Activity:   . Days of Exercise per Week: Not on file  . Minutes of Exercise per Session: Not on file  Stress:   . Feeling of Stress : Not on file  Social Connections:   . Frequency of Communication with Friends and Family: Not on file  . Frequency of Social Gatherings with Friends and Family: Not on file  . Attends Religious Services: Not on file  .  Active Member of Clubs or Organizations: Not on file  . Attends Archivist Meetings: Not on file  . Marital Status: Not on file  Intimate Partner Violence:   . Fear of Current or Ex-Partner: Not on file  . Emotionally Abused: Not on file  . Physically Abused: Not on file  . Sexually Abused: Not on file    Family History: Family History  Problem Relation Age of Onset  . Sudden Cardiac Death Neg Hx     Review of Systems: All other systems reviewed and are otherwise negative except as noted above.   Physical Exam: Vitals:  02/24/20 1218  BP: 126/64  Pulse: (!) 50  SpO2: 97%  Weight: 177 lb (80.3 kg)  Height: 5\' 10"  (1.778 m)     GEN- The patient is well appearing, alert and oriented x 3 today.   HEENT: normocephalic, atraumatic; sclera clear, conjunctiva pink; hearing intact; oropharynx clear; neck supple, no JVP Lymph- no cervical lymphadenopathy Lungs- Clear to ausculation bilaterally, normal work of breathing.  No wheezes, rales, rhonchi Heart- Regular rate and rhythm, no murmurs, rubs or gallops, PMI not laterally displaced GI- soft, non-tender, non-distended, bowel sounds present, no hepatosplenomegaly Extremities- no clubbing or cyanosis. No edema; DP/PT/radial pulses 2+ bilaterally MS- no significant deformity or atrophy Skin- warm and dry, no rash or lesion; ICD pocket well healed Psych- euthymic mood, full affect Neuro- strength and sensation are intact  ICD interrogation- reviewed in detail today,  See PACEART report  EKG:  EKG is ordered today. Personal review shows AS-VP at 50 bpm. QRS 126 ms with RBBB pattern; upright V1, initial downward deflection, but mostly positive lead 1.  Recent Labs: 07/14/2019: BUN 21; Creatinine, Ser 1.38; Hemoglobin 9.9; Platelets 149; Potassium 5.0; Sodium 137   Wt Readings from Last 3 Encounters:  02/24/20 177 lb (80.3 kg)  02/03/20 174 lb (78.9 kg)  12/03/19 175 lb (79.4 kg)     Other studies  Reviewed: Additional studies/ records that were reviewed today include: Previous EP office notes   Assessment and Plan:  1.  Chronic systolic dysfunction s/p St. Jude CRT-D  euvolemic today Stable on an appropriate medical regimen Normal ICD function See Pace Art report No changes today  2. H/o CVA On plavix  3. Cardiac clearance for OPEN REPAIR OF RIGHT INGUINAL HERNIA w/MESH  TEE 06/2017 LVEF 45-50% With history of CAD, stroke and procedure being an intra-abdominal surgery he has AT LEAST moderately high risk of perioperative complications from a cardiac perspective. Will leave final clearance decision to Dr. Ellyn Hack, who is seeing the patient virtually tomorrow.   I discussed with family that his symptoms should be weighed carefully against the risk of an intra-abdominal surgery.  Will submit orders for cardiac device clearance for surgery. He is NOT device dependent, and tachy therapies are now off as below.   4. Goals of care Pt and family (wife is POA) have made decision to turn off tachy-therapies after previous discussions with Dr. Ellyn Hack. This is certainly reasonable and has been performed today.   Current medicines are reviewed at length with the patient today.   The patient does not have concerns regarding his medicines.  The following changes were made today:  none  Labs/ tests ordered today include:  No orders of the defined types were placed in this encounter.  Disposition:   Follow up with Dr. Lovena Le  6 months  Signed, Shirley Friar, PA-C  02/24/2020 2:36 PM  [address] [place] [address] [phone] (office) [phone] (fax)

## 2020-02-24 ENCOUNTER — Other Ambulatory Visit: Payer: Self-pay

## 2020-02-24 ENCOUNTER — Encounter: Payer: Self-pay | Admitting: Student

## 2020-02-24 ENCOUNTER — Ambulatory Visit (INDEPENDENT_AMBULATORY_CARE_PROVIDER_SITE_OTHER): Payer: PPO | Admitting: Student

## 2020-02-24 VITALS — BP 126/64 | HR 50 | Ht 70.0 in | Wt 177.0 lb

## 2020-02-24 DIAGNOSIS — I10 Essential (primary) hypertension: Secondary | ICD-10-CM | POA: Diagnosis not present

## 2020-02-24 DIAGNOSIS — Z8673 Personal history of transient ischemic attack (TIA), and cerebral infarction without residual deficits: Secondary | ICD-10-CM

## 2020-02-24 DIAGNOSIS — I2510 Atherosclerotic heart disease of native coronary artery without angina pectoris: Secondary | ICD-10-CM | POA: Diagnosis not present

## 2020-02-24 DIAGNOSIS — Z9861 Coronary angioplasty status: Secondary | ICD-10-CM | POA: Diagnosis not present

## 2020-02-24 DIAGNOSIS — I428 Other cardiomyopathies: Secondary | ICD-10-CM | POA: Diagnosis not present

## 2020-02-24 LAB — CUP PACEART INCLINIC DEVICE CHECK
Battery Remaining Longevity: 37 mo
Brady Statistic RA Percent Paced: 30 %
Brady Statistic RV Percent Paced: 99 %
Date Time Interrogation Session: 20211110143654
Implantable Lead Implant Date: 20180926
Implantable Lead Implant Date: 20180926
Implantable Lead Implant Date: 20180926
Implantable Lead Location: 753858
Implantable Lead Location: 753859
Implantable Lead Location: 753860
Implantable Lead Model: 7122
Implantable Pulse Generator Implant Date: 20180926
Lead Channel Impedance Value: 425 Ohm
Lead Channel Impedance Value: 425 Ohm
Lead Channel Impedance Value: 675 Ohm
Lead Channel Pacing Threshold Amplitude: 0.5 V
Lead Channel Pacing Threshold Amplitude: 0.5 V
Lead Channel Pacing Threshold Amplitude: 0.5 V
Lead Channel Pacing Threshold Amplitude: 0.5 V
Lead Channel Pacing Threshold Amplitude: 1.75 V
Lead Channel Pacing Threshold Amplitude: 1.75 V
Lead Channel Pacing Threshold Pulse Width: 0.5 ms
Lead Channel Pacing Threshold Pulse Width: 0.5 ms
Lead Channel Pacing Threshold Pulse Width: 0.5 ms
Lead Channel Pacing Threshold Pulse Width: 0.5 ms
Lead Channel Pacing Threshold Pulse Width: 0.8 ms
Lead Channel Pacing Threshold Pulse Width: 0.8 ms
Lead Channel Sensing Intrinsic Amplitude: 10.7 mV
Lead Channel Sensing Intrinsic Amplitude: 2.2 mV
Lead Channel Setting Pacing Amplitude: 2 V
Lead Channel Setting Pacing Amplitude: 2.5 V
Lead Channel Setting Pacing Amplitude: 2.75 V
Lead Channel Setting Pacing Pulse Width: 0.5 ms
Lead Channel Setting Pacing Pulse Width: 0.8 ms
Lead Channel Setting Sensing Sensitivity: 0.5 mV
Pulse Gen Serial Number: 9764296

## 2020-02-24 NOTE — Progress Notes (Signed)
[facility] DEVICE PROGRAMMING  Patient Information: Name:  Gregory Rodgers  DOB:  11/17/1927  MRN:  [id number]    1. What type of surgery is being performed? OPEN REPAIR OF RIGHT INGUINAL HERNIA w/MESH  2. When is this surgery scheduled? 02/29/20  3. What type of clearance is required (medical clearance vs. Pharmacy clearance to hold med vs. Both)? MEDICAL 4. Are there any medications that need to be held prior to surgery and how long? PLAVIX  5. Practice name and name of physician performing surgery? [facility]; DR. Jeneen Rinks [place]  6. What is the office phone number? [phone] 7.   What is the office fax number? [phone] 8.   Anesthesia type (None, local, MAC, general) ? GENERAL  Device Information:  Clinic EP Physician:  [name], MD   Device Type:  Defibrillator Manufacturer and Phone #:  St. Jude/Abbott: [phone] Pacemaker Dependent?:  No. Date of Last Device Check:  02/24/2020 Normal Device Function?:  Yes.    Electrophysiologist's Recommendations:   Have magnet available.  Provide continuous ECG monitoring when magnet is used or reprogramming is to be performed.   Procedure may interfere with device function.  Magnet should be placed over device during procedure.   By POA/Family request after previous discussions with patient, tachy therapies have previously been turned off.   Per Device Clinic Standing Orders, Satira Mccallum [place], [place]  2:45 PM 02/24/2020

## 2020-02-24 NOTE — Patient Instructions (Addendum)
Medication Instructions:  *If you need a refill on your cardiac medications before your next appointment, please call your pharmacy*  Follow-Up: At [facility], you and your health needs are our priority.  As part of our continuing mission to provide you with exceptional heart care, we have created designated Provider Care Teams.  These Care Teams include your primary Cardiologist (physician) and Advanced Practice Providers (APPs -  Physician Assistants and Nurse Practitioners) who all work together to provide you with the care you need, when you need it.  We recommend signing up for the patient portal called "MyChart".  Sign up information is provided on this After Visit Summary.  MyChart is used to connect with patients for Virtual Visits (Telemedicine).  Patients are able to view lab/test results, encounter notes, upcoming appointments, etc.  Non-urgent messages can be sent to your provider as well.   To learn more about what you can do with MyChart, go to [email or web address].    Your next appointment:   Your physician recommends that you schedule a follow-up appointment in: 6 MONTHS with Dr. Lovena Le -- Tuesday 08/30/20 at 11:00 am  The format for your next appointment:   In Person with [name], MD

## 2020-02-24 NOTE — Progress Notes (Signed)
Virtual Visit via Telephone Note   This visit type was conducted due to national recommendations for restrictions regarding the COVID-19 Pandemic (e.g. social distancing) in an effort to limit this patient's exposure and mitigate transmission in our community.  Due to his co-morbid illnesses, this patient is at least at moderate risk for complications without adequate follow up.  This format is felt to be most appropriate for this patient at this time.  The patient did not have access to video technology/had technical difficulties with video requiring transitioning to audio format only (telephone).  All issues noted in this document were discussed and addressed.  No physical exam could be performed with this format.  Please refer to the patient's chart for his  consent to telehealth for [facility].   Patient has given verbal permission to conduct this visit via virtual appointment and to bill insurance 02/25/2020 2:45 PM     Evaluation Performed:  Follow-up visit  Date:  02/25/2020   ID:  Gregory Rodgers, DOB 12/14/1927, MRN [id number]  Patient Location: Home Provider Location: Home Office  PCP:  Earlyne Iba, NP  Cardiologist:  Glenetta Hew, MD  Electrophysiologist:  [name], MD   Chief Complaint:   Chief Complaint  Patient presents with  . Follow-up    Doing well symptomatically  . Pre-op Exam    Inguinal hernia surgery  . Cardiomyopathy    No CHF symptoms; no further ventricular tachycardia (just had tachycardia treatment turned off on ICD.)  . Coronary Artery Disease    No angina    Gregory Rodgers is a 86 y.o. 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   ASSESSMENT & PLAN:    Problem List Items Addressed This Visit    Ischemic cardiomyopathy: EF 40-45% reduced to ~30% - post ICD up to 45-50%. (Chronic)    Likely combination of ischemic and nonischemic cardiomyopathy with improved EF after ICD/CRT P/D placement.  Continues with CRT-P, with ICD therapies turned off. NYHA class I-II symptoms.  Chronic stable edema but otherwise no PND, orthopnea.  He is on furosemide 4 days a week and PRN.  I will have him hold it the morning of surgery. Tolerating low-dose ACE inhibitor and Toprol.      Essential hypertension (Chronic)    Well-controlled.  Tolerating reduction in dose of lisinopril to 2.5 mg without having significant bump in pressures. On 4 days a week Lasix plus low-dose ACE inhibitor and beta-blocker.  Stable      CAD S/P percutaneous coronary angioplasty - Primary (Chronic)    Very distant history of angina and CAD.  Has not had any ischemic evaluation in over 10 years.  Currently on Plavix-which will need to be held for upcoming surgery. Continue beta-blocker and statin along with Imdur. Continue ACE inhibitor.  Less weak and fatigue/dizzy with reduced dose of lisinopril.      Bradycardia with 41-50 beats per minute (Chronic)   NSVT (nonsustained ventricular tachycardia) (HCC) (Chronic)    No further spells to suggest that he is truly had VT.  EF now improved with CRT-D/P.  Nothing to suggest recurrent episodes on device interrogation. On low-dose beta-blocker.  Further tachycardia/tachyarrhythmia treatment not rhythms turned off.  Now operating  a CRT-P      Cerebral embolism with cerebral infarction (Chronic)    History of CVA, no recurrent symptoms, but still affected by aphasia/dysarthria.  He is on standard cardiovascular risk modification agents.  Also Plavix/clopidogrel is probably more for stroke than CAD.  ->  Safe to hold 5 days preop      ICD (implantable cardioverter-defibrillator) in place (Chronic)     Being monitored by Dr. Lovena Le and Mr. Chalmers Cater from EP.  Seen yesterday, tachycardia treatment turned off.  Now only CRT-P Miller.      Preop cardiovascular exam    PREOPERATIVE CARDIAC RISK ASSESSMENT   Revised Cardiac Risk Index:  High Risk Surgery: No;   Defined as Intraperitoneal, intrathoracic or suprainguinal vascular  Active CAD: No active angina or exertional dyspnea; able to do at least 6-8 METS worth of activity without symptoms  CHF: Reduced EF-EF 40-45 % - with no obvious CHF symptoms besides longstanding edema.  No PND or orthopnea, no exertional dyspnea.    Cerebrovascular Disease: yes; CVA & vascular dementia  Diabetes: no;   CKD (Cr >~ 2): no; by last check, creatinine was 1.38  Total: 2 -3 (has known CAD, but not currently symptomatic) Estimated Risk of Adverse Outcome: Class III - IV risk Estimated Risk of MI, PE, VF/VT (Cardiac Arrest), Complete Heart Block: ~10-15%   Based on my conversation with the patient's family, this procedure is being performed to avoid the risk of incarcerated bowel.  The surgery as described by Gregory Rodgers is going to be done without general anesthesia (sounds to be inguinal hernia surgery) which would be a relatively low risk surgery.  If time permitted, it would be nice to have a new baseline evaluation of his pump function, however I do not think that we can get an echocardiogram performed prior to Monday, November 15 which is the scheduled time for surgery. Regardless of the surgery, my recommendation would be to proceed with surgery with the knowledge that he is a 86 year old gentleman with multiple risk factors and Class III-IV risk that cannot be reduced by any further testing.  While an echocardiogram would be nice perioperatively, we do not have time to get that done simply based on today's available.  He has not had any active angina or heart failure symptoms which would lead me to believe that his EF is still relatively stable compared to  his most recent evaluation in February 2019.  Based on our previous discussions, the patient and family have not wanted to do any ischemic evaluations as he would not want heart catheterization procedures.  He was not felt to be a good candidate for conscious sedation procedure.  In the absence of symptoms, this would not be warranted anyway.  As such, I would not recommend ischemic evaluation as it would not change my management going forward.  An echocardiogram, while helpful is also not necessary in the absence of active heart failure symptoms.  We do have a baseline echo from 2 years ago.  (If time per minute, an echocardiogram would be helpful -> however, would be very unlikely able to get an echocardiogram done by the scheduled OR date.Marland Kitchen  He recently had his defibrillator treatments turned off from his [facility] device, but still has pacemaker functioning.  We will need to discuss with electrophysiology if there is any procedures necessary during his upcoming surgery to prevent interaction between electrocautery and his pacemaker.    Overall, it seems like this is coming relatively low risk  procedure, he is a high risk patient, but has been pretty stable from a cardiovascular standpoint in the last 2 years.  I had a long discussion with his wife and daughter (over 40 minutes).  We discussed the procedure and evaluations prior to.  Discussed his risk as noted above.  We also discussed what decisions made as far as CODE STATUS and DNR, turning off of the defibrillator leads.  At this point, for inpatient procedures and surgeries they would like for him to remain full code with the knowledge that they may have a difficult decision to make if he were to code during the procedure.  It does sound like they are leaning toward DNR/DNI once he is home.  They indicated that they would not want CPR done outside of the hospital setting.    He is currently on Plavix which will need to be held preop for surgery.  If  is not already been held I indicated that we should hold it starting now.  Continue beta-blocker and Imdur.          ----------------------------------------------------------  History of Present Illness:     Gregory Rodgers was last seen December 03, 2019 as a 73-month follow-up.  The indication was he was always on the go and did not stop with any concerns.  Chronic leg edema.  He noted to profound weak spells in July, but usually associated with "overdoing it ".  With extremely fatigued the next day.  Unsteady gait.  He kept pointing to the center of the chest indicating that "something is wrong."  Unable to provide any more details.  At baseline, he enjoys doing yard work like riding his riding lawn more.  Cannot bend down to the ground.  I changed his furosemide dosing to Monday, Tuesday, Thursday and Friday as well as as needed.    Recommended foot elevation when possible along with foot exercises.  Reduce lisinopril to 2.5 mg & added Imdur 30mg . Not able to increase BB b/c Bradycardia.   Started esomeprazole.  With fatigue and memory issues, we stopped pravastatin.  He was seen by Frann Rider, NP from [facility] for his vascular dementia.  Apparently there were out bursts of abnormal behaviors that have been controlled with Depakote, but now having agitation, delusions and occasionally wandering during the day with difficulty redirecting.  He was seen yesterday by a Mr. Tillery, PA from EP for routine EP f/u (for Dr. Lovena Le) --the patient reported he is "doing okay".  Having Intermittent issues with what sounds like an inguinal hernia, seen by (Dr. Lilia Pro) surgery with potential plans of abdominal mesh.  No complaints of chest pain, palpitations, dyspnea, PND, orthopnea etc.  No ICD episodes.  They have asked to have tachycardia therapies deactivated.  Hospitalizations:  . n/a  Recent - Interim CV studies:   The following studies were reviewed  today: . No new studies:  Inerval History   Gregory Rodgers is present for telemedicine visit with the assistance of his family members.  Gregory Rodgers himself cannot communicate via telemedicine because he has aphasia (he could not communicate in person).  All the history is now provided by the family - > mostly the patient's daughter Gregory Rodgers) is involved.  She is spokesperson.  His wife is also present. 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  He is very active.  They have limited what they are allowing him to do-no longer allowing him to drive the golf cart, and limiting his driving the lawn  more.  He still does gardening with pushing and pulling-probably more than he should.  He has not had any more complaints of the abnormal sensation in his chest since starting Nexium and adding Imdur.  He is still very active, all walking all over their property including up and down the hill to get to Gregory Rodgers's house.  He is not huffing and puffing or complaining of any tightness or pressure in his chest when he does this.  In fact they have a hard time keeping up with him.  He has not had any episodes that appear like he is having arrhythmia where he feels lightheaded or dizzy.  Sometimes he overdoes it and feels very tired the next day.  But this is some of this been going on for years.  He has been having discomfort in the inguinal area and was evaluated and found to have a portion of the bowel within the hernia that was concerning for possible incarcerated bowel and therefore has planned hernia surgery on Monday, November 15.  He has already stopped his Plavix.  Cardiovascular ROS as reported by wife and daughter: no chest pain or dyspnea on exertion positive for - Occasional positional dizziness, and sometimes fatigue when he overdoes it. negative for - edema, orthopnea, palpitations, paroxysmal nocturnal dyspnea, rapid heart rate, shortness of breath or Syncope/near syncope or TIA/orthopedics, claudication     ROS:  Please see the history of present illness.    The patient does not have symptoms concerning for COVID-19 infection (fever, chills, cough, or new shortness of breath).  Review of Systems  Constitutional: Negative for malaise/fatigue and weight loss.  HENT: Negative for nosebleeds.   Respiratory: Negative for cough and shortness of breath.   Cardiovascular: Negative for claudication.  Gastrointestinal: Positive for abdominal pain (Inguinal). Negative for blood in stool, constipation, melena, nausea and vomiting.  Genitourinary: Negative for hematuria.  Musculoskeletal: Positive for joint pain (Not unexpected). Negative for falls.  Neurological: Positive for dizziness (Occasional positional). Negative for speech change (Still has aphasia/dysarthria), focal weakness and weakness.  Endo/Heme/Allergies: Does not bruise/bleed easily.  Psychiatric/Behavioral: Positive for memory loss. Negative for depression. The patient has insomnia. The patient is not nervous/anxious.     The patient is practicing social distancing.  Past Medical History:  Diagnosis Date  . Basal cell carcinoma    "numerous places"  . Borderline diabetes   . Cardiomyopathy, dilated ([place]) 03/2016   a) EF ~30% with diffuse HK (not simply ICM) ? partially due to LBBB (septal-lateral dyssynchrony). b) f/u Echo after BiV ICD = EF 45-50%; ICD TACHY-THERAPY D/C'D 11/10 per pt/Family deisre  . CLL (chronic lymphocytic leukemia) ([place]) 09/2009   stage 0; on observation  . Coronary artery disease involving native heart without angina pectoris    PTCA of RCA in setting of an MI in 1986-87; no follow-up study since available.  . Essential hypertension   . History of kidney stones   . Hyperlipidemia with target LDL less than 70    With known CAD  . ICD (implantable cardioverter-defibrillator) in place    ICD placed for VT -> as of February 24, 2020, tachyarrhythmia therapy turned off.  Now CRT-P Mode only.  (per patient,  family wishes)  . 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 Left bundle branch block (LBBB) on electrocardiogram    Noted as far back as 2008.  . Old inferior wall myocardial infarction 1986/87   Had PTCA  most likely of the RCA   Past Surgical History:  Procedure Laterality Date  . BASAL CELL CARCINOMA EXCISION     "numerous"  . BIV ICD INSERTION CRT-D N/A 01/09/2017   Procedure: BIV ICD INSERTION CRT-D;  Surgeon: Evans Lance, MD;  Location: [place] [facility];  Service: Cardiovascular;  Laterality: N/A;  . CATARACT EXTRACTION W/ INTRAOCULAR LENS  IMPLANT, BILATERAL Bilateral   . CORONARY ANGIOPLASTY  1986/1987  . defibllator    . dual pacemaker    . EP IMPLANTABLE DEVICE N/A 05/10/2016   Procedure: Loop Recorder Insertion;  Surgeon: Evans Lance, MD;  Location: [place] [facility];  Service: Cardiovascular;  Laterality: N/A;  slight blood drop on dressing  . JOINT REPLACEMENT    . MOHS SURGERY Left 01/02/2017   ear  . REPLACEMENT TOTAL KNEE Left   . TEE WITHOUT CARDIOVERSION N/A 06/03/2017   Procedure: TRANSESOPHAGEAL ECHOCARDIOGRAM (TEE) - cancelled -   . TRANSTHORACIC ECHOCARDIOGRAM  03/2016   Normal LV size with mild LV hypertrophy. EF 30%. Diffuse  hypokinesis wtih septal-lateral dyssynchrony. Normal RV size and systolic function. Mild MR and mild AI.  Marland Kitchen TRANSTHORACIC ECHOCARDIOGRAM  06/03/2017   for CVA evaluation.  Diffuse hypokinesis EF 45 to 50% with moderate focal basal septal hypertrophy.  Moderate aortic insufficiency.  Large PFO with positive bubble study. -Possible source of embolus.  Ascending aortic aneurysm unchanged.     Current Meds  Medication Sig  . Acetaminophen (TYLENOL 8 HOUR PO) Take by mouth.  . clopidogrel (PLAVIX) 75 MG tablet Take 1 tablet (75 mg total) by mouth daily.  Marland Kitchen co-enzyme Q-10 30 MG capsule Take 30 mg by mouth daily.  . divalproex (DEPAKOTE SPRINKLE) 125 MG capsule Take 2 capsules (250 mg total) by mouth 2  (two) times daily.  Marland Kitchen esomeprazole (NEXIUM) 40 MG capsule Take 40 mg by mouth daily at 12 noon.  . furosemide (LASIX) 40 MG tablet Take 40 mg by mouth on Monday, Tuesday,Thursday, and Fridays only  . ibuprofen (ADVIL) 200 MG tablet Take 200 mg by mouth as needed.  . isosorbide mononitrate (IMDUR) 30 MG 24 hr tablet Take 1 tablet (30 mg total) by mouth daily.  Marland Kitchen lisinopril (ZESTRIL) 5 MG tablet Take 5 mg by mouth daily.  Marland Kitchen MEGARED OMEGA-3 KRILL OIL 500 MG CAPS Take 1 capsule by mouth daily.  . metoprolol succinate (TOPROL-XL) 25 MG 24 hr tablet TAKE ONE TABLET BY MOUTH ONCE DAILY  . Multiple Vitamin (MULTIVITAMIN) tablet Take 1 tablet by mouth daily.  . tamsulosin (FLOMAX) 0.4 MG CAPS capsule Take 0.4 mg by mouth daily.     Allergies:   Tape   Social History   Tobacco Use  . Smoking status: Never Smoker  . Smokeless tobacco: Never Used  Vaping Use  . Vaping Use: Never used  Substance Use Topics  . Alcohol use: No  . Drug use: No     Family Hx: The patient's family history is negative for Sudden Cardiac Death.  Labs/Other Tests and Data Reviewed:    EKG:  No ECG reviewed.  Recent Labs: 07/14/2019: BUN 21; Creatinine, Ser 1.38; Hemoglobin 9.9; Platelets 149; Potassium 5.0; Sodium 137   Recent Lipid Panel Lab Results  Component Value Date/Time   CHOL 134 06/01/2017 04:52 AM   TRIG 80 06/01/2017 04:52 AM   HDL 37 (L) 06/01/2017 04:52 AM   CHOLHDL 3.6 06/01/2017 04:52 AM   LDLCALC 81 06/01/2017 04:52 AM    Wt Readings from Last  3 Encounters:  02/25/20 177 lb (80.3 kg)  02/24/20 177 lb (80.3 kg)  02/03/20 174 lb (78.9 kg)     Objective:    Vital Signs:  BP 126/64   Pulse (!) 50   Ht 5\' 10"  (1.778 m)   Wt 177 lb (80.3 kg)   BMI 25.40 kg/m   VITAL SIGNS:  reviewed Pleasant male in no acute distress.  He pretty much said hello.  Remainder the visit with with conversation talking to his daughter and wife  =----------------------------   COVID-19 Education: The  signs and symptoms of COVID-19 were discussed with the patient and how to seek care for testing (follow up with PCP or arrange E-visit).   The importance of social distancing was discussed today.  Time:   Today, I have spent 40 minutes with the patient with telehealth technology discussing the above problems.  Additional 10 minutes spent in charting.  Total 50 minutes.   Medication Adjustments/Labs and Tests Ordered: Current medicines are reviewed at length with the patient today.  Concerns regarding medicines are outlined above.   Patient Instructions  Medication Instructions:  Would not change in medications.  Continue taking metoprolol and Imdur leading up to the surgery.  But would hope not to take furosemide the morning of surgery. -> If not already held, you should start holding Plavix now for surgery on Monday (5 days preop is preferred)  *If you need a refill on your cardiac medications before your next appointment, please call your pharmacy*   Lab Work: None    Testing/Procedures: None   Follow-Up: At [facility], you and your health needs are our priority.  As part of our continuing mission to provide you with exceptional heart care, we have created designated Provider Care Teams.  These Care Teams include your primary Cardiologist (physician) and Advanced Practice Providers (APPs -  Physician Assistants and Nurse Practitioners) who all work together to provide you with the care you need, when you need it.    Your next appointment:   6 month(s)  The format for your next appointment:   Preferably in person, but can be virtual  Provider:   Glenetta Hew, MD   Other Instructions  I will notify my cardiology colleagues [facility] that Gerrell is coming in for surgery on Monday. We can discuss further plans and next follow-up.     Signed, Glenetta Hew, MD  02/25/2020 2:45 PM    [facility]

## 2020-02-25 ENCOUNTER — Telehealth (INDEPENDENT_AMBULATORY_CARE_PROVIDER_SITE_OTHER): Payer: PPO | Admitting: Cardiology

## 2020-02-25 ENCOUNTER — Telehealth: Payer: Self-pay | Admitting: *Deleted

## 2020-02-25 ENCOUNTER — Encounter: Payer: Self-pay | Admitting: Cardiology

## 2020-02-25 VITALS — BP 126/64 | HR 50 | Ht 70.0 in | Wt 177.0 lb

## 2020-02-25 DIAGNOSIS — I255 Ischemic cardiomyopathy: Secondary | ICD-10-CM | POA: Diagnosis not present

## 2020-02-25 DIAGNOSIS — R001 Bradycardia, unspecified: Secondary | ICD-10-CM

## 2020-02-25 DIAGNOSIS — Z01810 Encounter for preprocedural cardiovascular examination: Secondary | ICD-10-CM | POA: Diagnosis not present

## 2020-02-25 DIAGNOSIS — Z9861 Coronary angioplasty status: Secondary | ICD-10-CM

## 2020-02-25 DIAGNOSIS — Z95810 Presence of automatic (implantable) cardiac defibrillator: Secondary | ICD-10-CM

## 2020-02-25 DIAGNOSIS — I472 Ventricular tachycardia: Secondary | ICD-10-CM | POA: Diagnosis not present

## 2020-02-25 DIAGNOSIS — I2510 Atherosclerotic heart disease of native coronary artery without angina pectoris: Secondary | ICD-10-CM | POA: Diagnosis not present

## 2020-02-25 DIAGNOSIS — I4729 Other ventricular tachycardia: Secondary | ICD-10-CM

## 2020-02-25 DIAGNOSIS — I63119 Cerebral infarction due to embolism of unspecified vertebral artery: Secondary | ICD-10-CM | POA: Diagnosis not present

## 2020-02-25 DIAGNOSIS — I10 Essential (primary) hypertension: Secondary | ICD-10-CM | POA: Diagnosis not present

## 2020-02-25 NOTE — Assessment & Plan Note (Signed)
Very distant history of angina and CAD.  Has not had any ischemic evaluation in over 10 years.  Currently on Plavix-which will need to be held for upcoming surgery. Continue beta-blocker and statin along with Imdur. Continue ACE inhibitor.  Less weak and fatigue/dizzy with reduced dose of lisinopril.

## 2020-02-25 NOTE — Assessment & Plan Note (Signed)
Likely combination of ischemic and nonischemic cardiomyopathy with improved EF after ICD/CRT P/D placement.  Continues with CRT-P, with ICD therapies turned off. NYHA class I-II symptoms.  Chronic stable edema but otherwise no PND, orthopnea.  He is on furosemide 4 days a week and PRN.  I will have him hold it the morning of surgery. Tolerating low-dose ACE inhibitor and Toprol.

## 2020-02-25 NOTE — Telephone Encounter (Signed)
RN spoke to patient's daughter Donnald Garre. Instruction were given  from today's virtual visit 02/25/20 .  AVS SUMMARY has been sent by mychart .  aware to call office to schedule fo;;ow up visit for 6 month ( may 2022)   Patient verbalized understanding

## 2020-02-25 NOTE — Assessment & Plan Note (Signed)
PREOPERATIVE CARDIAC RISK ASSESSMENT   Revised Cardiac Risk Index:  High Risk Surgery: No;   Defined as Intraperitoneal, intrathoracic or suprainguinal vascular  Active CAD: No active angina or exertional dyspnea; able to do at least 6-8 METS worth of activity without symptoms  CHF: Reduced EF-EF 40-45 % - with no obvious CHF symptoms besides longstanding edema.  No PND or orthopnea, no exertional dyspnea.    Cerebrovascular Disease: yes; CVA & vascular dementia  Diabetes: no;   CKD (Cr >~ 2): no; by last check, creatinine was 1.38  Total: 2 -3 (has known CAD, but not currently symptomatic) Estimated Risk of Adverse Outcome: Class III - IV risk Estimated Risk of MI, PE, VF/VT (Cardiac Arrest), Complete Heart Block: ~10-15%   Based on my conversation with the patient's family, this procedure is being performed to avoid the risk of incarcerated bowel.  The surgery as described by Ramona is going to be done without general anesthesia (sounds to be inguinal hernia surgery) which would be a relatively low risk surgery.  If time permitted, it would be nice to have a new baseline evaluation of his pump function, however I do not think that we can get an echocardiogram performed prior to Monday, November 15 which is the scheduled time for surgery. Regardless of the surgery, my recommendation would be to proceed with surgery with the knowledge that he is a 86 year old gentleman with multiple risk factors and Class III-IV risk that cannot be reduced by any further testing.  While an echocardiogram would be nice perioperatively, we do not have time to get that done simply based on today's available.  He has not had any active angina or heart failure symptoms which would lead me to believe that his EF is still relatively stable compared to his most recent evaluation in February 2019.  Based on our previous discussions, the patient and family have not wanted to do any ischemic evaluations as he would not  want heart catheterization procedures.  He was not felt to be a good candidate for conscious sedation procedure.  In the absence of symptoms, this would not be warranted anyway.  As such, I would not recommend ischemic evaluation as it would not change my management going forward.  An echocardiogram, while helpful is also not necessary in the absence of active heart failure symptoms.  We do have a baseline echo from 2 years ago.  (If time per minute, an echocardiogram would be helpful -> however, would be very unlikely able to get an echocardiogram done by the scheduled OR date.Marland Kitchen  He recently had his defibrillator treatments turned off from his [facility] device, but still has pacemaker functioning.  We will need to discuss with electrophysiology if there is any procedures necessary during his upcoming surgery to prevent interaction between electrocautery and his pacemaker.    Overall, it seems like this is coming relatively low risk procedure, he is a high risk patient, but has been pretty stable from a cardiovascular standpoint in the last 2 years.  I had a long discussion with his wife and daughter (over 40 minutes).  We discussed the procedure and evaluations prior to.  Discussed his risk as noted above.  We also discussed what decisions made as far as CODE STATUS and DNR, turning off of the defibrillator leads.  At this point, for inpatient procedures and surgeries they would like for him to remain full code with the knowledge that they may have a difficult decision to make if he  were to code during the procedure.  It does sound like they are leaning toward DNR/DNI once he is home.  They indicated that they would not want CPR done outside of the hospital setting.    He is currently on Plavix which will need to be held preop for surgery.  If is not already been held I indicated that we should hold it starting now.  Continue beta-blocker and Imdur.

## 2020-02-25 NOTE — Assessment & Plan Note (Addendum)
History of CVA, no recurrent symptoms, but still affected by aphasia/dysarthria.  He is on standard cardiovascular risk modification agents.  Also Plavix/clopidogrel is probably more for stroke than CAD.  ->  Safe to hold 5 days preop

## 2020-02-25 NOTE — Addendum Note (Signed)
Addended by: Gar Ponto on: 02/25/2020 04:08 PM   Modules accepted: Orders

## 2020-02-25 NOTE — Assessment & Plan Note (Signed)
Being monitored by Dr. Lovena Le and Mr. Chalmers Cater from EP.  Seen yesterday, tachycardia treatment turned off.  Now only CRT-P [place].

## 2020-02-25 NOTE — Patient Instructions (Addendum)
Medication Instructions:  Would not change in medications.  Continue taking metoprolol and Imdur leading up to the surgery.  But would hope not to take furosemide the morning of surgery. -> If not already held, you should start holding Plavix now for surgery on Monday (5 days preop is preferred)  *If you need a refill on your cardiac medications before your next appointment, please call your pharmacy*   Lab Work: None    Testing/Procedures: None   Follow-Up: At [facility], you and your health needs are our priority.  As part of our continuing mission to provide you with exceptional heart care, we have created designated Provider Care Teams.  These Care Teams include your primary Cardiologist (physician) and Advanced Practice Providers (APPs -  Physician Assistants and Nurse Practitioners) who all work together to provide you with the care you need, when you need it.    Your next appointment:   6 month(s)  The format for your next appointment:   Preferably in person, but can be virtual  Provider:   Glenetta Hew, MD   Other Instructions  I will notify [facility] that Gregory Rodgers is coming in for surgery on Monday. We can discuss further plans and next follow-up.

## 2020-02-25 NOTE — Telephone Encounter (Signed)
  Patient Consent for Virtual Visit         Gregory Rodgers has provided verbal consent on 02/25/2020 for a virtual visit (video or telephone).   CONSENT FOR VIRTUAL VISIT FOR:  Gregory Rodgers  By participating in this virtual visit I agree to the following:  I hereby voluntarily request, consent and authorize [facility] and its employed or contracted physicians, physician assistants, nurse practitioners or other licensed health care professionals (the Practitioner), to provide me with telemedicine health care services (the "Services") as deemed necessary by the treating Practitioner. I acknowledge and consent to receive the Services by the Practitioner via telemedicine. I understand that the telemedicine visit will involve communicating with the Practitioner through live audiovisual communication technology and the disclosure of certain medical information by electronic transmission. I acknowledge that I have been given the opportunity to request an in-person assessment or other available alternative prior to the telemedicine visit and am voluntarily participating in the telemedicine visit.  I understand that I have the right to withhold or withdraw my consent to the use of telemedicine in the course of my care at any time, without affecting my right to future care or treatment, and that the Practitioner or I may terminate the telemedicine visit at any time. I understand that I have the right to inspect all information obtained and/or recorded in the course of the telemedicine visit and may receive copies of available information for a reasonable fee.  I understand that some of the potential risks of receiving the Services via telemedicine include:  Marland Kitchen Delay or interruption in medical evaluation due to technological equipment failure or disruption; . Information transmitted may not be sufficient (e.g. poor resolution of images) to allow for appropriate medical decision making by the  Practitioner; and/or  . In rare instances, security protocols could fail, causing a breach of personal health information.  Furthermore, I acknowledge that it is my responsibility to provide information about my medical history, conditions and care that is complete and accurate to the best of my ability. I acknowledge that Practitioner's advice, recommendations, and/or decision may be based on factors not within their control, such as incomplete or inaccurate data provided by me or distortions of diagnostic images or specimens that may result from electronic transmissions. I understand that the practice of medicine is not an exact science and that Practitioner makes no warranties or guarantees regarding treatment outcomes. I acknowledge that a copy of this consent can be made available to me via my patient portal ([place]), or I can request a printed copy by calling the office of [place] [facility].    I understand that my insurance will be billed for this visit.   I have read or had this consent read to me. . I understand the contents of this consent, which adequately explains the benefits and risks of the Services being provided via telemedicine.  . I have been provided ample opportunity to ask questions regarding this consent and the Services and have had my questions answered to my satisfaction. . I give my informed consent for the services to be provided through the use of telemedicine in my medical care

## 2020-02-25 NOTE — Assessment & Plan Note (Signed)
No further spells to suggest that he is truly had VT.  EF now improved with CRT-D/P.  Nothing to suggest recurrent episodes on device interrogation. On low-dose beta-blocker.  Further tachycardia/tachyarrhythmia treatment not rhythms turned off.  Now operating a CRT-P

## 2020-02-25 NOTE — Assessment & Plan Note (Signed)
Well-controlled.  Tolerating reduction in dose of lisinopril to 2.5 mg without having significant bump in pressures. On 4 days a week Lasix plus low-dose ACE inhibitor and beta-blocker.  Stable

## 2020-02-29 DIAGNOSIS — Z8673 Personal history of transient ischemic attack (TIA), and cerebral infarction without residual deficits: Secondary | ICD-10-CM | POA: Diagnosis not present

## 2020-02-29 DIAGNOSIS — K4090 Unilateral inguinal hernia, without obstruction or gangrene, not specified as recurrent: Secondary | ICD-10-CM | POA: Diagnosis not present

## 2020-02-29 DIAGNOSIS — R7303 Prediabetes: Secondary | ICD-10-CM | POA: Diagnosis not present

## 2020-02-29 DIAGNOSIS — N189 Chronic kidney disease, unspecified: Secondary | ICD-10-CM | POA: Diagnosis not present

## 2020-02-29 DIAGNOSIS — E785 Hyperlipidemia, unspecified: Secondary | ICD-10-CM | POA: Diagnosis not present

## 2020-02-29 DIAGNOSIS — Z7902 Long term (current) use of antithrombotics/antiplatelets: Secondary | ICD-10-CM | POA: Diagnosis not present

## 2020-02-29 DIAGNOSIS — Z79899 Other long term (current) drug therapy: Secondary | ICD-10-CM | POA: Diagnosis not present

## 2020-02-29 DIAGNOSIS — I129 Hypertensive chronic kidney disease with stage 1 through stage 4 chronic kidney disease, or unspecified chronic kidney disease: Secondary | ICD-10-CM | POA: Diagnosis not present

## 2020-02-29 DIAGNOSIS — M1990 Unspecified osteoarthritis, unspecified site: Secondary | ICD-10-CM | POA: Diagnosis not present

## 2020-02-29 DIAGNOSIS — Z87891 Personal history of nicotine dependence: Secondary | ICD-10-CM | POA: Diagnosis not present

## 2020-02-29 DIAGNOSIS — H903 Sensorineural hearing loss, bilateral: Secondary | ICD-10-CM | POA: Diagnosis not present

## 2020-02-29 DIAGNOSIS — I252 Old myocardial infarction: Secondary | ICD-10-CM | POA: Diagnosis not present

## 2020-03-01 DIAGNOSIS — Z09 Encounter for follow-up examination after completed treatment for conditions other than malignant neoplasm: Secondary | ICD-10-CM | POA: Diagnosis not present

## 2020-03-03 ENCOUNTER — Telehealth: Payer: Self-pay | Admitting: Adult Health

## 2020-03-03 MED ORDER — DIVALPROEX SODIUM 250 MG PO DR TAB: 250.0000 mg | DELAYED_RELEASE_TABLET | Freq: Three times a day (TID) | ORAL | 3 refills | Status: DC

## 2020-03-03 NOTE — Telephone Encounter (Signed)
Gregory Rodgers(daughter on DPR) has called to report pt is still agitated even with the divalproex (DEPAKOTE SPRINKLE) 125 MG capsule.  Daughter wants to know if this can be increased or if something else can be called in.  Please call

## 2020-03-03 NOTE — Telephone Encounter (Signed)
Per review of epic, he was only taking Depakote 250 mg twice daily. When did he increase to 500 mg twice daily? I would advised to speak further to his surgical team in regards to pain management if indicated as this will worsen behaviors. If he is currently taking Depakote 500 mg twice daily, I would be hesitant to increase further due to increased risk of side effects.

## 2020-03-03 NOTE — Telephone Encounter (Signed)
I called daughter back and relayed that will increase depakote 250mg  po tid, new prescription placed, will finished up what they have then will start new tab strength when picks up.  Will call us back if does not help.  geriatrid psychiatry referral next receommendation. She appreciated call back.

## 2020-03-03 NOTE — Telephone Encounter (Signed)
Per daughter, patient continues to have behaviors likely related to dementia which pose a safety concern.  Recommend increasing Depakote from 250 mg twice daily to 250 mg 3 times daily.  If this provides no benefit, referral will be placed to geriatric psychiatry for further assistance due to his age and multiple comorbidities.

## 2020-03-03 NOTE — Addendum Note (Signed)
Addended by: Mal Misty on: 03/03/2020 05:05 PM   Modules accepted: Orders

## 2020-03-03 NOTE — Telephone Encounter (Signed)
I called back daughter back and she clarified the amount of depakote it is 500mg  daily. And the agitation compulsive of moving pushing cart around, hoses. Started prior to surgery.  reccs?
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
## 2020-03-08 DIAGNOSIS — Z09 Encounter for follow-up examination after completed treatment for conditions other than malignant neoplasm: Secondary | ICD-10-CM | POA: Diagnosis not present

## 2020-04-07 ENCOUNTER — Ambulatory Visit (INDEPENDENT_AMBULATORY_CARE_PROVIDER_SITE_OTHER): Payer: PPO

## 2020-04-07 DIAGNOSIS — I255 Ischemic cardiomyopathy: Secondary | ICD-10-CM

## 2020-04-08 LAB — CUP PACEART REMOTE DEVICE CHECK
Battery Remaining Longevity: 37 mo
Battery Remaining Percentage: 53 %
Battery Voltage: 2.93 V
Brady Statistic AP VP Percent: 30 %
Brady Statistic AP VS Percent: 1 %
Brady Statistic AS VP Percent: 68 %
Brady Statistic AS VS Percent: 1.8 %
Brady Statistic RA Percent Paced: 28 %
Date Time Interrogation Session: 20211223020024
HighPow Impedance: 64 Ohm
HighPow Impedance: 64 Ohm
Implantable Lead Implant Date: 20180926
Implantable Lead Implant Date: 20180926
Implantable Lead Implant Date: 20180926
Implantable Lead Location: 753858
Implantable Lead Location: 753859
Implantable Lead Location: 753860
Implantable Lead Model: 7122
Implantable Pulse Generator Implant Date: 20180926
Lead Channel Impedance Value: 410 Ohm
Lead Channel Impedance Value: 430 Ohm
Lead Channel Impedance Value: 640 Ohm
Lead Channel Pacing Threshold Amplitude: 0.5 V
Lead Channel Pacing Threshold Amplitude: 0.5 V
Lead Channel Pacing Threshold Amplitude: 1.75 V
Lead Channel Pacing Threshold Pulse Width: 0.5 ms
Lead Channel Pacing Threshold Pulse Width: 0.5 ms
Lead Channel Pacing Threshold Pulse Width: 0.8 ms
Lead Channel Sensing Intrinsic Amplitude: 1.7 mV
Lead Channel Sensing Intrinsic Amplitude: 9.1 mV
Lead Channel Setting Pacing Amplitude: 2 V
Lead Channel Setting Pacing Amplitude: 2.5 V
Lead Channel Setting Pacing Amplitude: 2.75 V
Lead Channel Setting Pacing Pulse Width: 0.5 ms
Lead Channel Setting Pacing Pulse Width: 0.8 ms
Lead Channel Setting Sensing Sensitivity: 0.5 mV
Pulse Gen Serial Number: 9764296

## 2020-04-13 DIAGNOSIS — B372 Candidiasis of skin and nail: Secondary | ICD-10-CM | POA: Diagnosis not present

## 2020-04-13 DIAGNOSIS — Z6825 Body mass index (BMI) 25.0-25.9, adult: Secondary | ICD-10-CM | POA: Diagnosis not present

## 2020-04-19 ENCOUNTER — Ambulatory Visit: Payer: PPO | Admitting: Sports Medicine

## 2020-04-20 NOTE — Progress Notes (Signed)
Remote ICD transmission.   

## 2020-04-25 DIAGNOSIS — D225 Melanocytic nevi of trunk: Secondary | ICD-10-CM | POA: Diagnosis not present

## 2020-04-25 DIAGNOSIS — L570 Actinic keratosis: Secondary | ICD-10-CM | POA: Diagnosis not present

## 2020-05-06 ENCOUNTER — Ambulatory Visit: Payer: PPO | Admitting: Sports Medicine

## 2020-05-19 DIAGNOSIS — R1031 Right lower quadrant pain: Secondary | ICD-10-CM | POA: Diagnosis not present

## 2020-05-20 DIAGNOSIS — R1031 Right lower quadrant pain: Secondary | ICD-10-CM | POA: Diagnosis not present

## 2020-05-30 ENCOUNTER — Telehealth: Payer: Self-pay | Admitting: Adult Health

## 2020-05-30 DIAGNOSIS — F0151 Vascular dementia with behavioral disturbance: Secondary | ICD-10-CM

## 2020-05-30 DIAGNOSIS — F01518 Vascular dementia, unspecified severity, with other behavioral disturbance: Secondary | ICD-10-CM

## 2020-05-30 NOTE — Telephone Encounter (Signed)
Pt's daughter has called for pt's wife(both on DPR) asking for a call form Jessica,NP re: behavior concerns/issues they are having with pt. Daughter Donnald Garre) states the wife would like to speak directly with Jessica,NP re: behavior issues/concerns for pt.  Ramona or her sister would like to be at the home with wife when Philis Pique calls so she can hear all that her mother is explaining and what is being explained to her. Daughter Donnald Garre can be reached at [phone] to set a time to call her mother so she or her sister are with her.

## 2020-05-30 NOTE — Telephone Encounter (Signed)
Discussed behavioral concerns at prior visit as well as medication adjustments in November.  Long discussion at visit in October regarding typical disease progression including behaviors.  Due to multiple comorbidities and age, it was discussed referral to geriatric psychiatry if behaviors continued or worsened for further management.  I can place this referral but if they would like to further discuss behaviors despite this, request follow-up visit be scheduled (okay for VV if needed) as these type of phone calls can be prolonged

## 2020-05-31 NOTE — Addendum Note (Signed)
Addended by: Mal Misty on: 05/31/2020 12:20 PM   Modules accepted: Orders

## 2020-05-31 NOTE — Telephone Encounter (Signed)
I called daughter, Donnald Garre.  Father wandering, not wanting to take baths, obsesive behavior, gets agitated. Wife wanted medication to knock him out.  (halep him keep calm all the time).  Have looked into facilities for memory care and facilities have beds but no staff at this time.   I mentioned [facility] as option for assisting with finding residences for pts.  I relayed that referral for geriatric psychiatry is another option mentioned previously and would like to procedd with this.  Will hold off on appt with JM/NP (has one in April).  Will call back as needed.

## 2020-05-31 NOTE — Addendum Note (Signed)
Addended by: Brandon Melnick on: 05/31/2020 09:07 AM   Modules accepted: Orders

## 2020-06-21 ENCOUNTER — Ambulatory Visit: Payer: PPO | Admitting: Sports Medicine

## 2020-06-21 ENCOUNTER — Encounter: Payer: Self-pay | Admitting: Sports Medicine

## 2020-06-21 ENCOUNTER — Other Ambulatory Visit: Payer: Self-pay

## 2020-06-21 DIAGNOSIS — I872 Venous insufficiency (chronic) (peripheral): Secondary | ICD-10-CM | POA: Diagnosis not present

## 2020-06-21 DIAGNOSIS — M79675 Pain in left toe(s): Secondary | ICD-10-CM | POA: Diagnosis not present

## 2020-06-21 DIAGNOSIS — B351 Tinea unguium: Secondary | ICD-10-CM | POA: Diagnosis not present

## 2020-06-21 DIAGNOSIS — I739 Peripheral vascular disease, unspecified: Secondary | ICD-10-CM | POA: Diagnosis not present

## 2020-06-21 DIAGNOSIS — M79674 Pain in right toe(s): Secondary | ICD-10-CM | POA: Diagnosis not present

## 2020-06-21 DIAGNOSIS — F0150 Vascular dementia without behavioral disturbance: Secondary | ICD-10-CM

## 2020-06-21 DIAGNOSIS — M21371 Foot drop, right foot: Secondary | ICD-10-CM

## 2020-06-21 DIAGNOSIS — Z8673 Personal history of transient ischemic attack (TIA), and cerebral infarction without residual deficits: Secondary | ICD-10-CM

## 2020-06-21 NOTE — Progress Notes (Signed)
Subjective: Gregory Rodgers is a 87 y.o. male patient seen today in office with complaint of mildly painful thickened and elongated toenails; unable to trim.  Patient denies any changes with medical history since last encounter except wife stating that dementia is getting worse, patient still on Plavix like before but hasnt been able to get him to swallow it.  No other pedal complaints noted. . Patient Active Problem List   Diagnosis Date Noted  . Preop cardiovascular exam 02/25/2020  . Chest pain of uncertain etiology 16/01/9603  . Epigastric discomfort 12/03/2019  . Edema of lower extremity due to peripheral venous insufficiency 08/16/2019  . Nonischemic cardiomyopathy ([place]) 02/13/2019  . ICD (implantable cardioverter-defibrillator) in place 02/13/2019  . Dementia ([place]) 12/17/2018  . Near syncope 11/02/2017  . History of skin cancer 09/23/2017  . Transient speech disturbance 06/01/2017  . 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 Cerebral embolism with cerebral infarction 06/01/2017  . Polymorphic ventricular tachycardia ([place]) 01/09/2017  . NSVT (nonsustained ventricular tachycardia) ([place]) 05/01/2016  . Left bundle branch block (LBBB) on electrocardiogram 02/27/2016  . Dizziness of unknown cause 02/27/2016  . Bradycardia with 41-50 beats per minute 02/27/2016  . Ischemic cardiomyopathy: EF 40-45% reduced to ~30% - post ICD up to 45-50%. 02/27/2016  . Pancytopenia, acquired ([place]) 09/30/2015  . Thrombocytopenia ([place]) 10/01/2014  . 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 CLL (chronic lymphocytic leukemia) ([place]) 06/20/2011  . Hyperlipidemia 06/20/2011  . Essential hypertension 06/20/2011  . Borderline diabetes 06/20/2011  . CAD S/P percutaneous coronary angioplasty 02/26/1985    Current Outpatient Medications on File Prior to Visit  Medication Sig Dispense Refill  . Acetaminophen (TYLENOL 8 HOUR PO) Take by mouth.    . clopidogrel (PLAVIX) 75 MG tablet Take 1  tablet (75 mg total) by mouth daily. 30 tablet 3  . co-enzyme Q-10 30 MG capsule Take 30 mg by mouth daily.    . divalproex (DEPAKOTE) 250 MG DR tablet Take 1 tablet (250 mg total) by mouth 3 (three) times daily. 90 tablet 3  . esomeprazole (NEXIUM) 40 MG capsule Take 40 mg by mouth daily at 12 noon.    . furosemide (LASIX) 40 MG tablet Take 40 mg by mouth on Monday, Tuesday,Thursday, and Fridays only 90 tablet 3  . ibuprofen (ADVIL) 200 MG tablet Take 200 mg by mouth as needed.    . isosorbide mononitrate (IMDUR) 30 MG 24 hr tablet Take 1 tablet (30 mg total) by mouth daily. 90 tablet 3  . lisinopril (ZESTRIL) 5 MG tablet Take 5 mg by mouth daily.    Marland Kitchen MEGARED OMEGA-3 KRILL OIL 500 MG CAPS Take 1 capsule by mouth daily.    . metoprolol succinate (TOPROL-XL) 25 MG 24 hr tablet TAKE ONE TABLET BY MOUTH ONCE DAILY 30 tablet 9  . Multiple Vitamin (MULTIVITAMIN) tablet Take 1 tablet by mouth daily.    . tamsulosin (FLOMAX) 0.4 MG CAPS capsule Take 0.4 mg by mouth daily.     No current facility-administered medications on file prior to visit.    Allergies  Allergen Reactions  . Tape Other (See Comments)    "PLASTIC" TAPE PULLS OFF THE SKIN AND THE REMOVAL OF ALL TAPES BRUISE HIM!!!    Objective: Physical Exam  General: Well developed, nourished, no acute distress, awake, alert and oriented x 3  Vascular: Dorsalis pedis artery 0/4 bilateral, Posterior tibial artery 0/4 bilateral, skin temperature warm to warm proximal to distal bilateral lower extremities, ++ varicosities, 3+ pitting edema bilateral,  diminished pedal hair present bilateral.  Neurological: Gross sensation present via light touch bilateral.   Dermatological: Skin is warm, dry, and supple bilateral, Nails 1-10 are tender, long, thick, and discolored with mild subungal debris, no webspace macerations present bilateral, no open lesions present bilateral, no callus/corns/hyperkeratotic tissue present bilateral. No signs of  infection bilateral.  Musculoskeletal: + foot drop and weakness on right.  Assessment and Plan:  Problem List Items Addressed This Visit      Cardiovascular and Mediastinum   Edema of lower extremity due to peripheral venous insufficiency (Chronic)     Nervous and Auditory   Dementia (HCC)    Other Visit Diagnoses    Pain due to onychomycosis of toenails of both feet    -  Primary   PVD (peripheral vascular disease) (HCC)       Foot drop, right       History of stroke         -Examined patient.  -Re-Discussed treatment options for painful mycotic nails. -Mechanically debrided and reduced mycotic nails with sterile nail nipper and dremel nail file without incident. -Recommend for wife to encourage elevation and meds to help with swelling  -Patient to return in 3 months for follow up evaluation or sooner if symptoms worsen.  Landis Martins, DPM

## 2020-07-07 ENCOUNTER — Telehealth: Payer: Self-pay | Admitting: Internal Medicine
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
## 2020-07-08 NOTE — Telephone Encounter (Signed)
Spoke with patient daughter and the monitor is broken. I have called [employer] and got a new monitor ordered and it will arrive in 7-10 business days and patient daughter is aware. Patient will call once monitor is received

## 2020-07-15 ENCOUNTER — Ambulatory Visit: Payer: PPO | Admitting: Sports Medicine

## 2020-07-25 ENCOUNTER — Ambulatory Visit (INDEPENDENT_AMBULATORY_CARE_PROVIDER_SITE_OTHER)

## 2020-07-25 DIAGNOSIS — I255 Ischemic cardiomyopathy: Secondary | ICD-10-CM | POA: Diagnosis not present

## 2020-07-25 LAB — CUP PACEART REMOTE DEVICE CHECK
Battery Remaining Longevity: 35 mo
Battery Remaining Percentage: 48 %
Battery Voltage: 2.93 V
Brady Statistic AP VP Percent: 25 %
Brady Statistic AP VS Percent: 1 %
Brady Statistic AS VP Percent: 71 %
Brady Statistic AS VS Percent: 2.6 %
Brady Statistic RA Percent Paced: 24 %
Date Time Interrogation Session: 20220409020016
HighPow Impedance: 64 Ohm
HighPow Impedance: 64 Ohm
Implantable Lead Implant Date: 20180926
Implantable Lead Implant Date: 20180926
Implantable Lead Implant Date: 20180926
Implantable Lead Location: 753858
Implantable Lead Location: 753859
Implantable Lead Location: 753860
Implantable Lead Model: 7122
Implantable Pulse Generator Implant Date: 20180926
Lead Channel Impedance Value: 440 Ohm
Lead Channel Impedance Value: 440 Ohm
Lead Channel Impedance Value: 680 Ohm
Lead Channel Pacing Threshold Amplitude: 0.5 V
Lead Channel Pacing Threshold Amplitude: 0.5 V
Lead Channel Pacing Threshold Amplitude: 1.75 V
Lead Channel Pacing Threshold Pulse Width: 0.5 ms
Lead Channel Pacing Threshold Pulse Width: 0.5 ms
Lead Channel Pacing Threshold Pulse Width: 0.8 ms
Lead Channel Sensing Intrinsic Amplitude: 11.7 mV
Lead Channel Sensing Intrinsic Amplitude: 2.2 mV
Lead Channel Setting Pacing Amplitude: 2 V
Lead Channel Setting Pacing Amplitude: 2.5 V
Lead Channel Setting Pacing Amplitude: 2.75 V
Lead Channel Setting Pacing Pulse Width: 0.5 ms
Lead Channel Setting Pacing Pulse Width: 0.8 ms
Lead Channel Setting Sensing Sensitivity: 0.5 mV
Pulse Gen Serial Number: 9764296

## 2020-07-26 NOTE — Telephone Encounter (Signed)
Spoke with patient daughter and she has gotten the monitor and they have received the home monitor

## 2020-08-04 ENCOUNTER — Ambulatory Visit: Payer: PPO | Admitting: Adult Health

## 2020-08-05 NOTE — Progress Notes (Signed)
Remote ICD transmission.   

## 2020-08-30 ENCOUNTER — Telehealth: Payer: PPO | Admitting: Internal Medicine

## 2020-08-30 DIAGNOSIS — Z66 Do not resuscitate: Secondary | ICD-10-CM | POA: Diagnosis not present

## 2020-08-30 DIAGNOSIS — K5909 Other constipation: Secondary | ICD-10-CM | POA: Diagnosis not present

## 2020-08-30 DIAGNOSIS — F0151 Vascular dementia with behavioral disturbance: Secondary | ICD-10-CM | POA: Diagnosis not present

## 2020-08-30 DIAGNOSIS — M7989 Other specified soft tissue disorders: Secondary | ICD-10-CM | POA: Diagnosis not present

## 2020-08-30 DIAGNOSIS — F0391 Unspecified dementia with behavioral disturbance: Secondary | ICD-10-CM | POA: Diagnosis not present

## 2020-08-30 DIAGNOSIS — Z20822 Contact with and (suspected) exposure to covid-19: Secondary | ICD-10-CM | POA: Diagnosis not present

## 2020-08-30 DIAGNOSIS — Z8673 Personal history of transient ischemic attack (TIA), and cerebral infarction without residual deficits: Secondary | ICD-10-CM | POA: Diagnosis not present

## 2020-08-30 DIAGNOSIS — Z111 Encounter for screening for respiratory tuberculosis: Secondary | ICD-10-CM | POA: Diagnosis not present

## 2020-08-30 DIAGNOSIS — I10 Essential (primary) hypertension: Secondary | ICD-10-CM | POA: Diagnosis not present

## 2020-09-06 ENCOUNTER — Telehealth: Payer: Self-pay | Admitting: Emergency Medicine

## 2020-09-06 ENCOUNTER — Telehealth (INDEPENDENT_AMBULATORY_CARE_PROVIDER_SITE_OTHER): Payer: Medicare Other | Admitting: Internal Medicine

## 2020-09-06 ENCOUNTER — Other Ambulatory Visit: Payer: Self-pay

## 2020-09-06 VITALS — BP 144/64 | HR 58 | Ht 70.0 in | Wt 175.0 lb

## 2020-09-06 DIAGNOSIS — I428 Other cardiomyopathies: Secondary | ICD-10-CM

## 2020-09-06 DIAGNOSIS — Z95810 Presence of automatic (implantable) cardiac defibrillator: Secondary | ICD-10-CM

## 2020-09-06 NOTE — Patient Instructions (Signed)
Medication Instructions:  Your physician recommends that you continue on your current medications as directed. Please refer to the Current Medication list given to you today.  Labwork: None ordered.  Testing/Procedures: None ordered.  Follow-Up: Your physician wants you to follow-up in: as needed with [name], MD   NO REMOTE MONITORING  Any Other Special Instructions Will Be Listed Below (If Applicable).  If you need a refill on your cardiac medications before your next appointment, please call your pharmacy.

## 2020-09-06 NOTE — Telephone Encounter (Signed)
Appointments have been cancelled in [place] for home remotes. Patient status has been changed to inactive in [place] per DR. Taylor's orders. Spoke with patient's DPR Mardene Celeste and provided the number to [facility] [phone] and advised her to contact them for a return kit.

## 2020-09-06 NOTE — Progress Notes (Signed)
Electrophysiology TeleHealth Note   Due to national recommendations of social distancing due to COVID 19, an audio/video telehealth visit is felt to be most appropriate for this patient at this time.  See MyChart message from today for the patient's consent to telehealth for [name].   Date:  09/06/2020   ID:  Gregory Rodgers, DOB 30-Nov-1927, MRN [id number]  Location: patient's home  Provider location: [address], [place]  Evaluation Performed: Follow-up visit  PCP:  Earlyne Iba, NP  Cardiologist:  Glenetta Hew, MD  Electrophysiologist:  Dr Lovena Le  Chief Complaint:  He has developed severe dementia and sleeps all the time.  History of Present Illness:    Gregory Rodgers is a 87 y.o. male who presents via audio/video conferencing for a telehealth visit today. He is a pleasant 87 yo man with progressive dementia. He has had the tachy therapies of his device turned off. He is going to a memory care SNF tomorrow. He has not had chest pain or sob. His problems stem from progressive dementia. He has had a reduction in his oral intake. He is no longer able to ambulate independently.  Past Medical History:  Diagnosis Date  . Basal cell carcinoma    "numerous places"  . Borderline diabetes   . Cardiomyopathy, dilated ([place]) 03/2016   a) EF ~30% with diffuse HK (not simply ICM) ? partially due to LBBB (septal-lateral dyssynchrony). b) f/u Echo after BiV ICD = EF 45-50%; ICD TACHY-THERAPY D/C'D 11/10 per pt/Family deisre  . CLL (chronic lymphocytic leukemia) ([place]) 09/2009   stage 0; on observation  . Coronary artery disease involving native heart without angina pectoris    PTCA of RCA in setting of an MI in 1986-87; no follow-up study since available.  . Essential hypertension   . History of kidney stones   . Hyperlipidemia with target LDL less than 70    With known CAD  . ICD (implantable cardioverter-defibrillator) in place    ICD placed for VT ->  as of February 24, 2020, tachyarrhythmia therapy turned off.  Now CRT-P Mode only.  (per patient, family wishes)  . 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 Left bundle branch block (LBBB) on electrocardiogram    Noted as far back as 2008.  . 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 BASAL CELL CARCINOMA EXCISION     "numerous"  . BIV ICD INSERTION CRT-D N/A 01/09/2017   Procedure: BIV ICD INSERTION CRT-D;  Surgeon: Evans Lance, MD;  Location: [facility];  Service: Cardiovascular;  Laterality: N/A;  . CATARACT EXTRACTION W/ INTRAOCULAR LENS  IMPLANT, BILATERAL Bilateral   . CORONARY ANGIOPLASTY  1986/1987  . defibllator    . dual pacemaker    . EP IMPLANTABLE DEVICE N/A 05/10/2016   Procedure: Loop Recorder Insertion;  Surgeon: Evans Lance, MD;  Location: [facility];  Service: Cardiovascular;  Laterality: N/A;  slight blood drop on dressing  . JOINT REPLACEMENT    . MOHS SURGERY Left 01/02/2017   ear  . REPLACEMENT TOTAL KNEE Left   . TEE WITHOUT CARDIOVERSION N/A 06/03/2017   Procedure: TRANSESOPHAGEAL ECHOCARDIOGRAM (TEE) - cancelled -   . TRANSTHORACIC ECHOCARDIOGRAM  03/2016   Normal LV size with mild LV hypertrophy. EF 30%. Diffuse  hypokinesis wtih septal-lateral dyssynchrony. Normal RV size and systolic function. Mild MR and mild AI.  [facility] TRANSTHORACIC ECHOCARDIOGRAM  06/03/2017   for CVA evaluation.  Diffuse hypokinesis EF 45 to 50% with moderate focal basal septal hypertrophy.  Moderate aortic insufficiency.  Large PFO with positive bubble study. -Possible source of embolus.  Ascending aortic aneurysm unchanged.    Current Outpatient Medications  Medication Sig Dispense Refill  . lactulose (CHRONULAC) 10 GM/15ML solution Take 30 mLs by mouth daily.    . QUEtiapine (SEROQUEL) 100 MG tablet Take 100 mg by mouth 2 (two) times daily.      No current facility-administered medications for this visit.    Allergies:   Tape   Social History:  The patient  reports that he has never smoked. He has never used smokeless tobacco. He reports that he does not drink alcohol and does not use drugs.   Family History:  The patient's family history is not on file.   ROS:  Please see the history of present illness.   All other systems are personally reviewed and negative.    Exam:    Vital Signs:  BP (!) 144/64   Pulse (!) 58   Ht 5\' 10"  (1.778 m)   Wt 175 lb (79.4 kg)   BMI 25.11 kg/m    Labs/Other Tests and Data Reviewed:    Recent Labs: No results found for requested labs within last 8760 hours.   Wt Readings from Last 3 Encounters:  09/06/20 175 lb (79.4 kg)  02/25/20 177 lb (80.3 kg)  02/24/20 177 lb (80.3 kg)     Other studies personally reviewed:  Last device remote is reviewed from [facility] PDF dated 4/22 which reveals normal device function, no arrhythmias and his tachy therapies are off.   ASSESSMENT & PLAN:    1.  Chronic systolic heart failure - he is not limited by sob. He has an EF of 45%. He is off of all of his CHF meds. 2. ICD - his biv device is working normally. He has the tachy therapies turned off. His biv pacing remains on and he is pacing all of the time.  3. Dementia - he has progressed and will be going to memory care snf. He will remain on seroquel.  Follow-up:  n/a Next remote: none  Current medicines are reviewed at length with the patient today.   The patient does not have concerns regarding his medicines.  The following changes were made today:  none  Labs/ tests ordered today include: none No orders of the defined types were placed in this encounter.    Patient Risk:  after full review of this patients clinical status, I feel that they are at moderate risk at this time.  Today, I have spent 21 minutes with the patient's family with telehealth technology discussing all of the  above .    Signed, [name], MD  09/06/2020 8:23 AM     [place] [address] [phone] (office) [phone] (fax)

## 2020-09-21 ENCOUNTER — Ambulatory Visit: Payer: PPO | Admitting: Sports Medicine

## 2021-04-16 DEATH — deceased
